# Patient Record
Sex: Female | Born: 1975 | Race: Black or African American | Hispanic: No | Marital: Married | Smoking: Never smoker
Health system: Southern US, Community
[De-identification: ages and names within clinical notes are randomized; demographics above are authoritative.]

## PROBLEM LIST (undated history)

## (undated) DIAGNOSIS — K760 Fatty (change of) liver, not elsewhere classified: Secondary | ICD-10-CM

## (undated) DIAGNOSIS — E669 Obesity, unspecified: Secondary | ICD-10-CM

## (undated) DIAGNOSIS — A749 Chlamydial infection, unspecified: Secondary | ICD-10-CM

## (undated) DIAGNOSIS — Z8759 Personal history of other complications of pregnancy, childbirth and the puerperium: Secondary | ICD-10-CM

## (undated) DIAGNOSIS — E0500 Thyrotoxicosis with diffuse goiter without thyrotoxic crisis or storm: Secondary | ICD-10-CM

## (undated) HISTORY — PX: WISDOM TOOTH EXTRACTION: SHX21

## (undated) HISTORY — PX: THERAPEUTIC ABORTION: SHX798

## (undated) HISTORY — DX: Fatty (change of) liver, not elsewhere classified: K76.0

## (undated) HISTORY — PX: SLEEVE GASTROPLASTY: SHX1101

---

## 2000-01-19 ENCOUNTER — Other Ambulatory Visit: Admission: RE | Admit: 2000-01-19 | Discharge: 2000-01-19 | Payer: Self-pay | Admitting: Gynecology

## 2000-01-20 ENCOUNTER — Encounter: Admission: RE | Admit: 2000-01-20 | Discharge: 2000-04-19 | Payer: Self-pay | Admitting: Gynecology

## 2000-06-17 ENCOUNTER — Encounter (HOSPITAL_COMMUNITY): Admission: RE | Admit: 2000-06-17 | Discharge: 2000-08-06 | Payer: Self-pay | Admitting: Gynecology

## 2000-08-06 ENCOUNTER — Inpatient Hospital Stay (HOSPITAL_COMMUNITY): Admission: AD | Admit: 2000-08-06 | Discharge: 2000-08-09 | Payer: Self-pay | Admitting: *Deleted

## 2000-09-22 ENCOUNTER — Other Ambulatory Visit: Admission: RE | Admit: 2000-09-22 | Discharge: 2000-09-22 | Payer: Self-pay | Admitting: Gynecology

## 2002-04-25 ENCOUNTER — Inpatient Hospital Stay (HOSPITAL_COMMUNITY): Admission: AD | Admit: 2002-04-25 | Discharge: 2002-04-25 | Payer: Self-pay | Admitting: Gynecology

## 2002-04-25 ENCOUNTER — Encounter (INDEPENDENT_AMBULATORY_CARE_PROVIDER_SITE_OTHER): Payer: Self-pay | Admitting: Specialist

## 2003-06-01 ENCOUNTER — Encounter: Payer: Self-pay | Admitting: Obstetrics and Gynecology

## 2003-06-01 ENCOUNTER — Inpatient Hospital Stay (HOSPITAL_COMMUNITY): Admission: AD | Admit: 2003-06-01 | Discharge: 2003-06-01 | Payer: Self-pay | Admitting: *Deleted

## 2003-06-05 ENCOUNTER — Inpatient Hospital Stay (HOSPITAL_COMMUNITY): Admission: AD | Admit: 2003-06-05 | Discharge: 2003-06-05 | Payer: Self-pay | Admitting: *Deleted

## 2003-06-08 ENCOUNTER — Inpatient Hospital Stay (HOSPITAL_COMMUNITY): Admission: AD | Admit: 2003-06-08 | Discharge: 2003-06-08 | Payer: Self-pay | Admitting: Obstetrics & Gynecology

## 2004-03-25 ENCOUNTER — Emergency Department (HOSPITAL_COMMUNITY): Admission: AD | Admit: 2004-03-25 | Discharge: 2004-03-25 | Payer: Self-pay | Admitting: Family Medicine

## 2004-10-30 ENCOUNTER — Other Ambulatory Visit: Admission: RE | Admit: 2004-10-30 | Discharge: 2004-10-30 | Payer: Self-pay | Admitting: Gynecology

## 2005-05-02 ENCOUNTER — Emergency Department (HOSPITAL_COMMUNITY): Admission: EM | Admit: 2005-05-02 | Discharge: 2005-05-02 | Payer: Self-pay | Admitting: Family Medicine

## 2005-07-12 ENCOUNTER — Emergency Department (HOSPITAL_COMMUNITY): Admission: EM | Admit: 2005-07-12 | Discharge: 2005-07-12 | Payer: Self-pay | Admitting: Family Medicine

## 2006-02-01 ENCOUNTER — Inpatient Hospital Stay (HOSPITAL_COMMUNITY): Admission: AD | Admit: 2006-02-01 | Discharge: 2006-02-03 | Payer: Self-pay | Admitting: Obstetrics & Gynecology

## 2006-02-01 ENCOUNTER — Ambulatory Visit: Payer: Self-pay | Admitting: Obstetrics & Gynecology

## 2006-10-24 ENCOUNTER — Emergency Department (HOSPITAL_COMMUNITY): Admission: EM | Admit: 2006-10-24 | Discharge: 2006-10-24 | Payer: Self-pay | Admitting: *Deleted

## 2008-01-04 ENCOUNTER — Emergency Department (HOSPITAL_COMMUNITY): Admission: EM | Admit: 2008-01-04 | Discharge: 2008-01-04 | Payer: Self-pay | Admitting: Family Medicine

## 2008-01-13 ENCOUNTER — Encounter (INDEPENDENT_AMBULATORY_CARE_PROVIDER_SITE_OTHER): Payer: Self-pay | Admitting: Nurse Practitioner

## 2008-01-13 ENCOUNTER — Ambulatory Visit: Payer: Self-pay | Admitting: Internal Medicine

## 2008-01-13 LAB — CONVERTED CEMR LAB
Albumin: 4.5 g/dL (ref 3.5–5.2)
CO2: 17 meq/L — ABNORMAL LOW (ref 19–32)
Calcium: 10 mg/dL (ref 8.4–10.5)
Chloride: 104 meq/L (ref 96–112)
Glucose, Bld: 229 mg/dL — ABNORMAL HIGH (ref 70–99)
Lymphocytes Relative: 41 % (ref 12–46)
Lymphs Abs: 4.5 10*3/uL — ABNORMAL HIGH (ref 0.7–4.0)
Monocytes Relative: 5 % (ref 3–12)
Neutrophils Relative %: 49 % (ref 43–77)
Platelets: 297 10*3/uL (ref 150–400)
Potassium: 4 meq/L (ref 3.5–5.3)
RBC: 5.84 M/uL — ABNORMAL HIGH (ref 3.87–5.11)
Sodium: 139 meq/L (ref 135–145)
TSH: 0.634 microintl units/mL (ref 0.350–5.50)
Total Protein: 8 g/dL (ref 6.0–8.3)
WBC: 10.8 10*3/uL — ABNORMAL HIGH (ref 4.0–10.5)

## 2008-01-16 ENCOUNTER — Encounter (INDEPENDENT_AMBULATORY_CARE_PROVIDER_SITE_OTHER): Payer: Self-pay | Admitting: Nurse Practitioner

## 2008-01-18 ENCOUNTER — Emergency Department (HOSPITAL_COMMUNITY): Admission: EM | Admit: 2008-01-18 | Discharge: 2008-01-18 | Payer: Self-pay | Admitting: Family Medicine

## 2008-01-20 ENCOUNTER — Emergency Department (HOSPITAL_COMMUNITY): Admission: EM | Admit: 2008-01-20 | Discharge: 2008-01-21 | Payer: Self-pay | Admitting: Internal Medicine

## 2008-01-24 ENCOUNTER — Emergency Department (HOSPITAL_COMMUNITY): Admission: EM | Admit: 2008-01-24 | Discharge: 2008-01-25 | Payer: Self-pay | Admitting: Emergency Medicine

## 2008-02-08 ENCOUNTER — Ambulatory Visit: Payer: Self-pay | Admitting: Family Medicine

## 2008-02-08 ENCOUNTER — Encounter (INDEPENDENT_AMBULATORY_CARE_PROVIDER_SITE_OTHER): Payer: Self-pay | Admitting: Nurse Practitioner

## 2008-02-08 LAB — CONVERTED CEMR LAB
LDL Cholesterol: 90 mg/dL (ref 0–99)
Triglycerides: 115 mg/dL (ref <150)
VLDL: 23 mg/dL (ref 0–40)

## 2008-03-14 ENCOUNTER — Encounter (INDEPENDENT_AMBULATORY_CARE_PROVIDER_SITE_OTHER): Payer: Self-pay | Admitting: Nurse Practitioner

## 2008-03-14 ENCOUNTER — Ambulatory Visit: Payer: Self-pay | Admitting: Internal Medicine

## 2008-03-14 LAB — CONVERTED CEMR LAB
ALT: 11 units/L (ref 0–35)
AST: 11 units/L (ref 0–37)
Calcium: 9.7 mg/dL (ref 8.4–10.5)
Chloride: 104 meq/L (ref 96–112)
Creatinine, Ser: 0.59 mg/dL (ref 0.40–1.20)
Potassium: 4.1 meq/L (ref 3.5–5.3)
Sodium: 140 meq/L (ref 135–145)
Total CHOL/HDL Ratio: 3.1

## 2008-05-11 ENCOUNTER — Emergency Department (HOSPITAL_COMMUNITY): Admission: EM | Admit: 2008-05-11 | Discharge: 2008-05-11 | Payer: Self-pay | Admitting: Emergency Medicine

## 2008-11-07 ENCOUNTER — Emergency Department (HOSPITAL_COMMUNITY): Admission: EM | Admit: 2008-11-07 | Discharge: 2008-11-07 | Payer: Self-pay | Admitting: Emergency Medicine

## 2009-01-10 ENCOUNTER — Emergency Department (HOSPITAL_COMMUNITY): Admission: EM | Admit: 2009-01-10 | Discharge: 2009-01-10 | Payer: Self-pay | Admitting: Family Medicine

## 2011-01-31 ENCOUNTER — Inpatient Hospital Stay (HOSPITAL_COMMUNITY)
Admission: AD | Admit: 2011-01-31 | Discharge: 2011-01-31 | Disposition: A | Discharge status: Home / Self Care | Payer: Medicaid Other | Source: Ambulatory Visit | Attending: Obstetrics and Gynecology | Admitting: Obstetrics and Gynecology

## 2011-01-31 DIAGNOSIS — O210 Mild hyperemesis gravidarum: Secondary | ICD-10-CM

## 2011-01-31 DIAGNOSIS — R197 Diarrhea, unspecified: Secondary | ICD-10-CM | POA: Insufficient documentation

## 2011-01-31 LAB — COMPREHENSIVE METABOLIC PANEL
ALT: 14 U/L (ref 0–35)
Albumin: 3.4 g/dL — ABNORMAL LOW (ref 3.5–5.2)
Alkaline Phosphatase: 77 U/L (ref 39–117)
Glucose, Bld: 268 mg/dL — ABNORMAL HIGH (ref 70–99)
Potassium: 3.7 mEq/L (ref 3.5–5.1)
Sodium: 135 mEq/L (ref 135–145)
Total Protein: 7.3 g/dL (ref 6.0–8.3)

## 2011-01-31 LAB — URINALYSIS, ROUTINE W REFLEX MICROSCOPIC
Ketones, ur: 15 mg/dL — AB
Protein, ur: NEGATIVE mg/dL
Urine Glucose, Fasting: >1000 mg/dL — AB

## 2011-01-31 LAB — POCT PREGNANCY, URINE: Preg Test, Ur: POSITIVE

## 2011-01-31 LAB — CBC
MCV: 76.2 fL — ABNORMAL LOW (ref 78.0–100.0)
Platelets: 292 10*3/uL (ref 150–400)
RBC: 5.05 MIL/uL (ref 3.87–5.11)
WBC: 11.8 10*3/uL — ABNORMAL HIGH (ref 4.0–10.5)

## 2011-01-31 LAB — URINE MICROSCOPIC-ADD ON

## 2011-02-01 LAB — URINE CULTURE
Colony Count: >=100000
Culture  Setup Time: 201202111852

## 2011-02-08 ENCOUNTER — Inpatient Hospital Stay (HOSPITAL_COMMUNITY)
Admission: AD | Admit: 2011-02-08 | Discharge: 2011-02-09 | Disposition: A | Discharge status: Home / Self Care | Payer: Medicaid Other | Source: Ambulatory Visit | Attending: Obstetrics & Gynecology | Admitting: Obstetrics & Gynecology

## 2011-02-08 DIAGNOSIS — E119 Type 2 diabetes mellitus without complications: Secondary | ICD-10-CM | POA: Insufficient documentation

## 2011-02-08 DIAGNOSIS — O24919 Unspecified diabetes mellitus in pregnancy, unspecified trimester: Secondary | ICD-10-CM | POA: Insufficient documentation

## 2011-02-08 DIAGNOSIS — O210 Mild hyperemesis gravidarum: Secondary | ICD-10-CM | POA: Insufficient documentation

## 2011-02-08 LAB — URINALYSIS, ROUTINE W REFLEX MICROSCOPIC
Hgb urine dipstick: NEGATIVE
Ketones, ur: 40 mg/dL — AB
Protein, ur: 100 mg/dL — AB
Urine Glucose, Fasting: 250 mg/dL — AB

## 2011-02-08 LAB — URINE MICROSCOPIC-ADD ON

## 2011-02-09 LAB — COMPREHENSIVE METABOLIC PANEL
ALT: 16 U/L (ref 0–35)
AST: 17 U/L (ref 0–37)
Albumin: 3.5 g/dL (ref 3.5–5.2)
Alkaline Phosphatase: 72 U/L (ref 39–117)
GFR calc Af Amer: >60 mL/min (ref >60)
Glucose, Bld: 255 mg/dL — ABNORMAL HIGH (ref 70–99)
Potassium: 3.4 mEq/L — ABNORMAL LOW (ref 3.5–5.1)
Sodium: 136 mEq/L (ref 135–145)
Total Protein: 7.5 g/dL (ref 6.0–8.3)

## 2011-02-09 LAB — CBC
HCT: 38.5 % (ref 36.0–46.0)
RDW: 15 % (ref 11.5–15.5)
WBC: 14.1 10*3/uL — ABNORMAL HIGH (ref 4.0–10.5)

## 2011-02-10 ENCOUNTER — Observation Stay (HOSPITAL_COMMUNITY): Payer: Medicaid Other

## 2011-02-10 ENCOUNTER — Inpatient Hospital Stay (HOSPITAL_COMMUNITY): Payer: Medicaid Other

## 2011-02-10 ENCOUNTER — Encounter (HOSPITAL_COMMUNITY): Payer: Self-pay | Admitting: Radiology

## 2011-02-10 ENCOUNTER — Inpatient Hospital Stay (HOSPITAL_COMMUNITY)
Admission: AD | Admit: 2011-02-10 | Discharge: 2011-02-18 | DRG: 781 | Disposition: A | Discharge status: Home Health | Payer: Medicaid Other | Source: Ambulatory Visit | Attending: Obstetrics & Gynecology | Admitting: Obstetrics & Gynecology

## 2011-02-10 DIAGNOSIS — O10019 Pre-existing essential hypertension complicating pregnancy, unspecified trimester: Secondary | ICD-10-CM | POA: Diagnosis present

## 2011-02-10 DIAGNOSIS — E876 Hypokalemia: Secondary | ICD-10-CM | POA: Diagnosis present

## 2011-02-10 DIAGNOSIS — O210 Mild hyperemesis gravidarum: Principal | ICD-10-CM | POA: Diagnosis present

## 2011-02-10 DIAGNOSIS — O24919 Unspecified diabetes mellitus in pregnancy, unspecified trimester: Secondary | ICD-10-CM | POA: Diagnosis present

## 2011-02-10 DIAGNOSIS — E119 Type 2 diabetes mellitus without complications: Secondary | ICD-10-CM | POA: Diagnosis present

## 2011-02-10 LAB — URINALYSIS, ROUTINE W REFLEX MICROSCOPIC
Hgb urine dipstick: NEGATIVE
Leukocytes, UA: NEGATIVE
Protein, ur: 100 mg/dL — AB
Specific Gravity, Urine: >1.03 — ABNORMAL HIGH (ref 1.005–1.030)
Urine Glucose, Fasting: 500 mg/dL — AB
Urobilinogen, UA: 0.2 mg/dL (ref 0.0–1.0)

## 2011-02-10 LAB — CBC
HCT: 42.3 % (ref 36.0–46.0)
Hemoglobin: 14.3 g/dL (ref 12.0–15.0)
MCH: 25.7 pg — ABNORMAL LOW (ref 26.0–34.0)
MCHC: 33.8 g/dL (ref 30.0–36.0)
MCV: 76.1 fL — ABNORMAL LOW (ref 78.0–100.0)
Platelets: 337 K/uL (ref 150–400)
RBC: 5.56 MIL/uL — ABNORMAL HIGH (ref 3.87–5.11)
RDW: 14.4 % (ref 11.5–15.5)
WBC: 18 K/uL — ABNORMAL HIGH (ref 4.0–10.5)

## 2011-02-10 LAB — GLUCOSE, CAPILLARY
Glucose-Capillary: 256 mg/dL — ABNORMAL HIGH (ref 70–99)
Glucose-Capillary: 259 mg/dL — ABNORMAL HIGH (ref 70–99)
Glucose-Capillary: 242 mg/dL — ABNORMAL HIGH (ref 70–99)
Glucose-Capillary: 225 mg/dL — ABNORMAL HIGH (ref 70–99)
Glucose-Capillary: 185 mg/dL — ABNORMAL HIGH (ref 70–99)
Glucose-Capillary: 148 mg/dL — ABNORMAL HIGH (ref 70–99)
Glucose-Capillary: 182 mg/dL — ABNORMAL HIGH (ref 70–99)
Glucose-Capillary: 176 mg/dL — ABNORMAL HIGH (ref 70–99)

## 2011-02-10 LAB — COMPREHENSIVE METABOLIC PANEL WITH GFR
ALT: 18 U/L (ref 0–35)
AST: 16 U/L (ref 0–37)
Albumin: 3.9 g/dL (ref 3.5–5.2)
Alkaline Phosphatase: 82 U/L (ref 39–117)
BUN: 9 mg/dL (ref 6–23)
CO2: 18 meq/L — ABNORMAL LOW (ref 19–32)
Calcium: 9.6 mg/dL (ref 8.4–10.5)
Chloride: 101 meq/L (ref 96–112)
Creatinine, Ser: 0.79 mg/dL (ref 0.4–1.2)
GFR calc non Af Amer: >60 mL/min
Glucose, Bld: 284 mg/dL — ABNORMAL HIGH (ref 70–99)
Potassium: 3.4 meq/L — ABNORMAL LOW (ref 3.5–5.1)
Sodium: 134 meq/L — ABNORMAL LOW (ref 135–145)
Total Bilirubin: 1.6 mg/dL — ABNORMAL HIGH (ref 0.3–1.2)
Total Protein: 8.5 g/dL — ABNORMAL HIGH (ref 6.0–8.3)

## 2011-02-10 LAB — URINE MICROSCOPIC-ADD ON

## 2011-02-11 LAB — GLUCOSE, CAPILLARY
Glucose-Capillary: 112 mg/dL — ABNORMAL HIGH (ref 70–99)
Glucose-Capillary: 112 mg/dL — ABNORMAL HIGH (ref 70–99)
Glucose-Capillary: 114 mg/dL — ABNORMAL HIGH (ref 70–99)
Glucose-Capillary: 121 mg/dL — ABNORMAL HIGH (ref 70–99)
Glucose-Capillary: 125 mg/dL — ABNORMAL HIGH (ref 70–99)
Glucose-Capillary: 107 mg/dL — ABNORMAL HIGH (ref 70–99)
Glucose-Capillary: 90 mg/dL (ref 70–99)
Glucose-Capillary: 88 mg/dL (ref 70–99)
Glucose-Capillary: 109 mg/dL — ABNORMAL HIGH (ref 70–99)
Glucose-Capillary: 97 mg/dL (ref 70–99)
Glucose-Capillary: 103 mg/dL — ABNORMAL HIGH (ref 70–99)
Glucose-Capillary: 106 mg/dL — ABNORMAL HIGH (ref 70–99)

## 2011-02-12 LAB — GLUCOSE, CAPILLARY
Glucose-Capillary: 103 mg/dL — ABNORMAL HIGH (ref 70–99)
Glucose-Capillary: 108 mg/dL — ABNORMAL HIGH (ref 70–99)
Glucose-Capillary: 124 mg/dL — ABNORMAL HIGH (ref 70–99)
Glucose-Capillary: 83 mg/dL (ref 70–99)
Glucose-Capillary: 83 mg/dL (ref 70–99)
Glucose-Capillary: 92 mg/dL (ref 70–99)
Glucose-Capillary: 92 mg/dL (ref 70–99)
Glucose-Capillary: 94 mg/dL (ref 70–99)
Glucose-Capillary: 97 mg/dL (ref 70–99)
Glucose-Capillary: 98 mg/dL (ref 70–99)
Glucose-Capillary: 99 mg/dL (ref 70–99)
Glucose-Capillary: 99 mg/dL (ref 70–99)
Glucose-Capillary: 99 mg/dL (ref 70–99)

## 2011-02-12 LAB — BASIC METABOLIC PANEL
BUN: 4 mg/dL — ABNORMAL LOW (ref 6–23)
BUN: 3 mg/dL — ABNORMAL LOW (ref 6–23)
CO2: 23 mEq/L (ref 19–32)
CO2: 19 mEq/L (ref 19–32)
Calcium: 8.1 mg/dL — ABNORMAL LOW (ref 8.4–10.5)
Chloride: 115 mEq/L — ABNORMAL HIGH (ref 96–112)
Chloride: 120 mEq/L — ABNORMAL HIGH (ref 96–112)
Creatinine, Ser: 0.55 mg/dL (ref 0.4–1.2)
Creatinine, Ser: 0.52 mg/dL (ref 0.4–1.2)
Glucose, Bld: 83 mg/dL (ref 70–99)
Glucose, Bld: 86 mg/dL (ref 70–99)
Potassium: 2.8 mEq/L — ABNORMAL LOW (ref 3.5–5.1)

## 2011-02-13 LAB — BASIC METABOLIC PANEL
BUN: 1 mg/dL — ABNORMAL LOW (ref 6–23)
Calcium: 7.6 mg/dL — ABNORMAL LOW (ref 8.4–10.5)
Chloride: 115 mEq/L — ABNORMAL HIGH (ref 96–112)
Creatinine, Ser: 0.48 mg/dL (ref 0.4–1.2)
GFR calc non Af Amer: >60 mL/min (ref >60)
GFR calc non Af Amer: >60 mL/min (ref >60)
Glucose, Bld: 95 mg/dL (ref 70–99)
Potassium: 2.9 mEq/L — ABNORMAL LOW (ref 3.5–5.1)
Potassium: 2.9 mEq/L — ABNORMAL LOW (ref 3.5–5.1)
Sodium: 141 mEq/L (ref 135–145)

## 2011-02-13 LAB — GLUCOSE, CAPILLARY
Glucose-Capillary: 84 mg/dL (ref 70–99)
Glucose-Capillary: 90 mg/dL (ref 70–99)
Glucose-Capillary: 101 mg/dL — ABNORMAL HIGH (ref 70–99)
Glucose-Capillary: 95 mg/dL (ref 70–99)
Glucose-Capillary: 100 mg/dL — ABNORMAL HIGH (ref 70–99)
Glucose-Capillary: 93 mg/dL (ref 70–99)
Glucose-Capillary: 91 mg/dL (ref 70–99)
Glucose-Capillary: 86 mg/dL (ref 70–99)
Glucose-Capillary: 97 mg/dL (ref 70–99)
Glucose-Capillary: 97 mg/dL (ref 70–99)
Glucose-Capillary: 93 mg/dL (ref 70–99)
Glucose-Capillary: 107 mg/dL — ABNORMAL HIGH (ref 70–99)
Glucose-Capillary: 94 mg/dL (ref 70–99)

## 2011-02-13 LAB — MAGNESIUM: Magnesium: 1.5 mg/dL (ref 1.5–2.5)

## 2011-02-14 LAB — GLUCOSE, CAPILLARY
Glucose-Capillary: 109 mg/dL — ABNORMAL HIGH (ref 70–99)
Glucose-Capillary: 118 mg/dL — ABNORMAL HIGH (ref 70–99)
Glucose-Capillary: 128 mg/dL — ABNORMAL HIGH (ref 70–99)
Glucose-Capillary: 155 mg/dL — ABNORMAL HIGH (ref 70–99)
Glucose-Capillary: 88 mg/dL (ref 70–99)
Glucose-Capillary: 89 mg/dL (ref 70–99)
Glucose-Capillary: 92 mg/dL (ref 70–99)
Glucose-Capillary: 95 mg/dL (ref 70–99)
Glucose-Capillary: 98 mg/dL (ref 70–99)
Glucose-Capillary: 98 mg/dL (ref 70–99)

## 2011-02-14 LAB — COMPREHENSIVE METABOLIC PANEL
ALT: 11 U/L (ref 0–35)
AST: 11 U/L (ref 0–37)
Alkaline Phosphatase: 63 U/L (ref 39–117)
CO2: 20 mEq/L (ref 19–32)
GFR calc Af Amer: >60 mL/min (ref >60)
Glucose, Bld: 98 mg/dL (ref 70–99)
Potassium: 3.1 mEq/L — ABNORMAL LOW (ref 3.5–5.1)
Sodium: 138 mEq/L (ref 135–145)
Total Protein: 5.4 g/dL — ABNORMAL LOW (ref 6.0–8.3)

## 2011-02-14 LAB — MAGNESIUM: Magnesium: 1.1 mg/dL — ABNORMAL LOW (ref 1.5–2.5)

## 2011-02-15 DIAGNOSIS — O210 Mild hyperemesis gravidarum: Secondary | ICD-10-CM

## 2011-02-15 DIAGNOSIS — E876 Hypokalemia: Secondary | ICD-10-CM

## 2011-02-15 DIAGNOSIS — O24919 Unspecified diabetes mellitus in pregnancy, unspecified trimester: Secondary | ICD-10-CM

## 2011-02-15 DIAGNOSIS — E119 Type 2 diabetes mellitus without complications: Secondary | ICD-10-CM

## 2011-02-15 LAB — GLUCOSE, CAPILLARY
Glucose-Capillary: 128 mg/dL — ABNORMAL HIGH (ref 70–99)
Glucose-Capillary: 122 mg/dL — ABNORMAL HIGH (ref 70–99)
Glucose-Capillary: 114 mg/dL — ABNORMAL HIGH (ref 70–99)
Glucose-Capillary: 106 mg/dL — ABNORMAL HIGH (ref 70–99)
Glucose-Capillary: 98 mg/dL (ref 70–99)
Glucose-Capillary: 119 mg/dL — ABNORMAL HIGH (ref 70–99)
Glucose-Capillary: 111 mg/dL — ABNORMAL HIGH (ref 70–99)
Glucose-Capillary: 153 mg/dL — ABNORMAL HIGH (ref 70–99)
Glucose-Capillary: 157 mg/dL — ABNORMAL HIGH (ref 70–99)
Glucose-Capillary: 130 mg/dL — ABNORMAL HIGH (ref 70–99)
Glucose-Capillary: 148 mg/dL — ABNORMAL HIGH (ref 70–99)
Glucose-Capillary: 113 mg/dL — ABNORMAL HIGH (ref 70–99)

## 2011-02-16 LAB — GLUCOSE, CAPILLARY
Glucose-Capillary: 108 mg/dL — ABNORMAL HIGH (ref 70–99)
Glucose-Capillary: 116 mg/dL — ABNORMAL HIGH (ref 70–99)
Glucose-Capillary: 122 mg/dL — ABNORMAL HIGH (ref 70–99)
Glucose-Capillary: 126 mg/dL — ABNORMAL HIGH (ref 70–99)
Glucose-Capillary: 144 mg/dL — ABNORMAL HIGH (ref 70–99)
Glucose-Capillary: 84 mg/dL (ref 70–99)
Glucose-Capillary: 89 mg/dL (ref 70–99)
Glucose-Capillary: 89 mg/dL (ref 70–99)
Glucose-Capillary: 92 mg/dL (ref 70–99)
Glucose-Capillary: 95 mg/dL (ref 70–99)
Glucose-Capillary: 95 mg/dL (ref 70–99)

## 2011-02-16 LAB — DIFFERENTIAL
Basophils Relative: 0 % (ref 0–1)
Lymphocytes Relative: 17 % (ref 12–46)
Lymphs Abs: 2.3 10*3/uL (ref 0.7–4.0)
Monocytes Absolute: 0.8 10*3/uL (ref 0.1–1.0)
Monocytes Relative: 6 % (ref 3–12)
Neutro Abs: 10.4 10*3/uL — ABNORMAL HIGH (ref 1.7–7.7)

## 2011-02-16 LAB — HEMOGLOBIN A1C: Mean Plasma Glucose: 209 mg/dL — ABNORMAL HIGH (ref <117)

## 2011-02-16 LAB — CBC
HCT: 34 % — ABNORMAL LOW (ref 36.0–46.0)
Hemoglobin: 11.2 g/dL — ABNORMAL LOW (ref 12.0–15.0)
MCH: 25 pg — ABNORMAL LOW (ref 26.0–34.0)
MCHC: 32.9 g/dL (ref 30.0–36.0)
MCV: 75.9 fL — ABNORMAL LOW (ref 78.0–100.0)

## 2011-02-16 LAB — TYPE AND SCREEN
ABO/RH(D): O POS
Antibody Screen: NEGATIVE

## 2011-02-16 LAB — URINALYSIS, MICROSCOPIC ONLY
Bilirubin Urine: NEGATIVE
Ketones, ur: NEGATIVE mg/dL
Specific Gravity, Urine: 1.02 (ref 1.005–1.030)
Urine Glucose, Fasting: NEGATIVE mg/dL
pH: 6 (ref 5.0–8.0)

## 2011-02-16 LAB — BASIC METABOLIC PANEL
CO2: 20 mEq/L (ref 19–32)
Calcium: 7.6 mg/dL — ABNORMAL LOW (ref 8.4–10.5)
Chloride: 108 mEq/L (ref 96–112)
Creatinine, Ser: 0.49 mg/dL (ref 0.4–1.2)
Creatinine, Ser: 0.44 mg/dL (ref 0.4–1.2)
GFR calc Af Amer: >60 mL/min (ref >60)
GFR calc Af Amer: >60 mL/min (ref >60)
GFR calc non Af Amer: >60 mL/min (ref >60)
GFR calc non Af Amer: >60 mL/min (ref >60)
Sodium: 135 mEq/L (ref 135–145)

## 2011-02-16 LAB — ABO/RH

## 2011-02-16 LAB — MAGNESIUM: Magnesium: 1.3 mg/dL — ABNORMAL LOW (ref 1.5–2.5)

## 2011-02-16 LAB — RPR: RPR Ser Ql: NONREACTIVE

## 2011-02-17 LAB — BASIC METABOLIC PANEL
CO2: 23 mEq/L (ref 19–32)
Chloride: 105 mEq/L (ref 96–112)
Creatinine, Ser: 0.57 mg/dL (ref 0.4–1.2)
GFR calc Af Amer: >60 mL/min (ref >60)
Glucose, Bld: 131 mg/dL — ABNORMAL HIGH (ref 70–99)
Sodium: 135 mEq/L (ref 135–145)

## 2011-02-17 LAB — PROTEIN, URINE, 24 HOUR
Collection Interval-UPROT: 24 hours
Protein, Urine: 4 mg/dL
Urine Total Volume-UPROT: 1975 mL

## 2011-02-17 LAB — GLUCOSE, CAPILLARY: Glucose-Capillary: 224 mg/dL — ABNORMAL HIGH (ref 70–99)

## 2011-02-19 LAB — URINE CULTURE
Colony Count: >=100000
Culture  Setup Time: 201202271518
Special Requests: NEGATIVE

## 2011-02-23 ENCOUNTER — Other Ambulatory Visit: Payer: Self-pay | Admitting: Family Medicine

## 2011-02-23 ENCOUNTER — Encounter: Payer: Self-pay | Admitting: Family Medicine

## 2011-02-23 ENCOUNTER — Encounter: Payer: Medicaid Other | Attending: Obstetrics & Gynecology | Admitting: Dietician

## 2011-02-23 DIAGNOSIS — O24919 Unspecified diabetes mellitus in pregnancy, unspecified trimester: Secondary | ICD-10-CM

## 2011-02-23 DIAGNOSIS — O99810 Abnormal glucose complicating pregnancy: Secondary | ICD-10-CM | POA: Insufficient documentation

## 2011-02-23 DIAGNOSIS — Z713 Dietary counseling and surveillance: Secondary | ICD-10-CM | POA: Insufficient documentation

## 2011-02-23 LAB — CONVERTED CEMR LAB
Chlamydia, DNA Probe: NEGATIVE
Free T4: 1.2 ng/dL (ref 0.80–1.80)
GC Probe Amp, Genital: NEGATIVE
T3, Free: 2.5 pg/mL (ref 2.3–4.2)

## 2011-02-26 ENCOUNTER — Ambulatory Visit (HOSPITAL_COMMUNITY)
Admission: RE | Admit: 2011-02-26 | Discharge: 2011-02-26 | Disposition: A | Discharge status: Home / Self Care | Payer: Medicaid Other | Source: Ambulatory Visit | Attending: Obstetrics & Gynecology | Admitting: Obstetrics & Gynecology

## 2011-02-26 ENCOUNTER — Other Ambulatory Visit: Payer: Self-pay | Admitting: Obstetrics & Gynecology

## 2011-02-26 DIAGNOSIS — Z3682 Encounter for antenatal screening for nuchal translucency: Secondary | ICD-10-CM

## 2011-02-27 NOTE — Discharge Summary (Signed)
Anna Hawkins, Anna Hawkins             ACCOUNT NO.:  [id number]  MEDICAL RECORD NO.:  [id number]           PATIENT TYPE:  I  LOCATION:  9318                          [facility]ILITY:  [facility]  PHYSICIAN:  Scheryl Darter, MD       DATE OF BIRTH:  [birth year]  DATE OF ADMISSION:  02/10/2011 DATE OF DISCHARGE:  02/18/2011                              DISCHARGE SUMMARY   ADMISSION DIAGNOSES: 1. Intrauterine pregnancy at 10.2 weeks. 2. Nausea and vomiting, hyperemesis. 3. Chronic hypertension. 4. Type 2 diabetes, uncontrolled glucose levels.  DISCHARGE DIAGNOSES: 1. Intrauterine pregnancy at 10.2 weeks. 2. Nausea and vomiting, hyperemesis. 3. Chronic hypertension. 4. Type 2 diabetes, uncontrolled glucose levels. 5. Improved hypertension and improved blood glucose levels  HOSPITAL COURSE:  The patient was admitted with nausea, vomiting, unable to keep food or fluids down.  She was found to have elevated blood sugars in the 200s range and was therefore admitted for hyperemesis protocol and blood sugar management.  She had not acquired prenatal care yet.  She is a G7, para 2 with a history of gestational diabetes and gestational hypertension and it appears that she is actually a chronic hypertensive and chronic diabetic as she was taking oral anti- hyperglycemics between pregnancies.  She was given medication for her nausea, placed on IV fluids, and diet was advanced as tolerated.  She was unable in the first couple of days to keep her fluids down, so she was started on IV insulin.  On hospital day #2, she was still unable to tolerate food or fluids.  She was not having any pain.  Blood pressures were 140s/80s.  Glucose values ranged in the low 100s to upper 90s. Urine ketones were negative.  She was hypokalemic at 2.5 and potassium was supplemented.  She continued on her insulin drip and glucose levels started to stabilize on February 12, 2011.  On February 13, 2011, she was still having nausea,  vomiting, and unable keep things down.  Blood pressures were ranging 140s/80s.  Blood sugars were 98-108.  Potassium was 2.9 and magnesium was 1.5.  Potassium was supplemented.  She was treated with Reglan and Phenergan as well as scopolamine and Zofran. She had some constipation that was treated with MiraLax.  Reflux was treated with Protonix and Zantac.  On February 14, 2011, she was able to tolerate some water and starting to get hungry.  Blood sugars were 80s to low 100s.  LFTs were normal.  Potassium was up to 3.1.  Social Work saw her on that day to try to get her Medicaid arranged.  On February 15, 2011, she was able to drink ginger ale.  Sugars were less than 120 and glucose stabilizer was continued.  On February 16, 2011, she was feeling much better and vomiting much less.  She was starting to eat food and fluids.  Blood pressures remained in 140s/85.  Blood sugars were around the 84-108 range.  Potassium was 2.9 and was supplemented. She transitioned over to NPH NovoLog to compensate for taking a diet and on February 17, 2011, blood pressures were 112-163/64-84, blood sugars were 105-144.  She was  started on labetalol 200 mg twice a day for hypertension.  She had insulin teaching done on February 18, 2011.  She was tolerating p.o. well.  She was utilizing NPH insulin 18 units in the morning and at night with a sliding scale.  Blood pressures were 149/86 and 163/83.  Blood glucoses 106-224.  Decision was made that she had received full benefit of her hospital stay and she was discharged home. Social Work and Case Management were working on her [employer] as well as obtaining her a blood glucose meter for use at home.  She was already demonstrating ability to give herself insulin injections and will do that at home.  We will get her into the [facility] on Monday for further education with the diabetes nurse.  CONDITION AT DISCHARGE:  Good.  DISCHARGE LABORATORY  DATA:  GC Chlamydia negative.  Glucose 180 and potassium 3.2.  White blood cell 13.5, hemoglobin 11.2, and platelets 264.  DISCHARGE MEDICATIONS: 1. Aldomet 500 mg p.o. q.12 h. due to lower cost. 2. NPH insulin 18 units q.a.m. and q.p.m.Marland Kitchen 3. Zofran 8 mg p.o. q.8 h. p.r.n. 4. Prilosec 20 mg p.o. daily.  DISCHARGE INSTRUCTIONS:  Monitoring of blood glucose and administration of insulin at home and discharge followup will occur on Monday, February 23, 2011, at 8:38 a.m.     Anna Hawkins, C.N.M.   ______________________________ Scheryl Darter, MD    MLW/MEDQ  D:  02/18/2011  T:  02/18/2011  Job:  [id number]  Electronically Signed by Wynelle Bourgeois C.N.M. on 02/19/2011 11:56:17 AM Electronically Signed by Scheryl Darter MD on 02/26/2011 01:53:56 PM

## 2011-03-02 ENCOUNTER — Encounter: Payer: Medicaid Other | Admitting: Dietician

## 2011-03-02 ENCOUNTER — Other Ambulatory Visit: Payer: Self-pay

## 2011-03-02 DIAGNOSIS — O169 Unspecified maternal hypertension, unspecified trimester: Secondary | ICD-10-CM

## 2011-03-02 DIAGNOSIS — O24919 Unspecified diabetes mellitus in pregnancy, unspecified trimester: Secondary | ICD-10-CM

## 2011-03-02 LAB — POCT URINALYSIS DIPSTICK
Bilirubin Urine: NEGATIVE
Glucose, UA: NEGATIVE mg/dL
Hgb urine dipstick: NEGATIVE
Specific Gravity, Urine: >=1.03 (ref 1.005–1.030)
pH: 5.5 (ref 5.0–8.0)

## 2011-03-03 ENCOUNTER — Ambulatory Visit (HOSPITAL_COMMUNITY)
Admission: RE | Admit: 2011-03-03 | Discharge: 2011-03-03 | Disposition: A | Discharge status: Home / Self Care | Payer: Medicaid Other | Source: Ambulatory Visit | Attending: Obstetrics & Gynecology | Admitting: Obstetrics & Gynecology

## 2011-03-03 ENCOUNTER — Other Ambulatory Visit: Payer: Self-pay | Admitting: Obstetrics & Gynecology

## 2011-03-03 ENCOUNTER — Ambulatory Visit (HOSPITAL_COMMUNITY): Payer: Medicaid Other

## 2011-03-03 DIAGNOSIS — Z0489 Encounter for examination and observation for other specified reasons: Secondary | ICD-10-CM

## 2011-03-03 DIAGNOSIS — O24919 Unspecified diabetes mellitus in pregnancy, unspecified trimester: Secondary | ICD-10-CM | POA: Insufficient documentation

## 2011-03-03 DIAGNOSIS — O3510X0 Maternal care for (suspected) chromosomal abnormality in fetus, unspecified, not applicable or unspecified: Secondary | ICD-10-CM | POA: Insufficient documentation

## 2011-03-03 DIAGNOSIS — Z3689 Encounter for other specified antenatal screening: Secondary | ICD-10-CM | POA: Insufficient documentation

## 2011-03-03 DIAGNOSIS — O09299 Supervision of pregnancy with other poor reproductive or obstetric history, unspecified trimester: Secondary | ICD-10-CM | POA: Insufficient documentation

## 2011-03-03 DIAGNOSIS — O10019 Pre-existing essential hypertension complicating pregnancy, unspecified trimester: Secondary | ICD-10-CM | POA: Insufficient documentation

## 2011-03-03 DIAGNOSIS — O09529 Supervision of elderly multigravida, unspecified trimester: Secondary | ICD-10-CM | POA: Insufficient documentation

## 2011-03-03 DIAGNOSIS — E669 Obesity, unspecified: Secondary | ICD-10-CM | POA: Insufficient documentation

## 2011-03-03 DIAGNOSIS — O351XX0 Maternal care for (suspected) chromosomal abnormality in fetus, not applicable or unspecified: Secondary | ICD-10-CM | POA: Insufficient documentation

## 2011-03-03 DIAGNOSIS — Z3682 Encounter for antenatal screening for nuchal translucency: Secondary | ICD-10-CM

## 2011-03-16 ENCOUNTER — Encounter: Payer: Medicaid Other | Attending: Obstetrics & Gynecology | Admitting: Dietician

## 2011-03-16 DIAGNOSIS — Z713 Dietary counseling and surveillance: Secondary | ICD-10-CM | POA: Insufficient documentation

## 2011-03-16 DIAGNOSIS — Z331 Pregnant state, incidental: Secondary | ICD-10-CM

## 2011-03-16 DIAGNOSIS — O169 Unspecified maternal hypertension, unspecified trimester: Secondary | ICD-10-CM

## 2011-03-16 DIAGNOSIS — O99810 Abnormal glucose complicating pregnancy: Secondary | ICD-10-CM | POA: Insufficient documentation

## 2011-03-16 DIAGNOSIS — O200 Threatened abortion: Secondary | ICD-10-CM

## 2011-03-23 ENCOUNTER — Encounter: Payer: Medicaid Other | Attending: Obstetrics & Gynecology | Admitting: Dietician

## 2011-03-23 DIAGNOSIS — Z331 Pregnant state, incidental: Secondary | ICD-10-CM

## 2011-03-23 DIAGNOSIS — O99210 Obesity complicating pregnancy, unspecified trimester: Secondary | ICD-10-CM

## 2011-03-23 DIAGNOSIS — O24919 Unspecified diabetes mellitus in pregnancy, unspecified trimester: Secondary | ICD-10-CM

## 2011-03-23 DIAGNOSIS — Z713 Dietary counseling and surveillance: Secondary | ICD-10-CM | POA: Insufficient documentation

## 2011-03-23 DIAGNOSIS — O169 Unspecified maternal hypertension, unspecified trimester: Secondary | ICD-10-CM

## 2011-03-23 DIAGNOSIS — E669 Obesity, unspecified: Secondary | ICD-10-CM

## 2011-03-23 DIAGNOSIS — O210 Mild hyperemesis gravidarum: Secondary | ICD-10-CM

## 2011-03-23 DIAGNOSIS — O99810 Abnormal glucose complicating pregnancy: Secondary | ICD-10-CM | POA: Insufficient documentation

## 2011-04-06 DIAGNOSIS — O169 Unspecified maternal hypertension, unspecified trimester: Secondary | ICD-10-CM

## 2011-04-06 DIAGNOSIS — O99210 Obesity complicating pregnancy, unspecified trimester: Secondary | ICD-10-CM

## 2011-04-06 DIAGNOSIS — E669 Obesity, unspecified: Secondary | ICD-10-CM

## 2011-04-06 DIAGNOSIS — Z331 Pregnant state, incidental: Secondary | ICD-10-CM

## 2011-04-06 DIAGNOSIS — O09529 Supervision of elderly multigravida, unspecified trimester: Secondary | ICD-10-CM

## 2011-04-06 DIAGNOSIS — O24919 Unspecified diabetes mellitus in pregnancy, unspecified trimester: Secondary | ICD-10-CM

## 2011-04-13 DIAGNOSIS — O99810 Abnormal glucose complicating pregnancy: Secondary | ICD-10-CM

## 2011-04-13 DIAGNOSIS — Z331 Pregnant state, incidental: Secondary | ICD-10-CM

## 2011-04-13 DIAGNOSIS — O169 Unspecified maternal hypertension, unspecified trimester: Secondary | ICD-10-CM

## 2011-04-14 ENCOUNTER — Ambulatory Visit (HOSPITAL_COMMUNITY)
Admission: RE | Admit: 2011-04-14 | Discharge: 2011-04-14 | Disposition: A | Discharge status: Home / Self Care | Payer: Medicaid Other | Source: Ambulatory Visit | Attending: Obstetrics & Gynecology | Admitting: Obstetrics & Gynecology

## 2011-04-14 DIAGNOSIS — O24919 Unspecified diabetes mellitus in pregnancy, unspecified trimester: Secondary | ICD-10-CM | POA: Insufficient documentation

## 2011-04-14 DIAGNOSIS — Z1389 Encounter for screening for other disorder: Secondary | ICD-10-CM | POA: Insufficient documentation

## 2011-04-14 DIAGNOSIS — O358XX0 Maternal care for other (suspected) fetal abnormality and damage, not applicable or unspecified: Secondary | ICD-10-CM | POA: Insufficient documentation

## 2011-04-14 DIAGNOSIS — O210 Mild hyperemesis gravidarum: Secondary | ICD-10-CM | POA: Insufficient documentation

## 2011-04-14 DIAGNOSIS — Z0489 Encounter for examination and observation for other specified reasons: Secondary | ICD-10-CM

## 2011-04-14 DIAGNOSIS — Z363 Encounter for antenatal screening for malformations: Secondary | ICD-10-CM | POA: Insufficient documentation

## 2011-04-14 DIAGNOSIS — O09299 Supervision of pregnancy with other poor reproductive or obstetric history, unspecified trimester: Secondary | ICD-10-CM | POA: Insufficient documentation

## 2011-04-14 DIAGNOSIS — O10019 Pre-existing essential hypertension complicating pregnancy, unspecified trimester: Secondary | ICD-10-CM | POA: Insufficient documentation

## 2011-04-14 DIAGNOSIS — O09529 Supervision of elderly multigravida, unspecified trimester: Secondary | ICD-10-CM | POA: Insufficient documentation

## 2011-04-27 DIAGNOSIS — O24919 Unspecified diabetes mellitus in pregnancy, unspecified trimester: Secondary | ICD-10-CM

## 2011-04-27 DIAGNOSIS — O09529 Supervision of elderly multigravida, unspecified trimester: Secondary | ICD-10-CM

## 2011-04-27 DIAGNOSIS — O169 Unspecified maternal hypertension, unspecified trimester: Secondary | ICD-10-CM

## 2011-04-27 DIAGNOSIS — Z331 Pregnant state, incidental: Secondary | ICD-10-CM

## 2011-05-04 DIAGNOSIS — Z331 Pregnant state, incidental: Secondary | ICD-10-CM

## 2011-05-04 DIAGNOSIS — O99210 Obesity complicating pregnancy, unspecified trimester: Secondary | ICD-10-CM

## 2011-05-04 DIAGNOSIS — O99810 Abnormal glucose complicating pregnancy: Secondary | ICD-10-CM

## 2011-05-04 DIAGNOSIS — O169 Unspecified maternal hypertension, unspecified trimester: Secondary | ICD-10-CM

## 2011-05-04 DIAGNOSIS — E669 Obesity, unspecified: Secondary | ICD-10-CM

## 2011-05-04 DIAGNOSIS — O09529 Supervision of elderly multigravida, unspecified trimester: Secondary | ICD-10-CM

## 2011-05-08 NOTE — Discharge Summary (Signed)
Anna Hawkins, Anna Hawkins             ACCOUNT NO.:  [id number]   MEDICAL RECORD NO.:  [id number]          PATIENT TYPE:  INP   LOCATION:  9320                          [facility]ILITY:  [facility]   PHYSICIAN:  Tracy L. Mayford Knife, M.D.DATE OF BIRTH:  [birth year]   DATE OF ADMISSION:  02/01/2006  DATE OF DISCHARGE:  02/03/2006                                 DISCHARGE SUMMARY   DISCHARGE DIAGNOSES:  1.  Hyperemesis with dehydration.  2.  Trichomonas.  3.  Type 2 diabetes.  4.  Chronic hypertension.  5.  Morbid obesity.  6.  Depressed thyroid stimulating hormone possibly secondary to nausea and      vomiting or pregnancy.   LABORATORY:  A 24-hour urine:  Total volume 1625 and total protein 179.  Wet  prep positive for trichomonas.  TSH 0.007 and free T4 1.93.  OB ultrasound  on February 12 showed a single live intrauterine pregnancy with an estimated  gestational age of [redacted] weeks 5 days, which is concordant with the LMP.   HOSPITAL COURSE:  The patient is a 35 year old G5, P2-0-2-2 at 9 weeks 6/7  days dated by an LMP of December 13 consistent with an ultrasound on the  date of admission, who presented with nausea and vomiting x1 week.  The  patient complained of vaginal discharge in addition.  Her past medical/OB  history is significant for having 2 spontaneous abortions and 2 full-term  deliveries.  The patient was admitted for workup of hyperemesis and for IV  fluids.   ISSUES DURING THE HOSPITALIZATION:  1.  Nausea and vomiting/dehydration, improved with Phenergan and IV fluids.      The patient was able to be advanced to regular diet and did well.  2.  The patient was found to have trichomonas.  She was treated with Flagyl.  3.  Diabetes.  The patient has a history of gestational diabetes with the      last pregnancy and she is morbidly obese.  She admits that she had never      been followed up for this issue.  Her blood sugars while in the hospital      were elevated, so she was actually  started on an insulin regimen of NPH      and regular insulin.  The patient was knowledgeable about how to give      herself injections because she was on injections with her last      pregnancy.  4.  Chronic hypertension.  The patient's blood pressures in the MAU was      154/82.  They ranged, during the hospitalization, anywhere from 108-      156/62-88.  This will be watched as an outpatient.  5.  Possible hyperthyroidism.  It is well known that 1 of the causes of      hyperemesis is hyperthyroidism, so thyroid studies.  Her TSH was      suppressed, but her free T4 was within normal limits.  This is to be      followed as an outpatient and it was thought to be possible be secondary  to her nausea and vomiting or her pregnant status.   DISPOSITION:  Home in stable condition.   FOLLOW UP:  Wednesday, high risk clinic the following week.   DISCHARGE MEDICATIONS:  1.  Prenatal vitamins.  2.  NPH 15 units in the a.m. and in the p.m., and 5 units of regular with      breakfast and supper.   DISCHARGE INSTRUCTIONS:  The patient is to check her blood sugars a.c. and  PC with breakfast and dinner.  Normal __________  directions were also  given.   DIET:  ADA diet given.   ACTIVITY:  Regular.           ______________________________  Marc Morgans. Mayford Knife, M.D.     TLW/MEDQ  D:  02/13/2006  T:  02/13/2006  Job:  [id number]

## 2011-05-08 NOTE — Discharge Summary (Signed)
[facility]  Patient:    Anna Hawkins, Anna Hawkins                      MRN: [id number] Adm. Date:  08/07/00 Disc. Date: 08/09/00 Attending:  Katy Fitch, M.D. Dictator:   Antony Contras, [facility]                           Discharge Summary  DISCHARGE DIAGNOSES:          1. Intrauterine pregnancy at 39 weeks.                               2. Insulin-dependent diabetes.                               3. Obesity.                               4. Transient hypertension.  PROCEDURES:                   Spontaneous vaginal delivery over second degree midline episiotomy, shoulder dystocia.  McRoberts procedure, suprapubic pressure.  HISTORY OF PRESENT ILLNESS:   The patient is a 35 year old gravida 3, para 1-0-1-1 with LMP November 05, 2000, [facility] August 14, 2000.  Pregnancy was complicated by obesity, diabetes and also a history of bacterial vaginosis and Trichomonas on new OB visit.  PRENATAL LABORATORY DATA:     Blood type O positive, antibody screen negative. Sickle cell trait negative.  RPR, HBsAG, HIV nonreactive.  Rubella immune. MSAFP within normal limits.  GBS was negative.  HOSPITAL COURSE:              The patient was admitted on August 07, 2000, with spontaneous rupture of membranes with clear fluid, contractions every two to three minutes.  Labor was augmented with Pitocin.  An IUPC was placed. Epidural anesthesia administered.  She did progress to complete dilatation and delivered an 8 pound 11 ounce female infant, Apgars 2 and 8 over a midline episiotomy.  Shoulder dystocia was noted.  McRoberts procedure, suprapubic pressure applied.  An episiotomy extension was performed.  NICU personnel did attend delivery.  Cord pH was 7.34.  Postpartum course:  The patient remained afebrile.  She had no difficulty voiding.  Post delivery CBC:  Hematocrit 34.0, hemoglobin 11.4, WBC 25.4, platelets 266,000.  The patient was able to be discharged on her  second postpartum day in satisfactory condition.  DISCHARGE MEDICATIONS:  Continue with prenatal vitamins and iron.  Motrin and Tylox for pain.  Instructed to use stool softeners until six week checkup. DD:  08/27/00 TD:  08/28/00 Job: [id number]

## 2011-05-08 NOTE — H&P (Signed)
[facility]  Patient:    Anna Hawkins, Anna Hawkins                      MRN: [id number] Adm. Date:  [id number] Disc. Date: 08/06/00 Attending:  Merrily Pew                         History and Physical  CHIEF COMPLAINT:  Abdominal pain.  BRIEF HISTORY:  The patient is a 35 year old G3, P1, AB1, at 39-4/7ths weeks by period consistent with first trimester ultrasound who presented to [facility] complaining of some contractions and some abdominal pain.  The patient has been diagnosed with gestational diabetes and has been on insulin, 40 of N, 20 of R in the morning, 23 of R at dinner time and then 40 of N at night to control her diabetes.  Her sugars have been consistently good with fastings less than 90 and 2-hour PCs less than 125.  The patient denies any rupture of membranes, vaginal bleeding, decreased fetal movement, shortness of breath, headache, or right upper quadrant pain.  PAST OB HISTORY: 1. In 1993 6 week spontaneous abortion with a D&C. 2. In 1994 an 8 pound 4 ounce vaginal delivery female without any    complications.  PAST MEDICAL HISTORY:  None.  PAST SURGICAL HISTORY:  D&C in 1993.  PAST GYNECOLOGICAL HISTORY:  History of cervical dysplasia in 1997.  No surgical therapy.  No history of sexually transmitted diseases.  MEDICATIONS: 1. Prenatal vitamins. 2. Insulin as per above regimen.  ALLERGIES:  No known drug allergies.  SOCIAL HISTORY:  The patient is currently married.  Denies any tobacco, alcohol or drug usage.  FAMILY HISTORY:  Without any epithelial cancers.  PHYSICAL EXAMINATION:  VITAL SIGNS:  Blood pressures in triage were 140s/90s with 1 elevation at 155/90.  Pulse 82.  HEENT:  Throat clear.  NECK:  Without any thyromegaly or JVD.  LUNGS:  Clear to auscultation bilaterally.  HEART:  Regular rate and rhythm without any murmurs, rubs or gallops.  ABDOMEN:  Gravid.  Fundal height 42.  Normal  abdominal bowel sounds.  EXTREMITIES:  Without any edema, clubbing, cyanosis or tonus.  PELVIC EXAM:  Cervix 1 thick and high.  Fetal heart tones 140s and reactive with a couple decelerations, variables down to the 60s but reactive strip afterwards.  Tocometer:  No contractions noted.  LABORATORY DATA:  White count 11.4, hemoglobin 12.6, hematocrit 37.1, platelets 270.  AST 14, LDH 140, creatinine 0.5.  Urinalysis was negative except for greater than 80 ketones.  PRENATAL LABORATORY DATA:  The patient is 0+, antibody negative, sickle cell trait negative, RPR nonreactive, rubella immune, hepatitis and HIV negative. MSAFP was within normal limits.  ASSESSMENT AND PLAN:  A 35 year old G3, P1, A1, at 39 weeks with Class A2 diabetes currently on insulin.  The patient with elevated blood pressures at triage with noted deceleration on strip.  Will admit patient for induction secondary to elevated blood pressures and questionable strip.  Will place Cervidil and monitor glucose every few hours.  GBS status unknown at time of dictation and will start antibiotics if positive. DD:  08/06/00 TD:  08/06/00 Job: [id number]

## 2011-05-09 ENCOUNTER — Inpatient Hospital Stay (HOSPITAL_COMMUNITY)
Admission: EM | Admit: 2011-05-09 | Discharge: 2011-05-09 | Disposition: A | Discharge status: Home / Self Care | Payer: Medicaid Other | Source: Ambulatory Visit | Attending: Obstetrics & Gynecology | Admitting: Obstetrics & Gynecology

## 2011-05-09 DIAGNOSIS — O9989 Other specified diseases and conditions complicating pregnancy, childbirth and the puerperium: Secondary | ICD-10-CM

## 2011-05-09 DIAGNOSIS — R109 Unspecified abdominal pain: Secondary | ICD-10-CM | POA: Insufficient documentation

## 2011-05-09 DIAGNOSIS — O99891 Other specified diseases and conditions complicating pregnancy: Secondary | ICD-10-CM | POA: Insufficient documentation

## 2011-05-09 LAB — URINALYSIS, ROUTINE W REFLEX MICROSCOPIC
Ketones, ur: 15 mg/dL — AB
Nitrite: NEGATIVE
Protein, ur: NEGATIVE mg/dL
Urobilinogen, UA: 0.2 mg/dL (ref 0.0–1.0)

## 2011-05-21 ENCOUNTER — Other Ambulatory Visit: Payer: Self-pay | Admitting: Obstetrics and Gynecology

## 2011-05-21 DIAGNOSIS — O24419 Gestational diabetes mellitus in pregnancy, unspecified control: Secondary | ICD-10-CM

## 2011-05-21 DIAGNOSIS — I10 Essential (primary) hypertension: Secondary | ICD-10-CM

## 2011-05-21 DIAGNOSIS — O99210 Obesity complicating pregnancy, unspecified trimester: Secondary | ICD-10-CM

## 2011-05-21 DIAGNOSIS — O169 Unspecified maternal hypertension, unspecified trimester: Secondary | ICD-10-CM

## 2011-05-21 DIAGNOSIS — E669 Obesity, unspecified: Secondary | ICD-10-CM

## 2011-05-21 DIAGNOSIS — Z331 Pregnant state, incidental: Secondary | ICD-10-CM

## 2011-05-21 DIAGNOSIS — O09529 Supervision of elderly multigravida, unspecified trimester: Secondary | ICD-10-CM

## 2011-05-21 DIAGNOSIS — O210 Mild hyperemesis gravidarum: Secondary | ICD-10-CM

## 2011-05-21 LAB — POCT URINALYSIS DIP (DEVICE)
Ketones, ur: NEGATIVE mg/dL
Nitrite: NEGATIVE
Protein, ur: 30 mg/dL — AB
Urobilinogen, UA: 0.2 mg/dL (ref 0.0–1.0)
pH: 5.5 (ref 5.0–8.0)

## 2011-05-27 ENCOUNTER — Ambulatory Visit (HOSPITAL_COMMUNITY)
Admission: RE | Admit: 2011-05-27 | Discharge: 2011-05-27 | Disposition: A | Discharge status: Home / Self Care | Payer: Medicaid Other | Source: Ambulatory Visit | Attending: Obstetrics and Gynecology | Admitting: Obstetrics and Gynecology

## 2011-05-27 DIAGNOSIS — O09299 Supervision of pregnancy with other poor reproductive or obstetric history, unspecified trimester: Secondary | ICD-10-CM | POA: Insufficient documentation

## 2011-05-27 DIAGNOSIS — O09529 Supervision of elderly multigravida, unspecified trimester: Secondary | ICD-10-CM | POA: Insufficient documentation

## 2011-05-27 DIAGNOSIS — E079 Disorder of thyroid, unspecified: Secondary | ICD-10-CM | POA: Insufficient documentation

## 2011-05-27 DIAGNOSIS — O99210 Obesity complicating pregnancy, unspecified trimester: Secondary | ICD-10-CM | POA: Insufficient documentation

## 2011-05-27 DIAGNOSIS — E669 Obesity, unspecified: Secondary | ICD-10-CM | POA: Insufficient documentation

## 2011-05-27 DIAGNOSIS — O24919 Unspecified diabetes mellitus in pregnancy, unspecified trimester: Secondary | ICD-10-CM | POA: Insufficient documentation

## 2011-05-27 DIAGNOSIS — O99280 Endocrine, nutritional and metabolic diseases complicating pregnancy, unspecified trimester: Secondary | ICD-10-CM | POA: Insufficient documentation

## 2011-05-27 DIAGNOSIS — O10019 Pre-existing essential hypertension complicating pregnancy, unspecified trimester: Secondary | ICD-10-CM | POA: Insufficient documentation

## 2011-05-27 DIAGNOSIS — O24419 Gestational diabetes mellitus in pregnancy, unspecified control: Secondary | ICD-10-CM

## 2011-05-27 DIAGNOSIS — I10 Essential (primary) hypertension: Secondary | ICD-10-CM

## 2011-06-01 DIAGNOSIS — O99210 Obesity complicating pregnancy, unspecified trimester: Secondary | ICD-10-CM

## 2011-06-01 DIAGNOSIS — O169 Unspecified maternal hypertension, unspecified trimester: Secondary | ICD-10-CM

## 2011-06-01 DIAGNOSIS — O09529 Supervision of elderly multigravida, unspecified trimester: Secondary | ICD-10-CM

## 2011-06-01 DIAGNOSIS — O99810 Abnormal glucose complicating pregnancy: Secondary | ICD-10-CM

## 2011-06-01 DIAGNOSIS — E669 Obesity, unspecified: Secondary | ICD-10-CM

## 2011-06-08 ENCOUNTER — Other Ambulatory Visit: Payer: Self-pay | Admitting: Obstetrics & Gynecology

## 2011-06-08 DIAGNOSIS — E669 Obesity, unspecified: Secondary | ICD-10-CM

## 2011-06-08 DIAGNOSIS — O24919 Unspecified diabetes mellitus in pregnancy, unspecified trimester: Secondary | ICD-10-CM

## 2011-06-08 DIAGNOSIS — O09529 Supervision of elderly multigravida, unspecified trimester: Secondary | ICD-10-CM

## 2011-06-08 DIAGNOSIS — O99210 Obesity complicating pregnancy, unspecified trimester: Secondary | ICD-10-CM

## 2011-06-08 DIAGNOSIS — O169 Unspecified maternal hypertension, unspecified trimester: Secondary | ICD-10-CM

## 2011-06-08 LAB — POCT URINALYSIS DIP (DEVICE)
Protein, ur: NEGATIVE mg/dL
Specific Gravity, Urine: >=1.03 (ref 1.005–1.030)
Urobilinogen, UA: 0.2 mg/dL (ref 0.0–1.0)

## 2011-06-22 ENCOUNTER — Other Ambulatory Visit: Payer: Self-pay | Admitting: Obstetrics and Gynecology

## 2011-06-22 ENCOUNTER — Other Ambulatory Visit: Payer: Self-pay | Admitting: Family Medicine

## 2011-06-22 DIAGNOSIS — O210 Mild hyperemesis gravidarum: Secondary | ICD-10-CM

## 2011-06-22 DIAGNOSIS — J45909 Unspecified asthma, uncomplicated: Secondary | ICD-10-CM

## 2011-06-22 DIAGNOSIS — O24919 Unspecified diabetes mellitus in pregnancy, unspecified trimester: Secondary | ICD-10-CM

## 2011-06-22 DIAGNOSIS — O169 Unspecified maternal hypertension, unspecified trimester: Secondary | ICD-10-CM

## 2011-06-22 LAB — POCT URINALYSIS DIP (DEVICE)
Hgb urine dipstick: NEGATIVE
Protein, ur: 30 mg/dL — AB
pH: 5.5 (ref 5.0–8.0)

## 2011-06-23 ENCOUNTER — Ambulatory Visit (HOSPITAL_COMMUNITY)
Admission: RE | Admit: 2011-06-23 | Discharge: 2011-06-23 | Disposition: A | Discharge status: Home / Self Care | Payer: Medicaid Other | Source: Ambulatory Visit | Attending: Family Medicine | Admitting: Family Medicine

## 2011-06-23 DIAGNOSIS — O24919 Unspecified diabetes mellitus in pregnancy, unspecified trimester: Secondary | ICD-10-CM

## 2011-06-23 DIAGNOSIS — O99810 Abnormal glucose complicating pregnancy: Secondary | ICD-10-CM | POA: Insufficient documentation

## 2011-06-23 DIAGNOSIS — E669 Obesity, unspecified: Secondary | ICD-10-CM | POA: Insufficient documentation

## 2011-06-23 DIAGNOSIS — O10019 Pre-existing essential hypertension complicating pregnancy, unspecified trimester: Secondary | ICD-10-CM | POA: Insufficient documentation

## 2011-06-29 ENCOUNTER — Other Ambulatory Visit: Payer: Self-pay | Admitting: Obstetrics and Gynecology

## 2011-06-29 ENCOUNTER — Other Ambulatory Visit: Payer: Self-pay | Admitting: Obstetrics & Gynecology

## 2011-06-29 DIAGNOSIS — O169 Unspecified maternal hypertension, unspecified trimester: Secondary | ICD-10-CM

## 2011-06-29 DIAGNOSIS — O24919 Unspecified diabetes mellitus in pregnancy, unspecified trimester: Secondary | ICD-10-CM

## 2011-06-29 DIAGNOSIS — O09529 Supervision of elderly multigravida, unspecified trimester: Secondary | ICD-10-CM

## 2011-06-29 LAB — POCT URINALYSIS DIP (DEVICE)
Hgb urine dipstick: NEGATIVE
Ketones, ur: NEGATIVE mg/dL
Protein, ur: NEGATIVE mg/dL
Specific Gravity, Urine: >=1.03 (ref 1.005–1.030)
Urobilinogen, UA: 0.2 mg/dL (ref 0.0–1.0)
pH: 5.5 (ref 5.0–8.0)

## 2011-06-29 LAB — CBC
HCT: 31.5 % — ABNORMAL LOW (ref 36.0–46.0)
MCH: 24.9 pg — ABNORMAL LOW (ref 26.0–34.0)
MCHC: 32.4 g/dL (ref 30.0–36.0)
MCV: 76.8 fL — ABNORMAL LOW (ref 78.0–100.0)
RDW: 14.3 % (ref 11.5–15.5)

## 2011-07-06 ENCOUNTER — Other Ambulatory Visit: Payer: Self-pay | Admitting: Family Medicine

## 2011-07-06 DIAGNOSIS — O24419 Gestational diabetes mellitus in pregnancy, unspecified control: Secondary | ICD-10-CM

## 2011-07-06 DIAGNOSIS — E669 Obesity, unspecified: Secondary | ICD-10-CM

## 2011-07-06 DIAGNOSIS — O09529 Supervision of elderly multigravida, unspecified trimester: Secondary | ICD-10-CM

## 2011-07-06 DIAGNOSIS — O24919 Unspecified diabetes mellitus in pregnancy, unspecified trimester: Secondary | ICD-10-CM

## 2011-07-06 DIAGNOSIS — O99210 Obesity complicating pregnancy, unspecified trimester: Secondary | ICD-10-CM

## 2011-07-06 DIAGNOSIS — O169 Unspecified maternal hypertension, unspecified trimester: Secondary | ICD-10-CM

## 2011-07-13 ENCOUNTER — Other Ambulatory Visit: Payer: Self-pay | Admitting: Obstetrics & Gynecology

## 2011-07-13 ENCOUNTER — Other Ambulatory Visit: Payer: Self-pay | Admitting: Family Medicine

## 2011-07-13 ENCOUNTER — Ambulatory Visit: Payer: Medicaid Other | Admitting: *Deleted

## 2011-07-13 ENCOUNTER — Ambulatory Visit (HOSPITAL_COMMUNITY)
Admission: RE | Admit: 2011-07-13 | Discharge: 2011-07-13 | Disposition: A | Discharge status: Home / Self Care | Payer: Medicaid Other | Source: Ambulatory Visit | Attending: Family Medicine | Admitting: Family Medicine

## 2011-07-13 DIAGNOSIS — O169 Unspecified maternal hypertension, unspecified trimester: Secondary | ICD-10-CM

## 2011-07-13 DIAGNOSIS — O10019 Pre-existing essential hypertension complicating pregnancy, unspecified trimester: Secondary | ICD-10-CM | POA: Insufficient documentation

## 2011-07-13 DIAGNOSIS — O24419 Gestational diabetes mellitus in pregnancy, unspecified control: Secondary | ICD-10-CM

## 2011-07-13 DIAGNOSIS — O99210 Obesity complicating pregnancy, unspecified trimester: Secondary | ICD-10-CM | POA: Insufficient documentation

## 2011-07-13 DIAGNOSIS — E669 Obesity, unspecified: Secondary | ICD-10-CM | POA: Insufficient documentation

## 2011-07-13 DIAGNOSIS — O09299 Supervision of pregnancy with other poor reproductive or obstetric history, unspecified trimester: Secondary | ICD-10-CM | POA: Insufficient documentation

## 2011-07-13 DIAGNOSIS — O24919 Unspecified diabetes mellitus in pregnancy, unspecified trimester: Secondary | ICD-10-CM | POA: Insufficient documentation

## 2011-07-13 DIAGNOSIS — O09529 Supervision of elderly multigravida, unspecified trimester: Secondary | ICD-10-CM | POA: Insufficient documentation

## 2011-07-13 LAB — POCT URINALYSIS DIP (DEVICE)
Glucose, UA: NEGATIVE mg/dL
Hgb urine dipstick: NEGATIVE
Protein, ur: 30 mg/dL — AB
Specific Gravity, Urine: 1.025 (ref 1.005–1.030)
Urobilinogen, UA: 1 mg/dL (ref 0.0–1.0)
pH: 6 (ref 5.0–8.0)

## 2011-07-16 ENCOUNTER — Ambulatory Visit: Payer: Medicaid Other | Admitting: *Deleted

## 2011-07-16 DIAGNOSIS — O24919 Unspecified diabetes mellitus in pregnancy, unspecified trimester: Secondary | ICD-10-CM

## 2011-07-16 DIAGNOSIS — O169 Unspecified maternal hypertension, unspecified trimester: Secondary | ICD-10-CM

## 2011-07-16 DIAGNOSIS — O09529 Supervision of elderly multigravida, unspecified trimester: Secondary | ICD-10-CM

## 2011-07-20 ENCOUNTER — Other Ambulatory Visit: Payer: Self-pay | Admitting: Obstetrics & Gynecology

## 2011-07-20 ENCOUNTER — Ambulatory Visit: Payer: Medicaid Other | Admitting: *Deleted

## 2011-07-20 ENCOUNTER — Encounter: Payer: Medicaid Other | Attending: Obstetrics and Gynecology | Admitting: Dietician

## 2011-07-20 DIAGNOSIS — O169 Unspecified maternal hypertension, unspecified trimester: Secondary | ICD-10-CM

## 2011-07-20 DIAGNOSIS — O09529 Supervision of elderly multigravida, unspecified trimester: Secondary | ICD-10-CM

## 2011-07-20 DIAGNOSIS — O24919 Unspecified diabetes mellitus in pregnancy, unspecified trimester: Secondary | ICD-10-CM

## 2011-07-20 LAB — POCT URINALYSIS DIP (DEVICE)
Glucose, UA: 100 mg/dL — AB
Leukocytes, UA: NEGATIVE
Nitrite: NEGATIVE
pH: 5.5 (ref 5.0–8.0)

## 2011-07-21 ENCOUNTER — Other Ambulatory Visit: Payer: Self-pay | Admitting: Advanced Practice Midwife

## 2011-07-23 ENCOUNTER — Ambulatory Visit (INDEPENDENT_AMBULATORY_CARE_PROVIDER_SITE_OTHER): Payer: Medicaid Other | Admitting: *Deleted

## 2011-07-23 DIAGNOSIS — O09529 Supervision of elderly multigravida, unspecified trimester: Secondary | ICD-10-CM

## 2011-07-23 DIAGNOSIS — O24919 Unspecified diabetes mellitus in pregnancy, unspecified trimester: Secondary | ICD-10-CM

## 2011-07-23 DIAGNOSIS — O169 Unspecified maternal hypertension, unspecified trimester: Secondary | ICD-10-CM

## 2011-07-25 NOTE — Progress Notes (Signed)
NST reactive, reviewed in [facility]

## 2011-07-27 ENCOUNTER — Ambulatory Visit (INDEPENDENT_AMBULATORY_CARE_PROVIDER_SITE_OTHER): Payer: Medicaid Other | Admitting: *Deleted

## 2011-07-27 ENCOUNTER — Other Ambulatory Visit: Payer: Self-pay | Admitting: Obstetrics & Gynecology

## 2011-07-27 DIAGNOSIS — O24919 Unspecified diabetes mellitus in pregnancy, unspecified trimester: Secondary | ICD-10-CM

## 2011-07-27 DIAGNOSIS — O169 Unspecified maternal hypertension, unspecified trimester: Secondary | ICD-10-CM

## 2011-07-27 DIAGNOSIS — Z1389 Encounter for screening for other disorder: Secondary | ICD-10-CM

## 2011-07-27 MED ORDER — INSULIN REGULAR HUMAN 100 UNIT/ML IJ SOLN: 25.0000 [IU] | Freq: Three times a day (TID) | INTRAMUSCULAR | Status: DC

## 2011-07-30 ENCOUNTER — Ambulatory Visit (INDEPENDENT_AMBULATORY_CARE_PROVIDER_SITE_OTHER): Payer: Medicaid Other | Admitting: Obstetrics & Gynecology

## 2011-07-30 ENCOUNTER — Other Ambulatory Visit: Payer: Self-pay | Admitting: Family Medicine

## 2011-07-30 DIAGNOSIS — O24919 Unspecified diabetes mellitus in pregnancy, unspecified trimester: Secondary | ICD-10-CM

## 2011-07-30 DIAGNOSIS — O09529 Supervision of elderly multigravida, unspecified trimester: Secondary | ICD-10-CM

## 2011-07-30 DIAGNOSIS — O169 Unspecified maternal hypertension, unspecified trimester: Secondary | ICD-10-CM

## 2011-07-30 LAB — COMPREHENSIVE METABOLIC PANEL
ALT: <8 U/L (ref 0–35)
Albumin: 3 g/dL — ABNORMAL LOW (ref 3.5–5.2)
CO2: 19 mEq/L (ref 19–32)
Potassium: 3.4 mEq/L — ABNORMAL LOW (ref 3.5–5.3)
Sodium: 136 mEq/L (ref 135–145)
Total Bilirubin: 0.4 mg/dL (ref 0.3–1.2)
Total Protein: 5.8 g/dL — ABNORMAL LOW (ref 6.0–8.3)

## 2011-07-30 LAB — CBC
Hemoglobin: 10 g/dL — AB (ref 12.0–16.0)
Platelets: 311 10*3/uL (ref 150–399)

## 2011-07-30 LAB — URIC ACID: Uric Acid, Serum: 3.9 mg/dL (ref 2.4–7.0)

## 2011-07-31 LAB — CBC
MCH: 24.2 pg — ABNORMAL LOW (ref 26.0–34.0)
Platelets: 311 10*3/uL (ref 150–400)
RBC: 4.13 MIL/uL (ref 3.87–5.11)
WBC: 15.1 10*3/uL — ABNORMAL HIGH (ref 4.0–10.5)

## 2011-08-03 ENCOUNTER — Ambulatory Visit (INDEPENDENT_AMBULATORY_CARE_PROVIDER_SITE_OTHER): Payer: Medicaid Other | Admitting: Family Medicine

## 2011-08-03 ENCOUNTER — Other Ambulatory Visit: Payer: Self-pay | Admitting: Obstetrics & Gynecology

## 2011-08-03 ENCOUNTER — Encounter (HOSPITAL_COMMUNITY): Payer: Self-pay | Admitting: *Deleted

## 2011-08-03 ENCOUNTER — Inpatient Hospital Stay (HOSPITAL_COMMUNITY)
Admission: AD | Admit: 2011-08-03 | Discharge: 2011-08-03 | Disposition: A | Discharge status: Home / Self Care | Payer: Medicaid Other | Source: Ambulatory Visit | Attending: Obstetrics & Gynecology | Admitting: Obstetrics & Gynecology

## 2011-08-03 ENCOUNTER — Inpatient Hospital Stay (HOSPITAL_COMMUNITY): Payer: Medicaid Other

## 2011-08-03 ENCOUNTER — Ambulatory Visit (HOSPITAL_COMMUNITY)
Admission: RE | Admit: 2011-08-03 | Discharge: 2011-08-03 | Disposition: A | Discharge status: Home / Self Care | Payer: Medicaid Other | Source: Ambulatory Visit | Attending: Obstetrics & Gynecology | Admitting: Obstetrics & Gynecology

## 2011-08-03 DIAGNOSIS — O09299 Supervision of pregnancy with other poor reproductive or obstetric history, unspecified trimester: Secondary | ICD-10-CM | POA: Insufficient documentation

## 2011-08-03 DIAGNOSIS — O24919 Unspecified diabetes mellitus in pregnancy, unspecified trimester: Secondary | ICD-10-CM | POA: Insufficient documentation

## 2011-08-03 DIAGNOSIS — O10019 Pre-existing essential hypertension complicating pregnancy, unspecified trimester: Secondary | ICD-10-CM | POA: Insufficient documentation

## 2011-08-03 DIAGNOSIS — O99280 Endocrine, nutritional and metabolic diseases complicating pregnancy, unspecified trimester: Secondary | ICD-10-CM | POA: Insufficient documentation

## 2011-08-03 DIAGNOSIS — O368390 Maternal care for abnormalities of the fetal heart rate or rhythm, unspecified trimester, not applicable or unspecified: Secondary | ICD-10-CM | POA: Insufficient documentation

## 2011-08-03 DIAGNOSIS — O169 Unspecified maternal hypertension, unspecified trimester: Secondary | ICD-10-CM

## 2011-08-03 DIAGNOSIS — E119 Type 2 diabetes mellitus without complications: Secondary | ICD-10-CM | POA: Insufficient documentation

## 2011-08-03 DIAGNOSIS — O09529 Supervision of elderly multigravida, unspecified trimester: Secondary | ICD-10-CM | POA: Insufficient documentation

## 2011-08-03 DIAGNOSIS — E079 Disorder of thyroid, unspecified: Secondary | ICD-10-CM | POA: Insufficient documentation

## 2011-08-03 DIAGNOSIS — A749 Chlamydial infection, unspecified: Secondary | ICD-10-CM | POA: Insufficient documentation

## 2011-08-03 DIAGNOSIS — O99810 Abnormal glucose complicating pregnancy: Secondary | ICD-10-CM

## 2011-08-03 DIAGNOSIS — Z1389 Encounter for screening for other disorder: Secondary | ICD-10-CM

## 2011-08-03 DIAGNOSIS — E669 Obesity, unspecified: Secondary | ICD-10-CM | POA: Insufficient documentation

## 2011-08-03 HISTORY — DX: Chlamydial infection, unspecified: A74.9

## 2011-08-03 HISTORY — DX: Obesity, unspecified: E66.9

## 2011-08-03 LAB — GLUCOSE, CAPILLARY: Glucose-Capillary: 123 mg/dL — ABNORMAL HIGH (ref 70–99)

## 2011-08-03 MED ORDER — INSULIN REGULAR HUMAN 100 UNIT/ML IJ SOLN
20.0000 [IU] | Freq: Once | INTRAMUSCULAR | Status: AC
  Administered 2011-08-03: 20 [IU] via SUBCUTANEOUS

## 2011-08-03 NOTE — ED Provider Notes (Signed)
Chief Complaint:  Non reactive NST  Anna Hawkins is  35 y.o. [id number] at [redacted]w[redacted]d presents from [facility] with non reactive NST and decels, possibly lates. Pt reports fetal movement. Denies any loss of fluid, discharge or vaginal bleeding. Denies any contractions. Denies any current headache, change in vision, increased swelling or abdominal pain. She submitted a 24hr urine this AM.  Glucose checks at home have been ranged between 108-124 fasting and 120s postprandial.  See OB history for complications during this pregnancy   Obstetrical/Gynecological History: OB History    Grav Para Term Preterm Abortions TAB SAB Ect Mult Living   7 2 2  0 4 2 2  0 0 2    1994: Term SVD PIH 2001: 99 wga, SVD, GDM 8lb10 shoulder dystocia  DM type B (reports being on glyburide and metformin at some point in 2009 after being diagnosed as pre-diabetes) Chronic hypertension Rubella non immune ASB, Ecoli Low TSH Fatty liver Echogenic foci in left cardiac ventricle calcified pap muscle on 04/24 [place]. Follow up US normal Asthma Cervix shortened 2.6cm dynamically without funneling on 07/06/11  Past Medical History: Past Medical History  Diagnosis Date  . Hypertension   . Asthma   . Chlamydia   . Obesity   . Gestational diabetes     Past Surgical History: Past Surgical History  Procedure Date  . Therapeutic abortion     Family History: Family History  Problem Relation Age of Onset  . Diabetes Mother   . Hypertension Mother   . Diabetes Brother   . Hypertension Brother     Social History: History  Substance Use Topics  . Smoking status: Never Smoker   . Smokeless tobacco: Never Used  . Alcohol Use: No    Allergies: No Known Allergies  Prescriptions prior to admission  Medication Sig Dispense Refill  . insulin regular (HUMULIN R) 100 units/mL injection Inject 0.25 mLs (25 Units total) into the skin 3 (three) times daily before meals. 25 units before breakfast, 20 units before  lunch and 20 units before dinner  30 mL  12  -  NPH 40u bid - Methyldopa 500mg  bid - Albuterol 2 puffs q6/prn  Review of Systems - Negative except per HPI  Physical Exam   Blood pressure 145/79, pulse 86, temperature 97.3 F (36.3 C), temperature source Oral, resp. rate 18, height 5\' 4"  (1.626 m), weight 336 lb (152.409 kg).  General: General appearance - alert, well appearing, and in no distress and obese CV: S1S2, RRR, no murmurs, rubs or gallops Chest: CTA B/L, no wheezes appreciated Abdomen: gravid Extremities: no cyanosis, clubbing or edema Baseline: 135 bpm, Variability: Good {> 6 bpm), Accelerations: Reactive and Decelerations: Absent none   Labs: No results found for this or any previous visit (from the past 24 hour(s)). Imaging Studies:  US Ob Follow Up  US 08/03/2011: BPP 8/8   08/03/2011  OBSTETRICAL ULTRASOUND: This exam was performed within a [place] Ultrasound Department. The OB US report was generated in the AS system, and faxed to the ordering physician.   This report is also available in [employer] and in the [employer]. See AS Obstetric US report.   US Ob Follow Up  07/13/2011  OBSTETRICAL ULTRASOUND: This exam was performed within a [place] Ultrasound Department. The OB US report was generated in the AS system, and faxed to the ordering physician.   This report is also available in [employer] and in the [employer]. See  AS Obstetric US report.   US Ob Transvaginal  07/13/2011  OBSTETRICAL ULTRASOUND: This exam was performed within a [place] Ultrasound Department. The OB US report was generated in the AS system, and faxed to the ordering physician.   This report is also available in [employer] and in the [employer]. See AS Obstetric US report.   US Fetal Bpp W/o Non Stress  08/03/2011  OBSTETRICAL ULTRASOUND: This exam was performed within a [place] Ultrasound Department. The  OB US report was generated in the AS system, and faxed to the ordering physician.   This report is also available in [employer] and in the [employer]. See AS Obstetric US report.     Assessment: Anna Hawkins is  35 y.o. [id number] at [redacted]w[redacted]d presents with non reactive stress test in clinic. BPP 8/8 and reactive NST in MAU.   Plan: 1. Fetal well being: reassuring. Follow up on Thursday for NST. Pt has appointment scheduled 2. Chronic Hypertension: continue on methyldopa and follow up in clinic on 24 hr urine 3. DM2: continue insulin regimen  Dr. Debroah Loop was informed of patient and agrees with plan of care  [name], [name]8/13/201211:30 AM

## 2011-08-03 NOTE — Progress Notes (Signed)
Reactive NST 

## 2011-08-03 NOTE — Progress Notes (Signed)
Nonreactive, decels noted. Pt sent to MAU for prolonged testing and BPP.

## 2011-08-03 NOTE — Progress Notes (Signed)
NST reactive.

## 2011-08-03 NOTE — ED Notes (Signed)
No adverse effects from Insulin.

## 2011-08-03 NOTE — Progress Notes (Signed)
Addended by: Adam Phenix on: 08/03/2011 03:20 PM   Modules accepted: Level of Service

## 2011-08-03 NOTE — Progress Notes (Signed)
Was seen for clinic appt. this AM. Was having FHR decels. on EFM.  Was sent to MAU for further evaluation and BPP. Pt. Was brought from lobby to room 7.

## 2011-08-04 LAB — CREATININE CLEARANCE, URINE, 24 HOUR
Creatinine Clearance: 219 mL/min — ABNORMAL HIGH (ref 75–115)
Creatinine, 24H Ur: 1737 mg/d (ref 700–1800)
Creatinine, Urine: 204.3 mg/dL

## 2011-08-06 ENCOUNTER — Inpatient Hospital Stay (HOSPITAL_COMMUNITY)
Admission: AD | Admit: 2011-08-06 | Discharge: 2011-08-06 | Disposition: A | Discharge status: Home / Self Care | Payer: Medicaid Other | Source: Ambulatory Visit | Attending: Obstetrics and Gynecology | Admitting: Obstetrics and Gynecology

## 2011-08-06 ENCOUNTER — Encounter (HOSPITAL_COMMUNITY): Payer: Self-pay | Admitting: *Deleted

## 2011-08-06 ENCOUNTER — Ambulatory Visit (INDEPENDENT_AMBULATORY_CARE_PROVIDER_SITE_OTHER): Payer: Medicaid Other | Admitting: Obstetrics and Gynecology

## 2011-08-06 DIAGNOSIS — O24919 Unspecified diabetes mellitus in pregnancy, unspecified trimester: Secondary | ICD-10-CM

## 2011-08-06 DIAGNOSIS — O139 Gestational [pregnancy-induced] hypertension without significant proteinuria, unspecified trimester: Secondary | ICD-10-CM | POA: Insufficient documentation

## 2011-08-06 DIAGNOSIS — O09529 Supervision of elderly multigravida, unspecified trimester: Secondary | ICD-10-CM

## 2011-08-06 DIAGNOSIS — R109 Unspecified abdominal pain: Secondary | ICD-10-CM | POA: Insufficient documentation

## 2011-08-06 DIAGNOSIS — A5619 Other chlamydial genitourinary infection: Secondary | ICD-10-CM | POA: Insufficient documentation

## 2011-08-06 DIAGNOSIS — O99810 Abnormal glucose complicating pregnancy: Secondary | ICD-10-CM | POA: Insufficient documentation

## 2011-08-06 DIAGNOSIS — O98319 Other infections with a predominantly sexual mode of transmission complicating pregnancy, unspecified trimester: Secondary | ICD-10-CM | POA: Insufficient documentation

## 2011-08-06 DIAGNOSIS — N739 Female pelvic inflammatory disease, unspecified: Secondary | ICD-10-CM | POA: Insufficient documentation

## 2011-08-06 DIAGNOSIS — O169 Unspecified maternal hypertension, unspecified trimester: Secondary | ICD-10-CM

## 2011-08-06 MED ORDER — HYDROXYZINE PAMOATE 50 MG PO CAPS
50.0000 mg | ORAL_CAPSULE | Freq: Once | ORAL | Status: AC
  Administered 2011-08-06: 50 mg via ORAL
  Filled 2011-08-06: qty 1

## 2011-08-06 MED ORDER — HYDROXYZINE PAMOATE 50 MG PO CAPS: 50.0000 mg | ORAL_CAPSULE | Freq: Three times a day (TID) | ORAL | Status: DC | PRN

## 2011-08-06 NOTE — ED Provider Notes (Signed)
Chief Complaint:  Abdominal Pain   Anna Hawkins is  35 y.o. [id number]. St 35+4wks who presents with several hours of abdominal cramping.  They are low across her belly and feel like menstrual cramps.  They have occurred several times an hour.  No VB, no LOF. +FM.  Her pregnancy has been complicated by 1)HTN on methyldopa, 2)DM on insulin.  She followed in [facility] clinic Obstetrical/Gynecological History: OB History    Grav Para Term Preterm Abortions TAB SAB Ect Mult Living   7 2 2  0 4 2 2  0 0 2      Past Medical History: Past Medical History  Diagnosis Date  . Asthma   . Chlamydia   . Obesity   . Gestational diabetes   . Pregnancy induced hypertension     Past Surgical History: Past Surgical History  Procedure Date  . Therapeutic abortion   . Wisdom tooth extraction     Family History: Family History  Problem Relation Age of Onset  . Diabetes Mother   . Hypertension Mother   . Diabetes Brother   . Hypertension Brother     Social History: History  Substance Use Topics  . Smoking status: Never Smoker   . Smokeless tobacco: Never Used  . Alcohol Use: No    Allergies: No Known Allergies  Prescriptions prior to admission  Medication Sig Dispense Refill  . albuterol (PROVENTIL,VENTOLIN) 90 MCG/ACT inhaler Inhale 2 puffs into the lungs every 6 (six) hours as needed. For shortness of breath       . insulin NPH (HUMULIN N,NOVOLIN N) 100 UNIT/ML injection Inject 40 Units into the skin 2 (two) times daily. Patient takes 40 units in the morning and 40 units at night       . insulin regular (HUMULIN R) 100 units/mL injection Inject 0.25 mLs (25 Units total) into the skin 3 (three) times daily before meals. 25 units before breakfast, 20 units before lunch and 20 units before dinner  30 mL  12  . methyldopa (ALDOMET) 500 MG tablet Take 500 mg by mouth 2 (two) times daily.          Review of Systems - Negative except list in HPI, no headaches, blurred vision, RUQ pain,  worsening edema  Physical Exam   Blood pressure 147/84, pulse 94, temperature 98.6 F (37 C), temperature source Oral, resp. rate 20, height 5\' 4"  (1.626 m), weight 342 lb (155.13 kg).  General: General appearance - alert, well appearing, and in no distress and overweight Chest - clear to auscultation, no wheezes, rales or rhonchi, symmetric air entry Heart - normal rate, regular rhythm, normal S1, S2, no murmurs, rubs, clicks or gallops Abdomen - soft, obese, gravid Pelvic - SVE closed/thick/high Extremities - peripheral pulses normal, no pedal edema, no clubbing or cyanosis  Labs: No results found for this or any previous visit (from the past 24 hour(s)). Imaging Studies:  US Ob Follow Up  08/03/2011  OBSTETRICAL ULTRASOUND: This exam was performed within a [place] Ultrasound Department. The OB US report was generated in the AS system, and faxed to the ordering physician.   This report is also available in [employer] and in the [employer]. See AS Obstetric US report.   US Ob Follow Up  07/13/2011  OBSTETRICAL ULTRASOUND: This exam was performed within a [place] Ultrasound Department. The OB US report was generated in the AS system, and faxed to the ordering physician.   This report is  also available in [employer] and in the [employer]. See AS Obstetric US report.   US Ob Transvaginal  07/13/2011  OBSTETRICAL ULTRASOUND: This exam was performed within a [place] Ultrasound Department. The OB US report was generated in the AS system, and faxed to the ordering physician.   This report is also available in [employer] and in the [employer]. See AS Obstetric US report.   US Fetal Bpp W/o Non Stress  08/03/2011  OBSTETRICAL ULTRASOUND: This exam was performed within a [place] Ultrasound Department. The OB US report was generated in the AS system, and faxed to the ordering physician.   This report is also  available in [employer] and in the [employer]. See AS Obstetric US report.     Assessment: Patient Active Problem List  Diagnoses  . Diabetes mellitus, antepartum  . Unspecified hypertension antepartum  . Elderly multigravida with antepartum condition or complication  . Chlamydia    Plan:  Patient's cervical exam was closed/thick/high and she did not have any contractions on toco.  She was monitored for over 2 hours on continuous monitoring due to initial repetitive variables.  With position change, the FHT was reassuring for >1hr.  Initial BP was elevated with SBP 197 but following serial BPs with appropriate BP cuff were 130s-150s/70s-90s.  Patient also notes she did not take her methyldopa this evening.  Patient was then discharged home. Lindaann Slough MD  I discussed this case with Cathie Beams who is in agreement

## 2011-08-06 NOTE — Progress Notes (Signed)
Dr. Young at bedside.  Assessment done and poc discussed with pt.  

## 2011-08-06 NOTE — Progress Notes (Signed)
Dr. Maple Hudson notified of pt arrival for c/o back pain and cramping.  Will be up to see pt.

## 2011-08-06 NOTE — Progress Notes (Signed)
[redacted] wks pregnant with abd pain (cramping) started about 1900, no bleeding. G7p2

## 2011-08-06 NOTE — Progress Notes (Signed)
Pt presents to [facility] for c/o cramping and back pain that started around 7pm.  States she was hurting so she went walking and thought that would make it go away.

## 2011-08-10 ENCOUNTER — Ambulatory Visit (INDEPENDENT_AMBULATORY_CARE_PROVIDER_SITE_OTHER): Payer: Medicaid Other | Admitting: Obstetrics & Gynecology

## 2011-08-10 DIAGNOSIS — O09529 Supervision of elderly multigravida, unspecified trimester: Secondary | ICD-10-CM

## 2011-08-10 DIAGNOSIS — O169 Unspecified maternal hypertension, unspecified trimester: Secondary | ICD-10-CM

## 2011-08-10 DIAGNOSIS — O24919 Unspecified diabetes mellitus in pregnancy, unspecified trimester: Secondary | ICD-10-CM

## 2011-08-10 MED ORDER — INSULIN NPH (HUMAN) (ISOPHANE) 100 UNIT/ML ~~LOC~~ SUSP: 40.0000 [IU] | Freq: Two times a day (BID) | SUBCUTANEOUS | Status: DC

## 2011-08-10 NOTE — Progress Notes (Signed)
NST 8/20 reviewed, no decels, reactive

## 2011-08-10 NOTE — Progress Notes (Signed)
Addended by: Adam Phenix on: 08/10/2011 11:25 AM   Modules accepted: Orders

## 2011-08-12 NOTE — Progress Notes (Signed)
8/16 NST reviewed

## 2011-08-13 ENCOUNTER — Ambulatory Visit (HOSPITAL_COMMUNITY)
Admission: RE | Admit: 2011-08-13 | Discharge: 2011-08-13 | Disposition: A | Discharge status: Home / Self Care | Payer: Medicaid Other | Source: Ambulatory Visit | Attending: Obstetrics & Gynecology | Admitting: Obstetrics & Gynecology

## 2011-08-13 ENCOUNTER — Ambulatory Visit (INDEPENDENT_AMBULATORY_CARE_PROVIDER_SITE_OTHER): Payer: Medicaid Other | Admitting: *Deleted

## 2011-08-13 DIAGNOSIS — O169 Unspecified maternal hypertension, unspecified trimester: Secondary | ICD-10-CM

## 2011-08-13 DIAGNOSIS — O09529 Supervision of elderly multigravida, unspecified trimester: Secondary | ICD-10-CM

## 2011-08-13 DIAGNOSIS — E669 Obesity, unspecified: Secondary | ICD-10-CM | POA: Insufficient documentation

## 2011-08-13 DIAGNOSIS — O10019 Pre-existing essential hypertension complicating pregnancy, unspecified trimester: Secondary | ICD-10-CM | POA: Insufficient documentation

## 2011-08-13 DIAGNOSIS — O99810 Abnormal glucose complicating pregnancy: Secondary | ICD-10-CM

## 2011-08-13 DIAGNOSIS — O09299 Supervision of pregnancy with other poor reproductive or obstetric history, unspecified trimester: Secondary | ICD-10-CM | POA: Insufficient documentation

## 2011-08-13 DIAGNOSIS — O24919 Unspecified diabetes mellitus in pregnancy, unspecified trimester: Secondary | ICD-10-CM | POA: Insufficient documentation

## 2011-08-17 ENCOUNTER — Other Ambulatory Visit: Payer: Self-pay

## 2011-08-17 ENCOUNTER — Other Ambulatory Visit: Payer: Self-pay | Admitting: Obstetrics and Gynecology

## 2011-08-17 ENCOUNTER — Ambulatory Visit (INDEPENDENT_AMBULATORY_CARE_PROVIDER_SITE_OTHER): Payer: Medicaid Other | Admitting: Obstetrics and Gynecology

## 2011-08-17 ENCOUNTER — Observation Stay (HOSPITAL_COMMUNITY)
Admission: AD | Admit: 2011-08-17 | Discharge: 2011-08-19 | Disposition: A | Discharge status: Home / Self Care | Payer: Medicaid Other | Source: Ambulatory Visit | Attending: Obstetrics and Gynecology | Admitting: Obstetrics and Gynecology

## 2011-08-17 DIAGNOSIS — N739 Female pelvic inflammatory disease, unspecified: Secondary | ICD-10-CM | POA: Insufficient documentation

## 2011-08-17 DIAGNOSIS — O09529 Supervision of elderly multigravida, unspecified trimester: Secondary | ICD-10-CM | POA: Insufficient documentation

## 2011-08-17 DIAGNOSIS — O99810 Abnormal glucose complicating pregnancy: Secondary | ICD-10-CM | POA: Insufficient documentation

## 2011-08-17 DIAGNOSIS — O169 Unspecified maternal hypertension, unspecified trimester: Secondary | ICD-10-CM

## 2011-08-17 DIAGNOSIS — O98319 Other infections with a predominantly sexual mode of transmission complicating pregnancy, unspecified trimester: Secondary | ICD-10-CM | POA: Insufficient documentation

## 2011-08-17 DIAGNOSIS — O24919 Unspecified diabetes mellitus in pregnancy, unspecified trimester: Secondary | ICD-10-CM

## 2011-08-17 DIAGNOSIS — O10019 Pre-existing essential hypertension complicating pregnancy, unspecified trimester: Principal | ICD-10-CM | POA: Insufficient documentation

## 2011-08-17 DIAGNOSIS — A5619 Other chlamydial genitourinary infection: Secondary | ICD-10-CM | POA: Insufficient documentation

## 2011-08-17 LAB — POCT URINALYSIS DIP (DEVICE)
Leukocytes, UA: NEGATIVE
Nitrite: NEGATIVE
Protein, ur: 30 mg/dL — AB
Urobilinogen, UA: 1 mg/dL (ref 0.0–1.0)
pH: 6 (ref 5.0–8.0)

## 2011-08-17 NOTE — Progress Notes (Signed)
I have reviewed NST of 8/27

## 2011-08-18 ENCOUNTER — Encounter (HOSPITAL_COMMUNITY): Payer: Self-pay | Admitting: *Deleted

## 2011-08-18 ENCOUNTER — Encounter: Payer: Self-pay | Admitting: Obstetrics and Gynecology

## 2011-08-18 DIAGNOSIS — IMO0002 Reserved for concepts with insufficient information to code with codable children: Secondary | ICD-10-CM

## 2011-08-18 LAB — DIFFERENTIAL
Basophils Relative: 0 % (ref 0–1)
Eosinophils Absolute: 0.6 10*3/uL (ref 0.0–0.7)
Monocytes Absolute: 1.1 10*3/uL — ABNORMAL HIGH (ref 0.1–1.0)
Neutro Abs: 11 10*3/uL — ABNORMAL HIGH (ref 1.7–7.7)
Neutrophils Relative %: 68 % (ref 43–77)

## 2011-08-18 LAB — CBC
HCT: 30.4 % — ABNORMAL LOW (ref 36.0–46.0)
Hemoglobin: 9.7 g/dL — ABNORMAL LOW (ref 12.0–15.0)
MCH: 23.3 pg — ABNORMAL LOW (ref 26.0–34.0)
MCV: 72.9 fL — ABNORMAL LOW (ref 78.0–100.0)
RBC: 4.17 MIL/uL (ref 3.87–5.11)
WBC: 16.1 10*3/uL — ABNORMAL HIGH (ref 4.0–10.5)

## 2011-08-18 LAB — URINALYSIS, ROUTINE W REFLEX MICROSCOPIC
Bilirubin Urine: NEGATIVE
Hgb urine dipstick: NEGATIVE
Protein, ur: NEGATIVE mg/dL
Urobilinogen, UA: 1 mg/dL (ref 0.0–1.0)

## 2011-08-18 LAB — GLUCOSE, CAPILLARY
Glucose-Capillary: 47 mg/dL — ABNORMAL LOW (ref 70–99)
Glucose-Capillary: 131 mg/dL — ABNORMAL HIGH (ref 70–99)

## 2011-08-18 LAB — URINE MICROSCOPIC-ADD ON

## 2011-08-18 LAB — COMPREHENSIVE METABOLIC PANEL
AST: 13 U/L (ref 0–37)
Albumin: 2.4 g/dL — ABNORMAL LOW (ref 3.5–5.2)
Calcium: 9.3 mg/dL (ref 8.4–10.5)
Chloride: 102 mEq/L (ref 96–112)
Creatinine, Ser: 0.71 mg/dL (ref 0.50–1.10)
Total Protein: 6.1 g/dL (ref 6.0–8.3)

## 2011-08-18 LAB — GC/CHLAMYDIA PROBE AMP, GENITAL: Chlamydia, DNA Probe: NEGATIVE

## 2011-08-18 MED ORDER — INSULIN REGULAR HUMAN 100 UNIT/ML IJ SOLN
25.0000 [IU] | Freq: Three times a day (TID) | INTRAMUSCULAR | Status: DC
  Administered 2011-08-18 – 2011-08-19 (×4): 25 [IU] via SUBCUTANEOUS
  Filled 2011-08-18: qty 0.25

## 2011-08-18 MED ORDER — METHYLDOPA 500 MG PO TABS
500.0000 mg | ORAL_TABLET | Freq: Two times a day (BID) | ORAL | Status: DC
  Administered 2011-08-18 (×2): 500 mg via ORAL
  Filled 2011-08-18 (×4): qty 1

## 2011-08-18 MED ORDER — METHYLDOPA 500 MG PO TABS
500.0000 mg | ORAL_TABLET | Freq: Three times a day (TID) | ORAL | Status: DC
  Administered 2011-08-18 – 2011-08-19 (×3): 500 mg via ORAL
  Filled 2011-08-18 (×5): qty 1

## 2011-08-18 MED ORDER — INSULIN NPH (HUMAN) (ISOPHANE) 100 UNIT/ML ~~LOC~~ SUSP
44.0000 [IU] | Freq: Every day | SUBCUTANEOUS | Status: DC
  Administered 2011-08-18: 44 [IU] via SUBCUTANEOUS
  Filled 2011-08-18: qty 10

## 2011-08-18 MED ORDER — SODIUM CHLORIDE 0.9 % IJ SOLN
3.0000 mL | Freq: Two times a day (BID) | INTRAMUSCULAR | Status: DC
  Administered 2011-08-18 – 2011-08-19 (×4): 3 mL via INTRAVENOUS
  Filled 2011-08-18: qty 3

## 2011-08-18 MED ORDER — INSULIN NPH (HUMAN) (ISOPHANE) 100 UNIT/ML ~~LOC~~ SUSP
42.0000 [IU] | Freq: Every day | SUBCUTANEOUS | Status: DC
  Administered 2011-08-19: 42 [IU] via SUBCUTANEOUS
  Filled 2011-08-18: qty 10

## 2011-08-18 MED ORDER — CALCIUM CARBONATE ANTACID 500 MG PO CHEW
2.0000 | CHEWABLE_TABLET | ORAL | Status: DC | PRN
  Administered 2011-08-18: 400 mg via ORAL
  Filled 2011-08-18 (×2): qty 2

## 2011-08-18 MED ORDER — ALBUTEROL 90 MCG/ACT IN AERS
2.0000 | INHALATION_SPRAY | Freq: Four times a day (QID) | RESPIRATORY_TRACT | Status: DC | PRN
  Filled 2011-08-18: qty 2

## 2011-08-18 MED ORDER — DOCUSATE SODIUM 100 MG PO CAPS
100.0000 mg | ORAL_CAPSULE | Freq: Every day | ORAL | Status: DC
  Administered 2011-08-18 – 2011-08-19 (×2): 100 mg via ORAL
  Filled 2011-08-18 (×4): qty 1

## 2011-08-18 MED ORDER — PRENATAL PLUS 27-1 MG PO TABS
1.0000 | ORAL_TABLET | Freq: Every day | ORAL | Status: DC
  Administered 2011-08-18 – 2011-08-19 (×2): 1 via ORAL
  Filled 2011-08-18 (×4): qty 1

## 2011-08-18 MED ORDER — INSULIN NPH (HUMAN) (ISOPHANE) 100 UNIT/ML ~~LOC~~ SUSP
40.0000 [IU] | Freq: Every day | SUBCUTANEOUS | Status: DC
  Administered 2011-08-18: 40 [IU] via SUBCUTANEOUS
  Filled 2011-08-18: qty 10

## 2011-08-18 MED ORDER — INSULIN NPH (HUMAN) (ISOPHANE) 100 UNIT/ML ~~LOC~~ SUSP
42.0000 [IU] | Freq: Every day | SUBCUTANEOUS | Status: DC
  Filled 2011-08-18: qty 10

## 2011-08-18 NOTE — H&P (Signed)
Chief Complaint:  No chief complaint on file.   Anna Hawkins is  35 y.o. [id number] at [redacted]w[redacted]d presents complaining of left sided low back pain.  The pain started around 6pm yesterday; it was initially central, but became more left sided.  Pt states no usual activity, did have appt at [facility] where they obtained a cervical swab.  The pain is crampy in nature and occasionally wraps around her pelvis. States pain has been steadily getting worse.  Has not tried any medications to alleviate the pain.  Had some nausea, vomiting, diarrhea a few days ago; no fever.  States feet are quite swollen.  Did have "spots" in right eye earlier this evening but have resolved.  Denies headache, vision changes, abdominal pain, urinary symptoms.    She states no contractions associated with no loss of fluid or vaginal bleeding.  Reports decreased fetal movement since pain started at 6pm.  Cervix check at clinic yesterday was closed and thick.  Obstetrical/Gynecological History: OB History    Grav Para Term Preterm Abortions TAB SAB Ect Mult Living   7 2 2  0 4 2 2  0 0 2      Past Medical History: Past Medical History  Diagnosis Date  . Asthma   . Chlamydia   . Obesity   . Gestational diabetes   . Pregnancy induced hypertension     Past Surgical History: Past Surgical History  Procedure Date  . Therapeutic abortion   . Wisdom tooth extraction     Family History: Family History  Problem Relation Age of Onset  . Diabetes Mother   . Hypertension Mother   . Diabetes Brother   . Hypertension Brother     Social History: History  Substance Use Topics  . Smoking status: Never Smoker   . Smokeless tobacco: Never Used  . Alcohol Use: No    Allergies: No Known Allergies  Prescriptions prior to admission  Medication Sig Dispense Refill  . albuterol (PROVENTIL,VENTOLIN) 90 MCG/ACT inhaler Inhale 2 puffs into the lungs every 6 (six) hours as needed. For shortness of breath       . hydrOXYzine  (VISTARIL) 50 MG capsule Take 50 mg by mouth 3 (three) times daily as needed. For cramping      . hydrOXYzine (VISTARIL) 50 MG capsule Take 50 mg by mouth 3 (three) times daily as needed. For cramping       . insulin NPH (HUMULIN N,NOVOLIN N) 100 UNIT/ML injection Inject 40 Units into the skin 2 (two) times daily. Patient takes 40 units in the morning and 40 units at night       . insulin regular (HUMULIN R,NOVOLIN R) 100 units/mL injection Inject 25 Units into the skin 3 (three) times daily before meals. 25 units before breakfast, 20 units before lunch and 20 units before dinner       . methyldopa (ALDOMET) 500 MG tablet Take 500 mg by mouth 2 (two) times daily.        Marland Kitchen DISCONTD: insulin NPH (HUMULIN N,NOVOLIN N) 100 UNIT/ML injection Inject 40 Units into the skin 2 (two) times daily. Patient takes 40 units in the morning and 40 units at night  10 mL  2  . DISCONTD: insulin regular (HUMULIN R) 100 units/mL injection Inject 0.25 mLs (25 Units total) into the skin 3 (three) times daily before meals. 25 units before breakfast, 20 units before lunch and 20 units before dinner  30 mL  12  . DISCONTD: hydrOXYzine (VISTARIL) 50 MG  capsule Take 1 capsule (50 mg total) by mouth 3 (three) times daily as needed (cramping).  30 capsule  0    Review of Systems - Negative except as in HPI  Physical Exam   Blood pressure 157/101, pulse 98, temperature 98.8 F (37.1 C), temperature source Oral, resp. rate 18, height 5\' 2"  (1.575 m), weight 349 lb 6 oz (158.475 kg).  General: General appearance - alert, well appearing, and in no distress and obese Chest - clear to auscultation, no wheezes, rales or rhonchi, symmetric air entry Heart - normal rate, regular rhythm, normal S1, S2, no murmurs, rubs, clicks or gallops Abdomen - soft, +BS, gravid Back exam - tender to palpation over lumbar spine and left lower back; no tenderness on right side Extremities - pedal edema 2 + to mid-shin, intact peripheral  pulses Skin - normal coloration and turgor, no rashes, no suspicious skin lesions noted Baseline: 140 bpm, Variability: Fair (1-6 bpm), Accelerations: Reactive and Decelerations: Absent Contractions: none   Labs: Recent Results (from the past 24 hour(s))  POCT URINALYSIS DIP (DEVICE)   Collection Time   08/17/11  9:29 AM      Component Value Range   Glucose, UA 250 (*) NEGATIVE (mg/dL)   Bilirubin Urine SMALL (*) NEGATIVE    Ketones, ur 15 (*) NEGATIVE (mg/dL)   Specific Gravity, Urine >=1.030  1.005 - 1.030    Hgb urine dipstick NEGATIVE  NEGATIVE    pH 6.0  5.0 - 8.0    Protein, ur 30 (*) NEGATIVE (mg/dL)   Urobilinogen, UA 1.0  0.0 - 1.0 (mg/dL)   Nitrite NEGATIVE  NEGATIVE    Leukocytes, UA NEGATIVE  NEGATIVE   CBC   Collection Time   08/18/11  1:00 AM      Component Value Range   WBC 16.1 (*) 4.0 - 10.5 (K/uL)   RBC 4.17  3.87 - 5.11 (MIL/uL)   Hemoglobin 9.7 (*) 12.0 - 15.0 (g/dL)   HCT 16.1 (*) 09.6 - 46.0 (%)   MCV 72.9 (*) 78.0 - 100.0 (fL)   MCH 23.3 (*) 26.0 - 34.0 (pg)   MCHC 31.9  30.0 - 36.0 (g/dL)   RDW 04.5  40.9 - 81.1 (%)   Platelets 301  150 - 400 (K/uL)  DIFFERENTIAL   Collection Time   08/18/11  1:00 AM      Component Value Range   Neutrophils Relative PENDING  43 - 77 (%)   Neutro Abs PENDING  1.7 - 7.7 (K/uL)   Band Neutrophils PENDING  0 - 10 (%)   Lymphocytes Relative PENDING  12 - 46 (%)   Lymphs Abs PENDING  0.7 - 4.0 (K/uL)   Monocytes Relative PENDING  3 - 12 (%)   Monocytes Absolute PENDING  0.1 - 1.0 (K/uL)   Eosinophils Relative PENDING  0 - 5 (%)   Eosinophils Absolute PENDING  0.0 - 0.7 (K/uL)   Basophils Relative PENDING  0 - 1 (%)   Basophils Absolute PENDING  0.0 - 0.1 (K/uL)   WBC Morphology PENDING     RBC Morphology PENDING     Smear Review PENDING     nRBC PENDING  0 (/100 WBC)   Metamyelocytes Relative PENDING     Myelocytes PENDING     Promyelocytes Absolute PENDING     Blasts PENDING     Imaging Studies:  US Ob  Follow Up  08/13/2011  OBSTETRICAL ULTRASOUND: This exam was performed within a [facility].  The OB US report was generated in the AS system, and faxed to the ordering physician.   This report is also available in [employer] and in the [employer]. See AS Obstetric US report.   US Ob Follow Up  08/03/2011  OBSTETRICAL ULTRASOUND: This exam was performed within a [facility]. The OB US report was generated in the AS system, and faxed to the ordering physician.   This report is also available in [employer] and in the [employer]. See AS Obstetric US report.   US Fetal Bpp W/o Non Stress  08/03/2011  OBSTETRICAL ULTRASOUND: This exam was performed within a [facility]. The OB US report was generated in the AS system, and faxed to the ordering physician.   This report is also available in [employer] and in the [employer]. See AS Obstetric US report.     Assessment: BEXLEIGH THERIAULT is  35 y.o. [phone] at [redacted]w[redacted]d presents with left-sided low back pain and elevated blood pressures.  Elevated BP concerning for development of preeclampsia on top of chronic hypertension.  Left sided low back pain etiology unclear; possibilities include pregnancy, nephrolithiasis, pyelonephritis, muscle strain.  Renal etiologies are unlikely with a relatively normal UA yesterday morning. -chronic hypertension -insulin dependent diabetes -AMA -GBS unknown  Plan: -admit to antenatal for observation given her elevated BP -CBC, CMP, UA, urine pr/cr ratio, 24-hr urine protein -continue methyldopa 500mg  BID -continue home insulin regimen for diabetes control -tylenol for back pain -heating pad for back pain   BOOTH, ERIN8/28/20121:34 AM

## 2011-08-18 NOTE — Progress Notes (Signed)
FACULTY PRACTICE ANTEPARTUM(COMPREHENSIVE) NOTE  Anna Hawkins is a 35 y.o. [id number] at [redacted]w[redacted]d  who is admitted for [facility] r/o pre-eclampsia.   Fetal presentation is cephalic. Length of Stay:  1  Days  Subjective: Patient reports feeling well. Reports resolution of back pain. Denies HA, visual disturbances, RUQ/epigastric pain Patient reports the fetal movement as active. Patient reports uterine contraction  activity as none. Patient reports  vaginal bleeding as none. Patient describes fluid per vagina as None.  Vitals:  Blood pressure 159/83, pulse 114, temperature 98.4 F (36.9 C), temperature source Oral, resp. rate 22, height 5\' 2"  (1.575 m), weight 158.475 kg (349 lb 6 oz). Physical Examination:  General appearance - alert, well appearing, and in no distress and overweight Fundal Height:  obese Pelvic Exam:  normal external genitalia, vulva, vagina, cervix, uterus and adnexa, examination not indicated Cervical Exam: Not evaluated. Extremities: extremities normal, atraumatic, no cyanosis or edema and edema bilaterally to mid-shin with DTRs 2+ on the bilateral Membranes:intact    Labs:  Recent Results (from the past 24 hour(s))  POCT URINALYSIS DIP (DEVICE)   Collection Time   08/17/11  9:29 AM      Component Value Range   Glucose, UA 250 (*) NEGATIVE (mg/dL)   Bilirubin Urine SMALL (*) NEGATIVE    Ketones, ur 15 (*) NEGATIVE (mg/dL)   Specific Gravity, Urine >=1.030  1.005 - 1.030    Hgb urine dipstick NEGATIVE  NEGATIVE    pH 6.0  5.0 - 8.0    Protein, ur 30 (*) NEGATIVE (mg/dL)   Urobilinogen, UA 1.0  0.0 - 1.0 (mg/dL)   Nitrite NEGATIVE  NEGATIVE    Leukocytes, UA NEGATIVE  NEGATIVE   COMPREHENSIVE METABOLIC PANEL   Collection Time   08/18/11  1:00 AM      Component Value Range   Sodium 134 (*) 135 - 145 (mEq/L)   Potassium 3.9  3.5 - 5.1 (mEq/L)   Chloride 102  96 - 112 (mEq/L)   CO2 23  19 - 32 (mEq/L)   Glucose, Bld 226 (*) 70 - 99 (mg/dL)   BUN 10  6 - 23  (mg/dL)   Creatinine, Ser 4.54  0.50 - 1.10 (mg/dL)   Calcium 9.3  8.4 - 09.8 (mg/dL)   Total Protein 6.1  6.0 - 8.3 (g/dL)   Albumin 2.4 (*) 3.5 - 5.2 (g/dL)   AST 13  0 - 37 (U/L)   ALT 9  0 - 35 (U/L)   Alkaline Phosphatase 204 (*) 39 - 117 (U/L)   Total Bilirubin 0.3  0.3 - 1.2 (mg/dL)   GFR calc non Af Amer >60  >60 (mL/min)   GFR calc Af Amer >60  >60 (mL/min)  CBC   Collection Time   08/18/11  1:00 AM      Component Value Range   WBC 16.1 (*) 4.0 - 10.5 (K/uL)   RBC 4.17  3.87 - 5.11 (MIL/uL)   Hemoglobin 9.7 (*) 12.0 - 15.0 (g/dL)   HCT 11.9 (*) 14.7 - 46.0 (%)   MCV 72.9 (*) 78.0 - 100.0 (fL)   MCH 23.3 (*) 26.0 - 34.0 (pg)   MCHC 31.9  30.0 - 36.0 (g/dL)   RDW 82.9  56.2 - 13.0 (%)   Platelets 301  150 - 400 (K/uL)  DIFFERENTIAL   Collection Time   08/18/11  1:00 AM      Component Value Range   Neutrophils Relative 68  43 - 77 (%)  Lymphocytes Relative 21  12 - 46 (%)   Monocytes Relative 7  3 - 12 (%)   Eosinophils Relative 4  0 - 5 (%)   Basophils Relative 0  0 - 1 (%)   Neutro Abs 11.0 (*) 1.7 - 7.7 (K/uL)   Lymphs Abs 3.4  0.7 - 4.0 (K/uL)   Monocytes Absolute 1.1 (*) 0.1 - 1.0 (K/uL)   Eosinophils Absolute 0.6  0.0 - 0.7 (K/uL)   Basophils Absolute 0.0  0.0 - 0.1 (K/uL)  URINALYSIS, ROUTINE W REFLEX MICROSCOPIC   Collection Time   08/18/11  1:55 AM      Component Value Range   Color, Urine YELLOW  YELLOW    Appearance CLEAR  CLEAR    Specific Gravity, Urine 1.025  1.005 - 1.030    pH 6.0  5.0 - 8.0    Glucose, UA >1000 (*) NEGATIVE (mg/dL)   Hgb urine dipstick NEGATIVE  NEGATIVE    Bilirubin Urine NEGATIVE  NEGATIVE    Ketones, ur 15 (*) NEGATIVE (mg/dL)   Protein, ur NEGATIVE  NEGATIVE (mg/dL)   Urobilinogen, UA 1.0  0.0 - 1.0 (mg/dL)   Nitrite NEGATIVE  NEGATIVE    Leukocytes, UA NEGATIVE  NEGATIVE   URINE MICROSCOPIC-ADD ON   Collection Time   08/18/11  1:55 AM      Component Value Range   Squamous Epithelial / LPF MANY (*) RARE    WBC, UA 0-2   <3 (WBC/hpf)   RBC / HPF 0-2  <3 (RBC/hpf)   Bacteria, UA FEW (*) RARE     Imaging Studies:    Currently EPIC will not allow sonographic studies to automatically populate into notes.  In the meantime, copy and paste results into note or free text.  Medications:  Scheduled    . docusate sodium  100 mg Oral Daily  . insulin NPH  40 Units Subcutaneous QAC breakfast  . insulin NPH  42 Units Subcutaneous QHS  . insulin regular  25 Units Subcutaneous TID AC  . methyldopa  500 mg Oral BID  . prenatal vitamin w/FE, FA  1 tablet Oral Daily  . sodium chloride  3 mL Intravenous Q12H   I have reviewed the patient's current medications.  ASSESSMENT: Patient Active Problem List  Diagnoses  . Diabetes mellitus, antepartum  . Unspecified hypertension antepartum  . Elderly multigravida with antepartum condition or complication  . Chlamydia    PLAN: 35 yo [id number] ay [redacted]w[redacted]d with GDM A2/B and CHTN admitted to r/o preeclampsia - Cont collecting 24hr urine - Monitor BP - F/U NST - Cont current care  Anna Hawkins 08/18/2011,7:33 AM

## 2011-08-18 NOTE — Progress Notes (Signed)
UR completed 

## 2011-08-18 NOTE — Progress Notes (Signed)
PT SAYS SHE STARTED HURTING AT 615PM-  VE  TODAY-  CLOSED.    HRC- DIABETES AND HBP.Marland Kitchen DENIES HSV AND MRSA.

## 2011-08-18 NOTE — Progress Notes (Signed)
Pt c/o cramping in low abd  That worsened this evening.

## 2011-08-19 LAB — PROTEIN, URINE, 24 HOUR
Collection Interval-UPROT: 24 hours
Protein, 24H Urine: 132 mg/d — ABNORMAL HIGH (ref 50–100)
Protein, Urine: 12 mg/dL

## 2011-08-19 LAB — CREATININE CLEARANCE, URINE, 24 HOUR
Collection Interval-CRCL: 24 hours
Creatinine, Urine: 192.18 mg/dL
Creatinine: 0.71 mg/dL (ref 0.50–1.10)
Urine Total Volume-CRCL: 1100 mL

## 2011-08-19 LAB — GLUCOSE, CAPILLARY

## 2011-08-19 MED ORDER — METHYLDOPA 500 MG PO TABS: 500.0000 mg | ORAL_TABLET | Freq: Three times a day (TID) | ORAL | Status: DC

## 2011-08-19 MED ORDER — PRENATAL PLUS 27-1 MG PO TABS: 1.0000 | ORAL_TABLET | Freq: Every day | ORAL | Status: DC

## 2011-08-19 MED ORDER — INSULIN NPH (HUMAN) (ISOPHANE) 100 UNIT/ML ~~LOC~~ SUSP: 42.0000 [IU] | Freq: Every day | SUBCUTANEOUS | Status: DC

## 2011-08-19 MED ORDER — INSULIN ASPART 100 UNIT/ML ~~LOC~~ SOLN
25.0000 [IU] | Freq: Two times a day (BID) | SUBCUTANEOUS | Status: DC
  Filled 2011-08-19: qty 3

## 2011-08-19 MED ORDER — INSULIN NPH (HUMAN) (ISOPHANE) 100 UNIT/ML ~~LOC~~ SUSP: 40.0000 [IU] | Freq: Every day | SUBCUTANEOUS | Status: DC

## 2011-08-19 MED ORDER — INSULIN ASPART 100 UNIT/ML ~~LOC~~ SOLN: 25.0000 [IU] | Freq: Two times a day (BID) | SUBCUTANEOUS | Status: DC

## 2011-08-19 NOTE — Progress Notes (Signed)
D/C instructions reviewed with pt and understanding verlbalized.

## 2011-08-19 NOTE — Progress Notes (Signed)
Anna Hawkins is a 35 y.o. [id number] at [redacted]w[redacted]d who is admitted for [facility] r/o pre-eclampsia.  Subjective: No complaints, ctx, bldg or leaking. Completed 24 hr urine at 3am.  Objective: Blood pressure 121/50, pulse 94, temperature 97.7 F (36.5 C), temperature source Oral, resp. rate 20, height 5\' 2"  (1.575 m), weight 152.862 kg (337 lb). FBS: 68; CBGs during day 8/28 ranged from 47-177 Physical Exam:  General: alert and cooperative FM: reactive NST 8/28 with ctx; pending for today   Basename 08/18/11 0100  HGB 9.7*  HCT 30.4*    Assessment/Plan: Will review plan on care once 24 hr urine is resulted. CBGs will be rev'd at rounds to assess for any needed insulin changes.   LOS: 2 days   SHAW,KIMBERLY B 08/19/2011, 8:15 AM

## 2011-08-19 NOTE — Discharge Summary (Signed)
Physician Discharge Summary  Patient ID: Anna Hawkins MRN: [id number] DOB/AGE: 1976/05/05 35 y.o.  Admit date: 08/17/2011 Discharge date: 08/19/2011  Admission Diagnoses: 35yo [id number] with GDM B and CHTN admitted for observation to rule out pre-eclampsia  Discharge Diagnoses: GDM B and CHTN Active Problems:  * No active hospital problems. *    Discharged Condition: stable  Hospital Course: 35 yo [id number] admitted with elevated BP to rule out pre-eclampsia. Fetal status remained reassuring throughout her hospitalization.  Her blood pressure improved after changing her current regimen to a TID dosing. Her glycemic regimen was slightly changed to optimize her glycemic control. PIH panel was within normal limits with PTL 300k, AST/ALT 9/13 and 24hr urine protein of 132mg .  Consults: none  Significant Diagnostic Studies: labs: see above paragraph  Treatments: insulin: Humalog and NPH  Discharge Exam: Blood pressure 139/66, pulse 91, temperature 99 F (37.2 C), temperature source Oral, resp. rate 20, height 5\' 2"  (1.575 m), weight 150.821 kg (332 lb 8 oz). General appearance: alert, cooperative and no distress GI: normal findings: soft, non-tender and gravid Extremities: extremities normal, atraumatic, no cyanosis or edema, no edema, redness or tenderness in the calves or thighs and 2+ DTR  Disposition: Home or Self Care  Discharge Orders    Future Appointments: Provider: Department: Dept Phone: Center:   08/20/2011 1:00 PM [facility] [phone] [facility]   08/25/2011 9:00 AM [facility] [phone] [facility]   08/27/2011 9:30 AM [facility] [phone] [facility]   08/31/2011 6:30 AM [facility] Schedule  None     Future Orders Please Complete By Expires   Discharge instructions      Comments:   Follow-up in clinic as scheduled. Please return to MAU if you have a persistent headache not relieved by Tylenol, visual  disturbances, or abdominal pain. Also come to be evaluate if you experience decreased fetal movement, leakage of fluid, vaginal bleeding or regular contractions   LABOR:  When conractions begin, you should start to time them from the beginning of one contraction to the beginning  of the next.  When contractions are 5 - 10 minutes apart or less and have been regular for at least an hour, you should call your health care provider.      Notify physician for bleeding from the vagina      Notify physician for pain or burning when urinating      Notify physician for chills or fever      Notify physician for increase in vaginal discharge      Notify physician for pelvic pressure (sudden increase)      Notify physician if baby moving less than usual      Notify physician for sudden, constant, or occasional abdominal pain      Notify physician for sudden gushing of fluid from the vagina (with or without continued leaking)      Notify physician for leaking of fluid      Notify physician for fainting spells, "black outs" or loss of consciousness      Notify physician for severe or continued nausea or vomiting      Notify physician for blurring of vision or spots before the eyes      Fetal Kick Count:  Lie on our left side for one hour after a meal, and count the number of times your baby kicks.  If it is less than 5 times, get up, move around and drink some  juice.  Repeat the test 30 minutes later.  If it is still less than 5 kicks in an hour, notify your doctor.      Discharge activity:  No Restrictions      Scheduling Instructions:   No specific restrictions but overall take it easy until your scheduled induction date.   Discharge diet:      Scheduling Instructions:   Carbohydrate modified diet    Do not have sex or do anything that might make you have an orgasm        Current Discharge Medication List    START taking these medications   Details  insulin aspart (NOVOLOG) 100 UNIT/ML injection  Inject 25 Units into the skin 2 (two) times daily before a meal. Qty: 10 mL, Refills: 1    prenatal vitamin w/FE, FA (PRENATAL 1 + 1) 27-1 MG TABS Take 1 tablet by mouth daily. Qty: 30 each, Refills: 1      CONTINUE these medications which have CHANGED   Details  !! insulin NPH (HUMULIN N,NOVOLIN N) 100 UNIT/ML injection Inject 40 Units into the skin daily before breakfast. Qty: 10 mL, Refills: 1    !! insulin NPH (HUMULIN N,NOVOLIN N) 100 UNIT/ML injection Inject 42 Units into the skin at bedtime. Qty: 10 mL, Refills: 1    methyldopa (ALDOMET) 500 MG tablet Take 1 tablet (500 mg total) by mouth 3 (three) times daily. Qty: 90 tablet, Refills: 1     !! - Potential duplicate medications found. Please discuss with provider.    CONTINUE these medications which have NOT CHANGED   Details  albuterol (PROVENTIL,VENTOLIN) 90 MCG/ACT inhaler Inhale 2 puffs into the lungs every 6 (six) hours as needed. For shortness of breath     !! hydrOXYzine (VISTARIL) 50 MG capsule Take 50 mg by mouth 3 (three) times daily as needed. For cramping    !! hydrOXYzine (VISTARIL) 50 MG capsule Take 50 mg by mouth 3 (three) times daily as needed. For cramping      !! - Potential duplicate medications found. Please discuss with provider.    STOP taking these medications     insulin regular (HUMULIN R,NOVOLIN R) 100 units/mL injection        Follow-up Information    Follow up with [facility]. (keep your scheduled appointment)    Contact information:   [address] [place] [address]          Signed: CONSTANT,PEGGY 08/19/2011, 1:45 PM

## 2011-08-20 ENCOUNTER — Ambulatory Visit (INDEPENDENT_AMBULATORY_CARE_PROVIDER_SITE_OTHER): Payer: Medicaid Other | Admitting: *Deleted

## 2011-08-20 DIAGNOSIS — O169 Unspecified maternal hypertension, unspecified trimester: Secondary | ICD-10-CM

## 2011-08-20 DIAGNOSIS — O24919 Unspecified diabetes mellitus in pregnancy, unspecified trimester: Secondary | ICD-10-CM

## 2011-08-20 DIAGNOSIS — O09529 Supervision of elderly multigravida, unspecified trimester: Secondary | ICD-10-CM

## 2011-08-25 ENCOUNTER — Ambulatory Visit (INDEPENDENT_AMBULATORY_CARE_PROVIDER_SITE_OTHER): Payer: Medicaid Other | Admitting: Obstetrics & Gynecology

## 2011-08-25 DIAGNOSIS — O09529 Supervision of elderly multigravida, unspecified trimester: Secondary | ICD-10-CM

## 2011-08-25 DIAGNOSIS — O24919 Unspecified diabetes mellitus in pregnancy, unspecified trimester: Secondary | ICD-10-CM

## 2011-08-25 DIAGNOSIS — O169 Unspecified maternal hypertension, unspecified trimester: Secondary | ICD-10-CM

## 2011-08-25 NOTE — Progress Notes (Signed)
Reactive NST 

## 2011-08-25 NOTE — Progress Notes (Signed)
P= 86  

## 2011-08-27 ENCOUNTER — Other Ambulatory Visit: Payer: Self-pay | Admitting: Family Medicine

## 2011-08-27 ENCOUNTER — Ambulatory Visit (INDEPENDENT_AMBULATORY_CARE_PROVIDER_SITE_OTHER): Payer: Medicaid Other | Admitting: Family Medicine

## 2011-08-27 ENCOUNTER — Inpatient Hospital Stay (HOSPITAL_COMMUNITY)
Admission: AD | Admit: 2011-08-27 | Discharge: 2011-08-30 | DRG: 774 | Disposition: A | Discharge status: Home / Self Care | Payer: Medicaid Other | Source: Ambulatory Visit | Attending: Obstetrics & Gynecology | Admitting: Obstetrics & Gynecology

## 2011-08-27 ENCOUNTER — Encounter (HOSPITAL_COMMUNITY): Payer: Self-pay | Admitting: *Deleted

## 2011-08-27 DIAGNOSIS — O10019 Pre-existing essential hypertension complicating pregnancy, unspecified trimester: Secondary | ICD-10-CM

## 2011-08-27 DIAGNOSIS — IMO0002 Reserved for concepts with insufficient information to code with codable children: Secondary | ICD-10-CM

## 2011-08-27 DIAGNOSIS — O24919 Unspecified diabetes mellitus in pregnancy, unspecified trimester: Secondary | ICD-10-CM

## 2011-08-27 DIAGNOSIS — O1002 Pre-existing essential hypertension complicating childbirth: Secondary | ICD-10-CM | POA: Diagnosis present

## 2011-08-27 DIAGNOSIS — O09529 Supervision of elderly multigravida, unspecified trimester: Secondary | ICD-10-CM

## 2011-08-27 DIAGNOSIS — O165 Unspecified maternal hypertension, complicating the puerperium: Secondary | ICD-10-CM

## 2011-08-27 DIAGNOSIS — O99814 Abnormal glucose complicating childbirth: Secondary | ICD-10-CM | POA: Diagnosis present

## 2011-08-27 DIAGNOSIS — O1490 Unspecified pre-eclampsia, unspecified trimester: Secondary | ICD-10-CM

## 2011-08-27 DIAGNOSIS — IMO0001 Reserved for inherently not codable concepts without codable children: Secondary | ICD-10-CM

## 2011-08-27 DIAGNOSIS — O169 Unspecified maternal hypertension, unspecified trimester: Secondary | ICD-10-CM

## 2011-08-27 LAB — GLUCOSE, CAPILLARY
Glucose-Capillary: 81 mg/dL (ref 70–99)
Glucose-Capillary: 88 mg/dL (ref 70–99)
Glucose-Capillary: 92 mg/dL (ref 70–99)
Glucose-Capillary: 96 mg/dL (ref 70–99)

## 2011-08-27 LAB — CBC
HCT: 31.8 % — ABNORMAL LOW (ref 36.0–46.0)
MCV: 72.9 fL — ABNORMAL LOW (ref 78.0–100.0)
RBC: 4.36 MIL/uL (ref 3.87–5.11)
WBC: 14.5 10*3/uL — ABNORMAL HIGH (ref 4.0–10.5)

## 2011-08-27 LAB — URINALYSIS, MICROSCOPIC ONLY
Glucose, UA: NEGATIVE mg/dL
Hgb urine dipstick: NEGATIVE
Leukocytes, UA: NEGATIVE
Specific Gravity, Urine: 1.01 (ref 1.005–1.030)
Urobilinogen, UA: 0.2 mg/dL (ref 0.0–1.0)

## 2011-08-27 LAB — COMPREHENSIVE METABOLIC PANEL
AST: 14 U/L (ref 0–37)
Albumin: 2.3 g/dL — ABNORMAL LOW (ref 3.5–5.2)
Alkaline Phosphatase: 210 U/L — ABNORMAL HIGH (ref 39–117)
Chloride: 106 mEq/L (ref 96–112)
Potassium: 3.4 mEq/L — ABNORMAL LOW (ref 3.5–5.1)
Sodium: 136 mEq/L (ref 135–145)
Total Bilirubin: 0.3 mg/dL (ref 0.3–1.2)

## 2011-08-27 LAB — POCT URINALYSIS DIP (DEVICE)
Bilirubin Urine: NEGATIVE
Glucose, UA: NEGATIVE mg/dL
Ketones, ur: NEGATIVE mg/dL
Specific Gravity, Urine: 1.025 (ref 1.005–1.030)

## 2011-08-27 MED ORDER — OXYTOCIN 20 UNITS IN LACTATED RINGERS INFUSION - SIMPLE
125.0000 mL/h | Freq: Once | INTRAVENOUS | Status: DC
  Administered 2011-08-28: 999 mL/h via INTRAVENOUS

## 2011-08-27 MED ORDER — BUTORPHANOL TARTRATE 2 MG/ML IJ SOLN
1.0000 mg | INTRAMUSCULAR | Status: DC | PRN
  Filled 2011-08-27: qty 1

## 2011-08-27 MED ORDER — MAGNESIUM SULFATE 40 G IN LACTATED RINGERS - SIMPLE
2.0000 g/h | INTRAVENOUS | Status: DC
  Administered 2011-08-27: 4 g/h via INTRAVENOUS
  Administered 2011-08-28: 2 g/h via INTRAVENOUS
  Filled 2011-08-27: qty 500

## 2011-08-27 MED ORDER — LIDOCAINE HCL (PF) 1 % IJ SOLN
30.0000 mL | INTRAMUSCULAR | Status: DC | PRN
  Filled 2011-08-27: qty 30

## 2011-08-27 MED ORDER — ONDANSETRON HCL 4 MG/2ML IJ SOLN
4.0000 mg | Freq: Four times a day (QID) | INTRAMUSCULAR | Status: DC | PRN
  Administered 2011-08-28: 4 mg via INTRAVENOUS
  Filled 2011-08-27: qty 2

## 2011-08-27 MED ORDER — DEXTROSE 50 % IV SOLN: 25.0000 mL | INTRAVENOUS | Status: DC | PRN

## 2011-08-27 MED ORDER — SODIUM CHLORIDE 0.9 % IV SOLN
INTRAVENOUS | Status: DC
  Administered 2011-08-27 – 2011-08-28 (×3): via INTRAVENOUS

## 2011-08-27 MED ORDER — FLEET ENEMA 7-19 GM/118ML RE ENEM: 1.0000 | ENEMA | RECTAL | Status: DC | PRN

## 2011-08-27 MED ORDER — OXYTOCIN BOLUS FROM INFUSION
500.0000 mL | Freq: Once | INTRAVENOUS | Status: DC
  Filled 2011-08-27: qty 500
  Filled 2011-08-27 (×2): qty 1000

## 2011-08-27 MED ORDER — IBUPROFEN 600 MG PO TABS
600.0000 mg | ORAL_TABLET | Freq: Four times a day (QID) | ORAL | Status: DC | PRN
  Administered 2011-08-28: 600 mg via ORAL
  Filled 2011-08-27: qty 1

## 2011-08-27 MED ORDER — ALBUTEROL 90 MCG/ACT IN AERS: 2.0000 | INHALATION_SPRAY | Freq: Four times a day (QID) | RESPIRATORY_TRACT | Status: DC | PRN

## 2011-08-27 MED ORDER — TERBUTALINE SULFATE 1 MG/ML IJ SOLN: 0.2500 mg | Freq: Once | INTRAMUSCULAR | Status: AC | PRN

## 2011-08-27 MED ORDER — MISOPROSTOL 25 MCG QUARTER TABLET
25.0000 ug | ORAL_TABLET | ORAL | Status: DC | PRN
  Administered 2011-08-27 – 2011-08-28 (×3): 25 ug via VAGINAL
  Filled 2011-08-27: qty 0.25
  Filled 2011-08-27: qty 1
  Filled 2011-08-27 (×2): qty 0.25

## 2011-08-27 MED ORDER — SODIUM CHLORIDE 0.9 % IV SOLN
INTRAVENOUS | Status: DC
  Administered 2011-08-27: 0.2 [IU]/h via INTRAVENOUS
  Filled 2011-08-27: qty 1

## 2011-08-27 MED ORDER — OXYCODONE-ACETAMINOPHEN 5-325 MG PO TABS: 2.0000 | ORAL_TABLET | ORAL | Status: DC | PRN

## 2011-08-27 MED ORDER — MAGNESIUM SULFATE BOLUS VIA INFUSION
4.0000 g | Freq: Once | INTRAVENOUS | Status: DC
  Filled 2011-08-27: qty 500

## 2011-08-27 MED ORDER — METHYLDOPA 500 MG PO TABS
500.0000 mg | ORAL_TABLET | Freq: Three times a day (TID) | ORAL | Status: DC
  Administered 2011-08-27 – 2011-08-30 (×9): 500 mg via ORAL
  Filled 2011-08-27 (×13): qty 1

## 2011-08-27 MED ORDER — LABETALOL HCL 5 MG/ML IV SOLN
10.0000 mg | Freq: Once | INTRAVENOUS | Status: AC
  Administered 2011-08-27: 10 mg via INTRAVENOUS
  Filled 2011-08-27: qty 4

## 2011-08-27 MED ORDER — ZOLPIDEM TARTRATE 10 MG PO TABS
10.0000 mg | ORAL_TABLET | Freq: Every evening | ORAL | Status: DC | PRN
Start: 2011-08-27 — End: 2011-08-30
  Administered 2011-08-28 (×2): 10 mg via ORAL
  Filled 2011-08-27 (×2): qty 1

## 2011-08-27 MED ORDER — CITRIC ACID-SODIUM CITRATE 334-500 MG/5ML PO SOLN: 30.0000 mL | ORAL | Status: DC | PRN

## 2011-08-27 MED ORDER — ALBUTEROL SULFATE HFA 108 (90 BASE) MCG/ACT IN AERS
2.0000 | INHALATION_SPRAY | Freq: Four times a day (QID) | RESPIRATORY_TRACT | Status: DC | PRN
  Filled 2011-08-27: qty 6.7

## 2011-08-27 MED ORDER — LACTATED RINGERS IV SOLN
500.0000 mL | INTRAVENOUS | Status: DC | PRN
Start: 2011-08-27 — End: 2011-08-28

## 2011-08-27 MED ORDER — ACETAMINOPHEN 325 MG PO TABS: 650.0000 mg | ORAL_TABLET | ORAL | Status: DC | PRN

## 2011-08-27 MED ORDER — DEXTROSE-NACL 5-0.45 % IV SOLN
INTRAVENOUS | Status: DC
  Administered 2011-08-27: 100 mL/h via INTRAVENOUS
  Administered 2011-08-28: 03:00:00 via INTRAVENOUS

## 2011-08-27 NOTE — ED Provider Notes (Signed)
See admission H&P

## 2011-08-27 NOTE — Progress Notes (Signed)
Anna Hawkins is a 35 y.o. [id number] at [redacted]w[redacted]d by ultrasound admitted for induction of labor due to Hypertension/PreEclampsia.  Subjective: Patient denies any symptoms of SOB or chest pain, is able to move easily in the bed. Is not feeling much cramping or contractions.  Objective: BP 153/73  Pulse 90  Temp(Src) 98 F (36.7 C) (Oral)  Resp 18  Ht 5\' 4"  (1.626 m)  Wt 159.213 kg (351 lb)  BMI 60.25 kg/m2  SpO2 100%   Total I/O In: 504.3 [P.O.:30; I.V.:474.3] Out: 75 [Urine:75]  FHT:  FHR: 130 bpm, variability: moderate,  accelerations:  Present,  decelerations:  Absent UC:   none SVE:   Dilation: 1 Effacement (%): 50 Station: -2 Exam by:: Dr. Elwyn Reach  Labs: Lab Results  Component Value Date   WBC 14.5* 08/27/2011   HGB 10.2* 08/27/2011   HCT 31.8* 08/27/2011   MCV 72.9* 08/27/2011   PLT 270 08/27/2011    Assessment / Plan: IOL, just beginning. Cytotec just placed less than 1 hour ago Preeclampsia:  on magnesium sulfate and no signs or symptoms of toxicity Fetal Wellbeing:  Category I Pain Control:  Labor support without medications I/D:  Hawkins/a: GBS negative, afebrile. GDM: on glucostabilizer, most recent CBG was 88, not currently on insulin or D5 Anticipated MOD:  NSVD  Anna Hawkins 08/27/2011, 6:24 PM

## 2011-08-27 NOTE — Progress Notes (Signed)
Dr. Debroah Loop at bedside and notified of pt status, FHR, UC pattern, pt denies pain, magnesium sulfate started, aldomet given, and insulin started. Dr. Debroah Loop confirmed orders, will continue to monitor.

## 2011-08-27 NOTE — Progress Notes (Signed)
Pt sent up from clinic due to elevated blood pressures.  Pt reports dizziness since this morning at 0700, denies any headache, blurred vision, or epigastric pain.  Reports decreased swelling.  + FM.  Denies any bleeding, leaking of fluid, or abdominal pain.

## 2011-08-27 NOTE — Progress Notes (Signed)
  Subjective:    Anna Hawkins is a 35 y.o. [id number] [redacted]w[redacted]d being seen today for her obstetrical visit.  Patient reports dizziness, denies headache, RUQ pain, vision changes. Fetal movement: normal.  Objective:    BP 172/77  Temp 97.1 F (36.2 C)  Wt 351 lb 6.4 oz (159.394 kg)  Physical Exam  Exam  FHT:  140 BPM  Uterine Size: size greater than dates  Presentation: cephalic     Assessment:    Pregnancy:  [id number]    Plan:    Patient Active Problem List  Diagnoses  . Diabetes mellitus, antepartum  . Unspecified hypertension antepartum  . Elderly multigravida with antepartum condition or complication  . Chlamydia    Markedly elevated BP's with new onset proteinuria--to MAU Follow up in 6 wks, pp check.

## 2011-08-27 NOTE — Progress Notes (Signed)
Anna Hawkins is a 35 y.o. [id number] at [redacted]w[redacted]d Subjective:  Resting comfortably. Weakly feeling contractions. No LOF. Good FM. No other concerns.   Objective: BP 163/82  Pulse 101  Temp(Src) 98 F (36.7 C) (Oral)  Resp 18  Ht 5\' 4"  (1.626 m)  Wt 351 lb (159.213 kg)  BMI 60.25 kg/m2  SpO2 100% I/O last 3 completed shifts: In: 504.3 [P.O.:30; I.V.:474.3] Out: 75 [Urine:75] Total I/O In: 127.1 [I.V.:127.1] Out: 0   FHT:  FHR: 135 bpm, variability: moderate,  accelerations:  Present,  decelerations:  Present ?Variable - difficult to establish baseline, good return to baseline and variability after UC:   irregular, every 2-8 minutes SVE:   Dilation: 1 Effacement (%): 50 Station: -2 Exam by:: Dr. Elwyn Reach  Labs: Lab Results  Component Value Date   WBC 14.5* 08/27/2011   HGB 10.2* 08/27/2011   HCT 31.8* 08/27/2011   MCV 72.9* 08/27/2011   PLT 270 08/27/2011    Assessment / Plan: IOL 2/2 Pre-E. S/p cytotec x2. Will place foley bulb when appropriate  Labor: continue cytotec Preeclampsia:  on magnesium sulfate and no signs or symptoms of toxicity; will continue to monitor Fetal Wellbeing:  Category II Pain Control:  PO/IV medications PRN I/D:  GBS- Anticipated MOD:  NSVD  Kwane Rohl, Beverely Pace A 08/27/2011, 10:32 PM

## 2011-08-27 NOTE — Progress Notes (Signed)
Pt sent from the [facility] clinic for evaluation of elevated BP. No pain only pressure.

## 2011-08-27 NOTE — Progress Notes (Signed)
Anna Hawkins is a 35 y.o. [id number] at [redacted]w[redacted]d by LMP admitted for induction of labor due to Pre-eclamptic toxemia of pregnancy..  Subjective: No c/o headache or scotomata  Objective: BP 159/81  Pulse 82  Temp(Src) 98.7 F (37.1 C) (Oral)  Resp 24  Ht 5\' 4"  (1.626 m)  Wt 159.213 kg (351 lb)  BMI 60.25 kg/m2  SpO2 100%      FHT:  FHR: 140 bpm, variability: moderate,  accelerations:  Present,  decelerations:  Absent UC:   rare SVE:   Dilation: 1 Effacement (%): 50 Station: -3 Exam by:: funches  Labs: Lab Results  Component Value Date   WBC 14.5* 08/27/2011   HGB 10.2* 08/27/2011   HCT 31.8* 08/27/2011   MCV 72.9* 08/27/2011   PLT 270 08/27/2011    Assessment / Plan: Pre-eclampsia, diabetes, history of shoulder dystocia  Labor: Cervical ripenig Preeclampsia:  on magnesium sulfate Fetal Wellbeing:  Category I Pain Control:  Labor support without medications I/D:  n/a Anticipated MOD:  Discussed risk of recurrent dystocia vs. risk of primaryn cesarean delivery: pt elects to contunue cervical ripening and induction of labor   Aleatha Taite 08/27/2011, 4:47 PM

## 2011-08-27 NOTE — Progress Notes (Signed)
Anna Hawkins is a 35 y.o. [id number] at [redacted]w[redacted]d by ultrasound admitted for induction of labor due to pre-eclampsia.  Subjective: Doing well.    Objective: BP 153/73  Pulse 90  Temp(Src) 98 F (36.7 C) (Oral)  Resp 18  Ht 5\' 4"  (1.626 m)  Wt 351 lb (159.213 kg)  BMI 60.25 kg/m2  SpO2 100%   Total I/O In: 485.6 [P.O.:30; I.V.:455.6] Out: 75 [Urine:75]  FHT:  FHR: 140 bpm, variability: moderate,  accelerations:  Abscent,  decelerations:  Absent UC:   none SVE:   Dilation: 1 Effacement (%): 50 Station: -2 Exam by:: Dr. Elwyn Reach  Labs: Lab Results  Component Value Date   WBC 14.5* 08/27/2011   HGB 10.2* 08/27/2011   HCT 31.8* 08/27/2011   MCV 72.9* 08/27/2011   PLT 270 08/27/2011    Assessment / Plan: Induction of labor due to preeclampsia,  progressing well on pitocin  Labor: cytotec placed at 1730 Preeclampsia:  on magnesium sulfate Fetal Wellbeing:  Category I Pain Control:  Labor support without medications I/D:  n/a Anticipated MOD:  NSVD  BOOTH, Rembert Browe 08/27/2011, 6:08 PM

## 2011-08-27 NOTE — Progress Notes (Signed)
Pulse 98. Pelvic pressure. No vaginal discharge. Feeling dizzy today. Pt states no headaches.

## 2011-08-27 NOTE — H&P (Signed)
Anna Hawkins is a 35 y.o. female presenting for HTN in pregnancy.  She is  [redacted]w[redacted]d. Her EDD is 09/06/11  based on [redacted]w[redacted]d ultrasound.  She reports elevated BP throughout her pregnancy but has not required anti-hypertensive medication. She also reports dizziness since this AM and tingling in her extremities since around noon. She denies blurred vision or visual disturbance, chest pain, SOB, N/V, abdominal pain, contractions, vaginal bleeding, LOF, edema. She reports positive fetal movement.  She was evaluated in high risk clinic today and sent to [facility]U for evaluation of her high blood pressure.   History OB History    Grav Para Term Preterm Abortions TAB SAB Ect Mult Living   7 2 2  0 4 2 2  0 0 2     Past Medical History  Diagnosis Date  . Asthma   . Chlamydia   . Obesity   . Gestational diabetes   . Pregnancy induced hypertension   . Hypothyroidism   . Fatty liver   . Hx of shoulder dystocia, prior pregnancy, currently pregnant    Past Surgical History  Procedure Date  . Therapeutic abortion   . Wisdom tooth extraction    Family History: family history includes Diabetes in her brother and mother and Hypertension in her brother and mother. Social History:  reports that she has never smoked. She has never used smokeless tobacco. She reports that she does not drink alcohol or use illicit drugs.  ROS Comprehensive ROS negative, except as per HPI  Dilation: 1 Effacement (%): 50 Station: -3 Exam by:: Lyan Moyano Blood pressure 172/93, pulse 94, temperature 98.7 F (37.1 C), temperature source Oral, resp. rate 24, height 5\' 4"  (1.626 m), weight 351 lb 9.6 oz (159.485 kg), SpO2 100.00%. Exam Physical Exam  Nursing note and vitals reviewed. Constitutional: She is oriented to person, place, and time. She appears well-developed and well-nourished.       Obese Mild distress  HENT:  Head: Normocephalic and atraumatic.  Eyes: Conjunctivae and EOM are normal. Pupils are equal, round, and  reactive to light. No scleral icterus.  Cardiovascular: Normal rate, regular rhythm, normal heart sounds and intact distal pulses.   Respiratory: Effort normal.       Breath sounds clear, decreased movement bilaterally. No wheezing.  GI: Soft. Bowel sounds are normal.       Gravid Obese   Genitourinary: Vagina normal and uterus normal.  Musculoskeletal: Normal range of motion. She exhibits no edema and no tenderness.  Neurological: She is alert and oriented to person, place, and time. She exhibits normal muscle tone.       No clonus DTRs difficult to asses, 1+, symmetric  Skin: Skin is warm and dry.       Acanthosis nigricans on posterior neck   Psychiatric: She has a normal mood and affect. Her behavior is normal. Judgment and thought content normal.    FHR: 140, no decels, no accels, moderate variability Toco: none   Prenatal labs: ABO, Rh: --/--/O POS (02/27 2137) Antibody: NEG (02/27 2137) Rubella:  Equivocal  RPR: NON REAC (07/09 0952)  HBsAg: NEGATIVE (02/27 1056)  HIV: NON REACTIVE (07/09 0952)  GBS: NEGATIVE (08/27 1517)   Lab Results  Component Value Date   WBC 14.5* 08/27/2011   HGB 10.2* 08/27/2011   HCT 31.8* 08/27/2011   MCV 72.9* 08/27/2011   PLT 270 08/27/2011   Lab Results  Component Value Date   CREATININE <0.47* 08/27/2011   BUN 11 08/27/2011   NA 136 08/27/2011  K 3.4* 08/27/2011   CL 106 08/27/2011   CO2 20 08/27/2011   Lab Results  Component Value Date   ALT 9 08/27/2011   AST 14 08/27/2011   ALKPHOS 210* 08/27/2011   BILITOT 0.3 08/27/2011   Last blood glucose: 60   Assessment/Plan: 35 yo multigravida at  38.4 presents with gestational hypertension. 1. Gestational HTN: pre-eclampsia labs are wnl, f/u Urine Pr/Cr. Start IV labetalol and magnesium. 2. Dispo: admit to L&D for IOL. Plan to induce with cytotec. 3. Pain control: stadol, epidural upon request. 4. Diabetes: glucose stabilizer.  5. Diet: clear liquid.   Juanya Villavicencio 08/27/2011, 3:25 PM

## 2011-08-27 NOTE — Progress Notes (Signed)
Dr. Natale Milch at bedside and notified of pt status, FHR, UC pattern, pt's BPs and CBGs.  Will continue to monitor.

## 2011-08-28 ENCOUNTER — Encounter (HOSPITAL_COMMUNITY): Payer: Self-pay | Admitting: Anesthesiology

## 2011-08-28 ENCOUNTER — Inpatient Hospital Stay (HOSPITAL_COMMUNITY): Payer: Medicaid Other | Admitting: Anesthesiology

## 2011-08-28 DIAGNOSIS — O1002 Pre-existing essential hypertension complicating childbirth: Secondary | ICD-10-CM

## 2011-08-28 DIAGNOSIS — O99814 Abnormal glucose complicating childbirth: Secondary | ICD-10-CM

## 2011-08-28 DIAGNOSIS — O165 Unspecified maternal hypertension, complicating the puerperium: Secondary | ICD-10-CM

## 2011-08-28 DIAGNOSIS — IMO0002 Reserved for concepts with insufficient information to code with codable children: Secondary | ICD-10-CM

## 2011-08-28 LAB — GLUCOSE, CAPILLARY
Glucose-Capillary: 187 mg/dL — ABNORMAL HIGH (ref 70–99)
Glucose-Capillary: 170 mg/dL — ABNORMAL HIGH (ref 70–99)
Glucose-Capillary: 141 mg/dL — ABNORMAL HIGH (ref 70–99)
Glucose-Capillary: 150 mg/dL — ABNORMAL HIGH (ref 70–99)
Glucose-Capillary: 145 mg/dL — ABNORMAL HIGH (ref 70–99)
Glucose-Capillary: 139 mg/dL — ABNORMAL HIGH (ref 70–99)
Glucose-Capillary: 142 mg/dL — ABNORMAL HIGH (ref 70–99)
Glucose-Capillary: 122 mg/dL — ABNORMAL HIGH (ref 70–99)
Glucose-Capillary: 103 mg/dL — ABNORMAL HIGH (ref 70–99)
Glucose-Capillary: 170 mg/dL — ABNORMAL HIGH (ref 70–99)

## 2011-08-28 LAB — CBC
HCT: 33.2 % — ABNORMAL LOW (ref 36.0–46.0)
Hemoglobin: 10.6 g/dL — ABNORMAL LOW (ref 12.0–15.0)
MCH: 23.3 pg — ABNORMAL LOW (ref 26.0–34.0)
MCHC: 31.9 g/dL (ref 30.0–36.0)
MCV: 73 fL — ABNORMAL LOW (ref 78.0–100.0)
Platelets: 284 10*3/uL (ref 150–400)
Platelets: 285 10*3/uL (ref 150–400)
RBC: 4.55 MIL/uL (ref 3.87–5.11)
RDW: 15.2 % (ref 11.5–15.5)
RDW: 15.4 % (ref 11.5–15.5)
WBC: 15.1 10*3/uL — ABNORMAL HIGH (ref 4.0–10.5)
WBC: 22.5 10*3/uL — ABNORMAL HIGH (ref 4.0–10.5)

## 2011-08-28 LAB — COMPREHENSIVE METABOLIC PANEL
ALT: 10 U/L (ref 0–35)
BUN: 10 mg/dL (ref 6–23)
CO2: 21 mEq/L (ref 19–32)
Calcium: 8.9 mg/dL (ref 8.4–10.5)
Creatinine, Ser: 0.6 mg/dL (ref 0.50–1.10)
GFR calc Af Amer: >60 mL/min (ref >60)
GFR calc non Af Amer: >60 mL/min (ref >60)
Glucose, Bld: 105 mg/dL — ABNORMAL HIGH (ref 70–99)
Total Protein: 7 g/dL (ref 6.0–8.3)

## 2011-08-28 LAB — RPR: RPR Ser Ql: NONREACTIVE

## 2011-08-28 LAB — MAGNESIUM: Magnesium: 5.4 mg/dL — ABNORMAL HIGH (ref 1.5–2.5)

## 2011-08-28 MED ORDER — PHENYLEPHRINE 40 MCG/ML (10ML) SYRINGE FOR IV PUSH (FOR BLOOD PRESSURE SUPPORT)
80.0000 ug | PREFILLED_SYRINGE | INTRAVENOUS | Status: DC | PRN
  Filled 2011-08-28 (×2): qty 5

## 2011-08-28 MED ORDER — LABETALOL HCL 5 MG/ML IV SOLN
20.0000 mg | Freq: Once | INTRAVENOUS | Status: AC
  Administered 2011-08-28: 20 mg via INTRAVENOUS

## 2011-08-28 MED ORDER — FENTANYL 2.5 MCG/ML BUPIVACAINE 1/10 % EPIDURAL INFUSION (WH - ANES)
INTRAMUSCULAR | Status: DC | PRN
  Administered 2011-08-28: 14 mL/h via EPIDURAL

## 2011-08-28 MED ORDER — MISOPROSTOL 200 MCG PO TABS
ORAL_TABLET | ORAL | Status: AC
  Administered 2011-08-28: 1000 ug
  Filled 2011-08-28: qty 5

## 2011-08-28 MED ORDER — NALBUPHINE SYRINGE 5 MG/0.5 ML
10.0000 mg | INJECTION | Freq: Once | INTRAMUSCULAR | Status: AC
  Administered 2011-08-28: 10 mg via INTRAVENOUS

## 2011-08-28 MED ORDER — LANOLIN HYDROUS EX OINT: TOPICAL_OINTMENT | CUTANEOUS | Status: DC | PRN

## 2011-08-28 MED ORDER — EPHEDRINE 5 MG/ML INJ
10.0000 mg | INTRAVENOUS | Status: DC | PRN
  Filled 2011-08-28 (×2): qty 4

## 2011-08-28 MED ORDER — SENNOSIDES-DOCUSATE SODIUM 8.6-50 MG PO TABS
2.0000 | ORAL_TABLET | Freq: Every day | ORAL | Status: DC
  Administered 2011-08-28 – 2011-08-29 (×2): 2 via ORAL

## 2011-08-28 MED ORDER — IBUPROFEN 600 MG PO TABS
600.0000 mg | ORAL_TABLET | Freq: Four times a day (QID) | ORAL | Status: DC
  Administered 2011-08-28 – 2011-08-30 (×7): 600 mg via ORAL
  Filled 2011-08-28 (×8): qty 1

## 2011-08-28 MED ORDER — PHENYLEPHRINE 40 MCG/ML (10ML) SYRINGE FOR IV PUSH (FOR BLOOD PRESSURE SUPPORT)
80.0000 ug | PREFILLED_SYRINGE | INTRAVENOUS | Status: DC | PRN
  Filled 2011-08-28: qty 5

## 2011-08-28 MED ORDER — LACTATED RINGERS IV SOLN
500.0000 mL | Freq: Once | INTRAVENOUS | Status: AC
  Administered 2011-08-28: 500 mL via INTRAVENOUS

## 2011-08-28 MED ORDER — LABETALOL HCL 5 MG/ML IV SOLN
INTRAVENOUS | Status: AC
  Filled 2011-08-28: qty 4

## 2011-08-28 MED ORDER — OXYTOCIN 20 UNITS IN LACTATED RINGERS INFUSION - SIMPLE
1.0000 m[IU]/min | INTRAVENOUS | Status: DC
  Administered 2011-08-28: 2 m[IU]/min via INTRAVENOUS
  Administered 2011-08-28: 8 m[IU]/min via INTRAVENOUS
  Filled 2011-08-28: qty 1000

## 2011-08-28 MED ORDER — LIDOCAINE HCL 1.5 % IJ SOLN
INTRAMUSCULAR | Status: DC | PRN
  Administered 2011-08-28 (×5): 5 mL via EPIDURAL
  Administered 2011-08-28: 2 mL via EPIDURAL

## 2011-08-28 MED ORDER — OXYCODONE-ACETAMINOPHEN 5-325 MG PO TABS: 1.0000 | ORAL_TABLET | ORAL | Status: DC | PRN

## 2011-08-28 MED ORDER — PRENATAL PLUS 27-1 MG PO TABS
1.0000 | ORAL_TABLET | Freq: Every day | ORAL | Status: DC
  Administered 2011-08-29 – 2011-08-30 (×2): 1 via ORAL
  Filled 2011-08-28 (×2): qty 1

## 2011-08-28 MED ORDER — HYDROCHLOROTHIAZIDE 25 MG PO TABS
25.0000 mg | ORAL_TABLET | Freq: Every day | ORAL | Status: DC
  Administered 2011-08-28 – 2011-08-30 (×3): 25 mg via ORAL
  Filled 2011-08-28 (×4): qty 1

## 2011-08-28 MED ORDER — MAGNESIUM SULFATE 40 G IN LACTATED RINGERS - SIMPLE
2.0000 g/h | INTRAVENOUS | Status: AC
  Filled 2011-08-28: qty 500

## 2011-08-28 MED ORDER — TERBUTALINE SULFATE 1 MG/ML IJ SOLN: 0.2500 mg | Freq: Once | INTRAMUSCULAR | Status: DC | PRN

## 2011-08-28 MED ORDER — BENZOCAINE-MENTHOL 20-0.5 % EX AERO: 1.0000 "application " | INHALATION_SPRAY | CUTANEOUS | Status: DC | PRN

## 2011-08-28 MED ORDER — DIBUCAINE 1 % RE OINT: 1.0000 "application " | TOPICAL_OINTMENT | RECTAL | Status: DC | PRN

## 2011-08-28 MED ORDER — BUPIVACAINE HCL (PF) 0.25 % IJ SOLN
INTRAMUSCULAR | Status: DC | PRN
  Administered 2011-08-28 (×2): 5 mL via EPIDURAL

## 2011-08-28 MED ORDER — WITCH HAZEL-GLYCERIN EX PADS: 1.0000 "application " | MEDICATED_PAD | CUTANEOUS | Status: DC | PRN

## 2011-08-28 MED ORDER — ONDANSETRON HCL 4 MG/2ML IJ SOLN: 4.0000 mg | INTRAMUSCULAR | Status: DC | PRN

## 2011-08-28 MED ORDER — NALBUPHINE SYRINGE 5 MG/0.5 ML
10.0000 mg | INJECTION | INTRAMUSCULAR | Status: DC | PRN
  Filled 2011-08-28 (×3): qty 1

## 2011-08-28 MED ORDER — SODIUM CHLORIDE 0.9 % IV SOLN
INTRAVENOUS | Status: DC
  Administered 2011-08-28: 2.5 [IU]/h via INTRAVENOUS
  Administered 2011-08-28: 2.4 [IU]/h via INTRAVENOUS
  Filled 2011-08-28: qty 1

## 2011-08-28 MED ORDER — DIPHENHYDRAMINE HCL 25 MG PO CAPS: 25.0000 mg | ORAL_CAPSULE | Freq: Four times a day (QID) | ORAL | Status: DC | PRN

## 2011-08-28 MED ORDER — ZOLPIDEM TARTRATE 5 MG PO TABS: 5.0000 mg | ORAL_TABLET | Freq: Every evening | ORAL | Status: DC | PRN

## 2011-08-28 MED ORDER — SODIUM CHLORIDE 0.9 % IV SOLN: INTRAVENOUS | Status: DC

## 2011-08-28 MED ORDER — LABETALOL HCL 5 MG/ML IV SOLN
10.0000 mg | INTRAVENOUS | Status: AC
  Administered 2011-08-28: 10 mg via INTRAVENOUS
  Filled 2011-08-28: qty 4

## 2011-08-28 MED ORDER — DIPHENHYDRAMINE HCL 50 MG/ML IJ SOLN: 12.5000 mg | INTRAMUSCULAR | Status: DC | PRN

## 2011-08-28 MED ORDER — FENTANYL 2.5 MCG/ML BUPIVACAINE 1/10 % EPIDURAL INFUSION (WH - ANES)
14.0000 mL/h | INTRAMUSCULAR | Status: DC
  Administered 2011-08-28 (×2): 14 mL/h via EPIDURAL
  Filled 2011-08-28 (×3): qty 60

## 2011-08-28 MED ORDER — EPHEDRINE 5 MG/ML INJ
10.0000 mg | INTRAVENOUS | Status: DC | PRN
  Filled 2011-08-28: qty 4

## 2011-08-28 MED ORDER — SIMETHICONE 80 MG PO CHEW: 80.0000 mg | CHEWABLE_TABLET | ORAL | Status: DC | PRN

## 2011-08-28 MED ORDER — LACTATED RINGERS IV SOLN: INTRAVENOUS | Status: DC

## 2011-08-28 MED ORDER — TETANUS-DIPHTH-ACELL PERTUSSIS 5-2.5-18.5 LF-MCG/0.5 IM SUSP
0.5000 mL | Freq: Once | INTRAMUSCULAR | Status: DC
  Filled 2011-08-28: qty 0.5

## 2011-08-28 MED ORDER — ONDANSETRON HCL 4 MG PO TABS: 4.0000 mg | ORAL_TABLET | ORAL | Status: DC | PRN

## 2011-08-28 NOTE — Progress Notes (Signed)
Operative Delivery Note At 1:43 PM a viable female was delivered via NSVD.  Presentation: vertex; Position: Occiput,, Anterior; Station: +1.  Delivery of the head: without difficulty. Nuchal cord x 1 reduced. Rotated head counter clockwise to try to reduce anterior shoulder.  Second maneuver: posterior arm.  Snap was felt. Once the posterior arm was delivered the rest of the baby followed without difficulty.   APGAR: 1, 9; weight 7 lb 1.9 oz (3229 g).   Placenta status: manually delivered,intact. Sent to pathology.   Cord: 3 vessels with the following complications:nuchal x 1, reduced..  Cord pH: 7.26. Fundal pressure applied. The uterus was difficult to palpate. 1000 mcg of cytotec was given per rectum.   Anesthesia:  Epidural and IV nubain.  Episiotomy: none. Lacerations: none. Suture Repair: n/a. Est. Blood Loss (mL): 300.  Mom to AICU, for 24 hrs of post partum magnesium.  Baby to room in with mom. Dessa Phi 08/28/2011, 2:11 PM

## 2011-08-28 NOTE — Progress Notes (Signed)
Anna Hawkins is a 35 y.o. [id number] at [redacted]w[redacted]d Subjective: Sleeping comfortably.   Objective: BP 171/85  Pulse 98  Temp(Src) 98.6 F (37 C) (Oral)  Resp 18  Ht 5\' 4"  (1.626 m)  Wt 351 lb (159.213 kg)  BMI 60.25 kg/m2  SpO2 100% I/O last 3 completed shifts: In: 504.3 [P.O.:30; I.V.:474.3] Out: 75 [Urine:75] Total I/O In: 127.1 [I.V.:127.1] Out: 0   FHT:  FHR: 145 bpm, variability: moderate,  accelerations:  Present,  decelerations:  Absent UC:   irregular SVE:   Dilation: 1 Effacement (%): 50 Station: -2 Exam by:: Dr. Elwyn Reach  Labs: Lab Results  Component Value Date   WBC 14.5* 08/27/2011   HGB 10.2* 08/27/2011   HCT 31.8* 08/27/2011   MCV 72.9* 08/27/2011   PLT 270 08/27/2011    Assessment / Plan: IOL 2/2 Severe Pre-E, continue cytotec. Next dose available @0130  pending cervical check  Labor: Induction with cytotec Preeclampsia:  on magnesium sulfate Fetal Wellbeing:  Category I Pain Control:  pain medication PRN Anticipated MOD:  NSVD  Anna Hawkins, Beverely Pace A 08/28/2011, 12:22 AM

## 2011-08-28 NOTE — Progress Notes (Signed)
Dr Rodman Pickle at bedside to redose epidural

## 2011-08-28 NOTE — Progress Notes (Signed)
Anna Hawkins is a 35 y.o. [id number] at [redacted]w[redacted]d admitted for induction of labor due to Pre-eclamptic toxemia of pregnancy..  Subjective:   Objective: BP 156/86  Pulse 89  Temp(Src) 98.6 F (37 C) (Oral)  Resp 18  Ht 5\' 4"  (1.626 m)  Wt 159.213 kg (351 lb)  BMI 60.25 kg/m2  SpO2 100% I/O last 3 completed shifts: In: 504.3 [P.O.:30; I.V.:474.3] Out: 75 [Urine:75] Total I/O In: 127.1 [I.V.:127.1] Out: 0   FHT:  FHR: 140 bpm, variability: moderate,  accelerations:  Present,  decelerations:  Absent UC:   none SVE:   Dilation: 1.5 Effacement (%): 60 Station: -3 Exam by:: Dr. Elwyn Hawkins  Labs: Lab Results  Component Value Date   WBC 14.5* 08/27/2011   HGB 10.2* 08/27/2011   HCT 31.8* 08/27/2011   MCV 72.9* 08/27/2011   PLT 270 08/27/2011    Assessment / Plan: 35 y.o. [id number] at [redacted]w[redacted]d IOL for pre-eclampsia, s/p 3rd dose of cytotec  Labor: no labor yet, continue current management plan, will place foley bulb when able Preeclampsia:  on magnesium sulfate and no signs or symptoms of toxicity Fetal Wellbeing:  Category I Pain Control:  may have epidural/IV meds PRN I/D:  n/a Anticipated MOD:  NSVD  Anna Hawkins 08/28/2011, 2:40 AM

## 2011-08-28 NOTE — Progress Notes (Signed)
Anna Hawkins is a 35 y.o. [id number] at [redacted]w[redacted]d admitted for induction of labor due to Pre-eclamptic toxemia of pregnancy..  Subjective: Pt reports increasing pain and c/o vomiting. Would like pain meds.   Objective: BP 154/71  Pulse 92  Temp(Src) 97.7 F (36.5 C) (Oral)  Resp 18  Ht 5\' 4"  (1.626 m)  Wt 159.213 kg (351 lb)  BMI 60.25 kg/m2  SpO2 100% I/O last 3 completed shifts: In: 504.3 [P.O.:30; I.V.:474.3] Out: 75 [Urine:75] Total I/O In: 267.1 [P.O.:140; I.V.:127.1] Out: 550 [Urine:250; Emesis/NG output:300]  FHT:  FHR: 130 bpm, variability: moderate,  accelerations:  Present,  decelerations:  Absent UC:   q2-5 minutes SVE:   Dilation: 1.5 Effacement (%): 60;70 Station: -3 Exam by:: N.Fazier,CNM  Labs: Lab Results  Component Value Date   WBC 14.5* 08/27/2011   HGB 10.2* 08/27/2011   HCT 31.8* 08/27/2011   MCV 72.9* 08/27/2011   PLT 270 08/27/2011    Assessment / Plan: Induction of labor due to preeclampsia,  progressing well on pitocin  Labor: Contractions developing following 3rd dose of cytotec Preeclampsia:  on magnesium sulfate and no signs or symptoms of toxicity Fetal Wellbeing:  Category I Pain Control:  IV meds or epidural PRN I/D:  n/a Anticipated MOD:  NSVD  [name] 08/28/2011, 4:31 AM

## 2011-08-28 NOTE — Progress Notes (Addendum)
DR. Elesa Massed called and notified that pt urinary output is completely red, no clots seen. Pt asystematic. V/s WDL. Orders given to observe B/P and call if any change.  Clarification on IV fluids. Pitocin @ 125cc/hr till 2nd bag infused. Magnesium  25cc/hr and keep NS @ 30cc/hr. Will continue to monitor.

## 2011-08-28 NOTE — Progress Notes (Signed)
Anna Hawkins is a 35 y.o. [id number] at [redacted]w[redacted]d by ultrasound admitted for induction of labor due to Diabetes and Elevated BP concerning for subsequent pre-eclampsia.  Subjective: Complains of pain. Epidural and stadol not providing adequate analgesia. Denies HA, SOB, blurred vision, RUQ pain.   Objective: BP 162/95  Pulse 105  Temp(Src) 97.6 F (36.4 C) (Oral)  Resp 18  Ht 5\' 4"  (1.626 m)  Wt 351 lb (159.213 kg)  BMI 60.25 kg/m2  SpO2 99% I/O last 3 completed shifts: In: 771.4 [P.O.:170; I.V.:601.4] Out: 800 [Urine:400; Emesis/NG output:400] Total I/O In: 2287.1 [I.V.:2287.1] Out: 255 [Urine:255]  FHT:  FHR: 125 bpm, variability: moderate,  accelerations:  Abscent,  decelerations:  Present occassional early and variables. UC:   regular, every 2-3 minutes, adequate SVE:   Dilation: Lip/rim Effacement (%): 100 Station: -1 Exam by:: Ledora Delker MD Position: feels OP Labs: Lab Results  Component Value Date   WBC 15.1* 08/28/2011   HGB 10.0* 08/28/2011   HCT 31.2* 08/28/2011   MCV 72.9* 08/28/2011   PLT 284 08/28/2011   CMP     Component Value Date/Time   NA 136 08/27/2011 1233   K 3.4* 08/27/2011 1233   CL 106 08/27/2011 1233   CO2 20 08/27/2011 1233   GLUCOSE 67* 08/27/2011 1233   BUN 11 08/27/2011 1233   CREATININE <0.47* 08/27/2011 1233   CREATININE 0.71 08/19/2011 0612   CREATININE 0.55 07/30/2011 1423   CALCIUM 9.1 08/27/2011 1233   PROT 6.5 08/27/2011 1233   ALBUMIN 2.3* 08/27/2011 1233   AST 14 08/27/2011 1233   ALT 9 08/27/2011 1233   ALKPHOS 210* 08/27/2011 1233   BILITOT 0.3 08/27/2011 1233   GFRNONAA NOT CALCULATED 08/27/2011 1233   GFRAA NOT CALCULATED 08/27/2011 1233     Assessment / Plan: Induction of labor due to preeclampsia and diabetes,  progressing well on pitocin Have discussed with Dr. Natale Milch.   Labor: Progressing on pitocin.  Will allow patient to continue to labor, then labor down.  Preeclampsia:  on magnesium sulfate, no signs or symptoms of toxicity and net postive 2L. UO 5  cc over last hr. RN to replace foley. Fetal Wellbeing:  Category II Pain Control:  Epidural and stadol. I/D:  n/a Anticipated MOD:  NSVD  Kay Ricciuti 08/28/2011, 1:21 PM

## 2011-08-28 NOTE — Anesthesia Preprocedure Evaluation (Signed)
Anesthesia Evaluation  Name, MR# and DOB Patient awake  General Assessment Comment  Reviewed: Allergy & Precautions, H&P , Patient's Chart, lab work & pertinent test results  Airway Mallampati: III TM Distance: >3 FB Neck ROM: full    Dental No notable dental hx.    Pulmonary  asthma  clear to auscultation+ stridor  pulmonary exam normalPulmonary Exam Normal breath sounds clear to auscultation none stridor    Cardiovascular     Neuro/Psych Negative Neurological ROS  Negative Psych ROS  GI/Hepatic/Renal negative GI ROS  negative Liver ROS  negative Renal ROS        Endo/Other  (+) Diabetes mellitus-,Hypothyroidism,   Morbid obesity  Abdominal (+) obese,   Musculoskeletal negative musculoskeletal ROS (+)   Hematology negative hematology ROS (+)   Peds  Reproductive/Obstetrics (+) Pregnancy    Anesthesia Other Findings             Anesthesia Physical Anesthesia Plan  ASA: III  Anesthesia Plan: Epidural   Post-op Pain Management:    Induction:   Airway Management Planned:   Additional Equipment:   Intra-op Plan:   Post-operative Plan:   Informed Consent: I have reviewed the patients History and Physical, chart, labs and discussed the procedure including the risks, benefits and alternatives for the proposed anesthesia with the patient or authorized representative who has indicated his/her understanding and acceptance.     Plan Discussed with:   Anesthesia Plan Comments:         Anesthesia Quick Evaluation

## 2011-08-28 NOTE — Progress Notes (Signed)
Anna Hawkins is a 35 y.o. [id number] at 101w5d by ultrasound admitted for induction of labor due to preeclampsia.  Subjective: Pt sleeping.  Objective: BP 154/79  Pulse 96  Temp(Src) 98 F (36.7 C) (Oral)  Resp 18  Ht 5\' 4"  (1.626 m)  Wt 351 lb (159.213 kg)  BMI 60.25 kg/m2  SpO2 99% I/O last 3 completed shifts: In: 771.4 [P.O.:170; I.V.:601.4] Out: 800 [Urine:400; Emesis/NG output:400]    FHT:  FHR: 130 bpm, variability: minimal ,  accelerations:  Present,  decelerations:  Absent UC:   Spacing out every 2-6 minutes; MVUs 100 SVE:   Dilation: 6 Effacement (%): 100 Station: -1 Exam by:: [employer]: Lab Results  Component Value Date   WBC 15.1* 08/28/2011   HGB 10.0* 08/28/2011   HCT 31.2* 08/28/2011   MCV 72.9* 08/28/2011   PLT 284 08/28/2011    Assessment / Plan: Induction of labor due to preeclampsia,  Contractions no longer adequate  Labor: contractions spacing out, no longer adequate; will start pitocin Preeclampsia:  on magnesium sulfate and intake and ouput balanced, no signs of toxicity per nursing Fetal Wellbeing:  Category II Pain Control:  Epidural and nubain as having pain with epidural in place; anesthesia to adjust I/D:  n/a Anticipated MOD:  NSVD  BOOTH, Erline Siddoway 08/28/2011, 9:41 AM

## 2011-08-28 NOTE — Progress Notes (Signed)
LYLITH BEBEAU is a 35 y.o. [id number] at [redacted]w[redacted]d admitted for induction of labor due to Pre-eclamptic toxemia of pregnancy..  Subjective:  Epidural in place, but still c/o pain   Objective: BP 110/90  Pulse 109  Temp(Src) 97.7 F (36.5 C) (Oral)  Resp 20  Ht 5\' 4"  (1.626 m)  Wt 159.213 kg (351 lb)  BMI 60.25 kg/m2  SpO2 99% I/O last 3 completed shifts: In: 504.3 [P.O.:30; I.V.:474.3] Out: 75 [Urine:75] Total I/O In: 267.1 [P.O.:140; I.V.:127.1] Out: 725 [Urine:325; Emesis/NG output:400]  FHT:  FHR: 130 bpm, variability: min - mod,  accelerations:  Present,  decelerations:  Absent UC:   Difficult to trace, regular q2-3 minutes SVE:   Dilation: 4 Effacement (%): 90 Station: -2 Exam by:: N.Fazier,CNM  Labs: Lab Results  Component Value Date   WBC 15.1* 08/28/2011   HGB 10.0* 08/28/2011   HCT 31.2* 08/28/2011   MCV 72.9* 08/28/2011   PLT 284 08/28/2011    Assessment / Plan: Induction of labor due to preeclampsia,  progressing well on pitocin  Labor: progressing, early labor Preeclampsia:  on magnesium sulfate and no signs or symptoms of toxicity Fetal Wellbeing:  Category I Pain Control:  Epidural I/D:  n/a Anticipated MOD:  NSVD  Tanija Germani 08/28/2011, 6:45 AM

## 2011-08-28 NOTE — Progress Notes (Signed)
Delivery of Viable female by Dr Shawnie Pons. Assisted by Dr Natale Milch, Dr Armen Pickup. N. Dixon RN [facility] at bedside. Code APGAR called.

## 2011-08-28 NOTE — Consult Note (Signed)
Neonatology Note:  Attendance at Code Apgar:  Our team responded to a Code Apgar call to room # 164 following VD, due to infant with apnea following vaginal delivery complicated by shoulder dystocia. The mother is a G7P2A4 O pos, GBS neg with GDM, gestational HTN, morbid obesity, hypothyroidism and history of shoulder dystocia. She was on labetalol and magnesium sulfate. At delivery, the baby floppy with a HR of 80 and no spont resp. The OB nursing staff in attendance gave stimulation and PPV for about 30 seconds. Our team arrived at 1 1/4 minutes of life, at which time the baby was getting PPV; she appeared dusky but had some tone and was just starting to cry. We bulb suctioned and gave gentle stimulation to encourage continued crying. Lungs clear to ausc in DR. Moving all extremities well, no focal neurologic deficits noted. OB stated that a cracking sound was noted at delivery of left arm, but I could not palpate any fractures. I spoke with the parents in the DR, then transferred the baby to the Pediatrician's care. C. Raychelle Hudman, MD      

## 2011-08-28 NOTE — Progress Notes (Signed)
I was present at the time of the delivery. I delivered the posterior arm and felt the "snap". This was communicated to NICU provider and the bones were palpated w/out deficit found. Infant was moving both extremities. The cord began to separate from the placenta and I manually removed it. Uterus was boggy and difficult to palpate/do bimanual massage so cytotec placed PR prophylactically. Agree with above documentation.

## 2011-08-28 NOTE — Progress Notes (Signed)
ALISSE TUITE is a 35 y.o. [id number] at [redacted]w[redacted]d by ultrasound admitted for induction of labor due to Diabetes and Elevated BP concerning for subsequent pre-eclampsia.  Subjective: Complains of pain. Epidural and stadol not providing adequate analgesia. Denies HA, SOB, blurred vision, RUQ pain.   Objective: BP 156/85  Pulse 103  Temp(Src) 97.6 F (36.4 C) (Oral)  Resp 18  Ht 5\' 4"  (1.626 m)  Wt 351 lb (159.213 kg)  BMI 60.25 kg/m2  SpO2 99% I/O last 3 completed shifts: In: 771.4 [P.O.:170; I.V.:601.4] Out: 800 [Urine:400; Emesis/NG output:400] Total I/O In: 2164.1 [I.V.:2164.1] Out: 250 [Urine:250]  FHT:  FHR: 125 bpm, variability: minimal ,  accelerations:  Abscent,  decelerations:  Present occassional early.  UC:   irregular, every 2-5 minutes SVE:   Dilation: 7.5 Effacement (%): 100 Station: -1 Exam by:: Felipa Furnace RN DTRs: still difficult to assess. Bilateral.   Labs: Lab Results  Component Value Date   WBC 15.1* 08/28/2011   HGB 10.0* 08/28/2011   HCT 31.2* 08/28/2011   MCV 72.9* 08/28/2011   PLT 284 08/28/2011    Assessment / Plan: Induction of labor due to preeclampsia and diabetes,  progressing well on pitocin  Labor: Progressing on pitocin.  Preeclampsia:  on magnesium sulfate, no signs or symptoms of toxicity and net postive 2L. Adequate UO over 8 last 8 hr shift.  Fetal Wellbeing:  Category II Pain Control:  Epidural and stadol. I/D:  n/a Anticipated MOD:  NSVD  Avereigh Spainhower 08/28/2011, 11:54 AM

## 2011-08-28 NOTE — Anesthesia Procedure Notes (Addendum)
Epidural Patient location during procedure: OB Start time: 08/28/2011 5:20 AM End time: 08/28/2011 5:40 AM Reason for block: procedure for pain  Staffing Anesthesiologist: Sandrea Hughs Performed by: anesthesiologist   Preanesthetic Checklist Completed: patient identified, site marked, surgical consent, pre-op evaluation, timeout performed, IV checked, risks and benefits discussed and monitors and equipment checked  Epidural Patient position: sitting Prep: site prepped and draped and DuraPrep Patient monitoring: continuous pulse ox and blood pressure Approach: midline Injection technique: LOR air  Needle:  Needle type: Tuohy  Needle gauge: 17 G Needle length: 9 cm Needle insertion depth: 9 cm Catheter type: closed end flexible Catheter size: 19 Gauge Catheter at skin depth: 15 cm Test dose: negative and 1.5% lidocaine  Assessment Events: blood not aspirated, injection not painful, no injection resistance, negative IV test and no paresthesia  Additional Notes 0750 - came to evaluate patient for nonfunctioning epidural.  Epidural removed and replaced.  LOR to air at 9 cm at L2-3, cath secured at 15 cm at skin.  Test dose negative.  Bolused and pump restarted at 0809.  Jasmine December, MD

## 2011-08-29 LAB — GLUCOSE, CAPILLARY
Glucose-Capillary: 124 mg/dL — ABNORMAL HIGH (ref 70–99)
Glucose-Capillary: 100 mg/dL — ABNORMAL HIGH (ref 70–99)

## 2011-08-29 LAB — CBC
Hemoglobin: 8.3 g/dL — ABNORMAL LOW (ref 12.0–15.0)
MCH: 23.4 pg — ABNORMAL LOW (ref 26.0–34.0)
MCHC: 32 g/dL (ref 30.0–36.0)
MCV: 73.2 fL — ABNORMAL LOW (ref 78.0–100.0)
Platelets: 256 10*3/uL (ref 150–400)

## 2011-08-29 LAB — MAGNESIUM: Magnesium: 4.4 mg/dL — ABNORMAL HIGH (ref 1.5–2.5)

## 2011-08-29 MED ORDER — SODIUM CHLORIDE 0.9 % IV SOLN: INTRAVENOUS | Status: DC

## 2011-08-29 MED ORDER — LACTATED RINGERS IV SOLN
INTRAVENOUS | Status: DC
  Administered 2011-08-29: 04:00:00 via INTRAVENOUS

## 2011-08-29 MED ORDER — GLYBURIDE 2.5 MG PO TABS
2.5000 mg | ORAL_TABLET | Freq: Every day | ORAL | Status: DC
  Administered 2011-08-29 – 2011-08-30 (×2): 2.5 mg via ORAL
  Filled 2011-08-29 (×3): qty 1

## 2011-08-29 NOTE — Progress Notes (Signed)
Post Partum Day 1 Subjective: no complaints, up ad lib, voiding, tolerating PO and + flatus  Objective: Blood pressure 126/61, pulse 84, temperature 98.2 F (36.8 C), temperature source Oral, resp. rate 20, height 5\' 2"  (1.575 m), weight 157.353 kg (346 lb 14.4 oz), SpO2 99.00%. Range BP's last 8 hours: 126-137/50-79 Fasting CBG this AM 124  Physical Exam:  General: alert and no distress Heart: RRR, no murmur Lungs: CTA B/L Lochia: appropriate, reported as less than a period Uterine Fundus: unable to palpate due to body habitus DVT Evaluation: No evidence of DVT seen on physical exam.   Basename 08/29/11 0625 08/28/11 1738  HGB 8.3* 9.9*  HCT 25.9* 30.7*    Assessment/Plan: Doing well Postpartum Discussed again that BTIL will be cancelled and we will assist in another procedure, if possible, the Essure, and determine if there is any financial assistance Bottle Feeding Will start Glyburide 2.5 daily for DM Cont methyldopa and HCTZ for blood pressures Anticipate transfer to regular postpartum floor later today after 24 hours of MgSO4 complete   LOS: 2 days   Tycho Cheramie N 08/29/2011, 7:41 AM

## 2011-08-29 NOTE — Progress Notes (Signed)
1745 Verbal SBAR report given to Thayer Ohm, Charity fundraiser on [place].

## 2011-08-29 NOTE — Anesthesia Postprocedure Evaluation (Signed)
  Anesthesia Post-op Note  Patient: Anna Hawkins  Procedure(s) Performed: * No procedures listed *  Patient Location: PACU and A-ICU  Anesthesia Type: Epidural  Level of Consciousness: awake, alert  and oriented  Airway and Oxygen Therapy: Patient Spontanous Breathing  Post-op Assessment: Post-op Vital signs reviewed and Patient's Cardiovascular Status Stable  Post-op Vital Signs: Reviewed and stable  Complications: No apparent anesthesia complications

## 2011-08-30 MED ORDER — ALBUTEROL SULFATE HFA 108 (90 BASE) MCG/ACT IN AERS: 2.0000 | INHALATION_SPRAY | Freq: Four times a day (QID) | RESPIRATORY_TRACT | Status: DC | PRN

## 2011-08-30 MED ORDER — HYDROCHLOROTHIAZIDE 25 MG PO TABS: 25.0000 mg | ORAL_TABLET | Freq: Every day | ORAL | Status: DC

## 2011-08-30 MED ORDER — IBUPROFEN 600 MG PO TABS: 600.0000 mg | ORAL_TABLET | Freq: Three times a day (TID) | ORAL | Status: AC | PRN

## 2011-08-30 MED ORDER — METHYLDOPA 500 MG PO TABS: 500.0000 mg | ORAL_TABLET | Freq: Three times a day (TID) | ORAL | Status: DC

## 2011-08-30 MED ORDER — INTEGRA F 125-1 MG PO CAPS: 1.0000 | ORAL_CAPSULE | Freq: Every day | ORAL | Status: DC

## 2011-08-30 NOTE — Discharge Summary (Signed)
Obstetric Discharge Summary Reason for Admission: induction of labor and for preeclampsia. Prenatal Procedures: Preeclampsia and ultrasound; Insulin management for gestational diabetes.  Intrapartum Procedures: spontaneous vaginal delivery; Mag Sulfate Postpartum Procedures: Mag Sulfate x 24 hrs Complications-Operative and Postpartum: none Hemoglobin  Date Value Range Status  08/29/2011 8.3* 12.0-15.0 (g/dL) Final     HCT  Date Value Range Status  08/29/2011 25.9* 36.0-46.0 (%) Final    Discharge Diagnoses: Preelampsia and Gestational Diabetes, Anemia, Chronic Hypertension  Discharge Information: Date: 08/30/2011 Activity: pelvic rest Diet: routine Medications: Ibuprophen and Methyldopa, HCTZ, Integra Condition: stable Instructions: refer to practice specific booklet and report any increased bleeding or signs of preeclampsia.   Discharge to: home Follow-up Information    Follow up with [facility]. Make an appointment in 2 weeks. (Call to make an appointment to be seen in two weeks to review blood pressure and discuss family planning option.)    Contact information:   [address] [place] [address] [phone]         Newborn Data: Live born female  Birth Weight: 7 lb 1.9 oz (3229 g) APGAR: 1, 9  Home with mother.  [facility] 08/30/2011, 7:29 AM

## 2011-08-30 NOTE — Progress Notes (Signed)
Post Partum Day 2  Subjective: no complaints, up ad lib, voiding, tolerating PO and pt denies headaches, vision changes, or epigastric pain.  No calf pain.  Pt would like information on IUD.  Desires discharge home today.  Decreased bleeding and pain controlled with ibuprofen.  Objective: Blood pressure 131/58, pulse 87, temperature 98.8 F (37.1 C), temperature source Oral, resp. rate 20, height 5\' 2"  (1.575 m), weight 157.353 kg (346 lb 14.4 oz), SpO2 99.00%.  Physical Exam:  General: alert, cooperative, appears stated age and no distress CVS:  RRR, without murmur, gallop, or rub. Lungs:  CTAB Lochia: appropriate Uterine Fundus: firm, 2 below umbilicus Incision: n/a DVT Evaluation: No evidence of DVT seen on physical exam.  Extremities - trace edema.   Basename 08/29/11 0625 08/28/11 1738  HGB 8.3* 9.9*  HCT 25.9* 30.7*   CBG (last 3)   Basename 08/30/11 0649 08/29/11 2253 08/29/11 1634  GLUCAP 96 100* 90    Assessment/Plan: Chronic Hypertension Gestational Diabetes Anemia Normal Postpartum Exam  Discharge home in late afternoon RX  Methyldopa 500mg  TID HCTZ 25 mg QD Ibuprofen 600mg  q 8 hours for pain Integra one pill po qd F/U in [facility] in 2 weeks   LOS: 3 days   [facility] 08/30/2011, 7:16 AM

## 2011-08-31 ENCOUNTER — Encounter (HOSPITAL_COMMUNITY): Payer: Medicaid Other

## 2011-08-31 NOTE — Progress Notes (Signed)
UR chart review completed.  

## 2011-09-01 ENCOUNTER — Telehealth: Payer: Self-pay | Admitting: *Deleted

## 2011-09-01 NOTE — Telephone Encounter (Signed)
Pt had called wanting to know about tubal. Telephoned pt. Advised pt to keep appt on 1017-12 at 12:45 that would for surgical consult and postpartum visit per [facility].

## 2011-09-05 NOTE — Progress Notes (Signed)
Encounter addended by: Cam Hai, CNM on: 09/05/2011 10:20 PM<BR>     Documentation filed: Charges VN

## 2011-09-07 ENCOUNTER — Ambulatory Visit (INDEPENDENT_AMBULATORY_CARE_PROVIDER_SITE_OTHER): Payer: Medicaid Other | Admitting: Obstetrics and Gynecology

## 2011-09-07 VITALS — BP 135/86 | HR 101

## 2011-09-07 DIAGNOSIS — O165 Unspecified maternal hypertension, complicating the puerperium: Secondary | ICD-10-CM

## 2011-09-10 LAB — POCT URINALYSIS DIP (DEVICE)
Ketones, ur: NEGATIVE
Operator id: 239701
Protein, ur: 100 — AB
Specific Gravity, Urine: >=1.03

## 2011-09-10 LAB — COMPREHENSIVE METABOLIC PANEL
ALT: 19
AST: 15
CO2: 27
Calcium: 10.4
Creatinine, Ser: 1.61 — ABNORMAL HIGH
GFR calc Af Amer: 45 — ABNORMAL LOW
GFR calc non Af Amer: 37 — ABNORMAL LOW
Sodium: 139
Total Protein: 8.5 — ABNORMAL HIGH

## 2011-09-10 LAB — DIFFERENTIAL
Eosinophils Absolute: 0.1
Eosinophils Relative: 1
Lymphocytes Relative: 12
Lymphs Abs: 1.8
Monocytes Relative: 5

## 2011-09-10 LAB — URINALYSIS, ROUTINE W REFLEX MICROSCOPIC
Glucose, UA: 250 — AB
Leukocytes, UA: NEGATIVE
Nitrite: NEGATIVE
Specific Gravity, Urine: 1.029
pH: 5

## 2011-09-10 LAB — POCT PREGNANCY, URINE: Preg Test, Ur: NEGATIVE

## 2011-09-10 LAB — CBC
MCHC: 33.3
MCV: 72.9 — ABNORMAL LOW
Platelets: 359
RDW: 15.2

## 2011-09-10 LAB — URINE MICROSCOPIC-ADD ON

## 2011-09-11 LAB — COMPREHENSIVE METABOLIC PANEL
ALT: 23
AST: 21
Albumin: 4.2
Alkaline Phosphatase: 93
Chloride: 98
GFR calc Af Amer: >60
Potassium: 3.1 — ABNORMAL LOW
Sodium: 141
Total Protein: 8.3

## 2011-09-11 LAB — URINALYSIS, ROUTINE W REFLEX MICROSCOPIC
Glucose, UA: 500 — AB
Ketones, ur: >80 — AB
pH: 5.5

## 2011-09-11 LAB — URINE MICROSCOPIC-ADD ON

## 2011-09-11 LAB — DIFFERENTIAL
Basophils Relative: 0
Eosinophils Absolute: 0.2
Eosinophils Relative: 2
Monocytes Absolute: 0.8
Monocytes Relative: 6
Neutro Abs: 10.3 — ABNORMAL HIGH

## 2011-09-11 LAB — CBC
Platelets: 323
RDW: 14.6
WBC: 13.3 — ABNORMAL HIGH

## 2011-09-14 ENCOUNTER — Encounter (HOSPITAL_COMMUNITY): Payer: Self-pay | Admitting: *Deleted

## 2011-09-14 ENCOUNTER — Inpatient Hospital Stay (HOSPITAL_COMMUNITY)
Admission: AD | Admit: 2011-09-14 | Discharge: 2011-09-14 | Discharge status: Against Medical Advice | Payer: Medicaid Other | Source: Ambulatory Visit | Attending: Obstetrics & Gynecology | Admitting: Obstetrics & Gynecology

## 2011-09-14 DIAGNOSIS — O864 Pyrexia of unknown origin following delivery: Secondary | ICD-10-CM | POA: Insufficient documentation

## 2011-09-14 LAB — URINE MICROSCOPIC-ADD ON

## 2011-09-14 LAB — URINALYSIS, ROUTINE W REFLEX MICROSCOPIC
Bilirubin Urine: NEGATIVE
Glucose, UA: NEGATIVE mg/dL
Protein, ur: 100 mg/dL — AB

## 2011-09-14 LAB — CBC
HCT: 35.3 % — ABNORMAL LOW (ref 36.0–46.0)
MCV: 73.8 fL — ABNORMAL LOW (ref 78.0–100.0)
RDW: 14.4 % (ref 11.5–15.5)
WBC: 5 10*3/uL (ref 4.0–10.5)

## 2011-09-14 NOTE — Progress Notes (Signed)
Pt states," I delivered ( vag) on Sept 7th. I started having chills yesterday morning, and last night I had a fever last night of 100.1. Today it has been 102.3. I have a dull headache but no other pain."

## 2011-09-14 NOTE — Progress Notes (Signed)
Pt left AMA at 2155.  C. Harris,RN attempt to reach pt by phone at [phone] in order to obtain allergies and pharmacy info. No answer. Left message for pt to call [facility] per Harris,RN. U/a resutls reviewed by Strickland,MD , pt needs antibiotics.

## 2011-09-17 NOTE — Progress Notes (Signed)
I have reviewed the category I tracing of 8/30

## 2011-09-21 ENCOUNTER — Telehealth: Payer: Self-pay | Admitting: *Deleted

## 2011-09-21 NOTE — Telephone Encounter (Signed)
Pt states that she needs to get documentation about her medical care that she has received here.

## 2011-09-21 NOTE — Telephone Encounter (Signed)
Called pt- she states that she needs a letter stating when she began her prenatal care in our clinic and that she is still under our medical care.  (currently she is post-partum and has next appt for PP check on 10/07/11)  Pt would like to be called when the letter is ready and she will come to pick it up. I states that we will have letter prepared this week.

## 2011-09-23 ENCOUNTER — Encounter: Payer: Self-pay | Admitting: Obstetrics and Gynecology

## 2011-09-23 NOTE — Telephone Encounter (Signed)
Patient has been informed that she can pick up letter at any time.

## 2011-10-07 ENCOUNTER — Ambulatory Visit (INDEPENDENT_AMBULATORY_CARE_PROVIDER_SITE_OTHER): Payer: Medicaid Other | Admitting: Obstetrics and Gynecology

## 2011-10-07 ENCOUNTER — Ambulatory Visit: Payer: Medicaid Other | Admitting: Obstetrics and Gynecology

## 2011-10-07 ENCOUNTER — Encounter: Payer: Self-pay | Admitting: Obstetrics and Gynecology

## 2011-10-07 DIAGNOSIS — Z3009 Encounter for other general counseling and advice on contraception: Secondary | ICD-10-CM

## 2011-10-07 DIAGNOSIS — O10019 Pre-existing essential hypertension complicating pregnancy, unspecified trimester: Secondary | ICD-10-CM

## 2011-10-07 DIAGNOSIS — IMO0001 Reserved for inherently not codable concepts without codable children: Secondary | ICD-10-CM

## 2011-10-07 DIAGNOSIS — E119 Type 2 diabetes mellitus without complications: Secondary | ICD-10-CM

## 2011-10-07 MED ORDER — INFLUENZA VIRUS VACC SPLIT PF IM SUSP: 0.5000 mL | Freq: Once | INTRAMUSCULAR | Status: DC

## 2011-10-07 NOTE — Progress Notes (Addendum)
  Subjective:    Patient ID: Anna Hawkins, female    DOB: [birth year], 35 y.o.   MRN: [id number]  HPI 35 yo [id number] s/p SVD on 08/26/2011 presenting today for postpartum check. Patient with prenatal care complicated by DM and CHTN with superimposed pre-eclampsia. Patient currently on HCTZ and methyldopa for BP control. Patient reports not prescribed any medications for DM, and DM was diet controlled pre-pregnancy. Patient reports doing well and having a lot of support with care of infant. Patient denies si/sx of postpartum depression. Patient is bottle feeding and infant is thriving. Patient has had her first menses on 10/12 and has not been sexually active. Patient desires bilateral tubal ligation for birth control.  Past Medical History  Diagnosis Date  . Asthma   . Chlamydia   . Obesity   . Gestational diabetes   . Pregnancy induced hypertension   . Hypothyroidism   . Fatty liver   . Hx of shoulder dystocia, prior pregnancy, currently pregnant    Past Surgical History  Procedure Date  . Therapeutic abortion   . Wisdom tooth extraction    History  Substance Use Topics  . Smoking status: Never Smoker   . Smokeless tobacco: Never Used  . Alcohol Use: No   Family History  Problem Relation Age of Onset  . Diabetes Mother   . Hypertension Mother   . Diabetes Brother   . Hypertension Brother     Review of Systems  All other systems reviewed and are negative.       Objective:   Physical Exam  Constitutional: She is oriented to person, place, and time. She appears well-developed and well-nourished.  HENT:  Head: Normocephalic and atraumatic.  Neck: Normal range of motion. Neck supple.  Cardiovascular: Normal rate and regular rhythm.   Pulmonary/Chest: Effort normal and breath sounds normal.  Abdominal: Soft. Bowel sounds are normal.  Genitourinary: Vagina normal and uterus normal.       Limited secondary to body habitus  Neurological: She is alert and oriented to  person, place, and time.     Assessment & Plan:  35 yo [id number] here for postpartum check and schedule tubal ligation - patient medically cleared to resume all activities of daily living - patient to be referred to family practise for management of HTN and DM - patient will be scheduled for laparoscopic tubal ligation. Risk, benefits and alternatives explained including but not limited to risk of bleeding, infection, and damage to adjacent organs. Patient to use condoms until tubal ligation.

## 2011-10-08 ENCOUNTER — Encounter (HOSPITAL_COMMUNITY)
Admission: RE | Admit: 2011-10-08 | Discharge: 2011-10-08 | Disposition: A | Discharge status: Home / Self Care | Payer: Medicaid Other | Source: Ambulatory Visit | Attending: Obstetrics and Gynecology | Admitting: Obstetrics and Gynecology

## 2011-10-08 ENCOUNTER — Encounter (HOSPITAL_COMMUNITY): Payer: Self-pay

## 2011-10-08 DIAGNOSIS — Z01818 Encounter for other preprocedural examination: Secondary | ICD-10-CM | POA: Insufficient documentation

## 2011-10-08 DIAGNOSIS — Z01812 Encounter for preprocedural laboratory examination: Secondary | ICD-10-CM | POA: Insufficient documentation

## 2011-10-08 LAB — BASIC METABOLIC PANEL
Calcium: 9.7 mg/dL (ref 8.4–10.5)
Chloride: 106 mEq/L (ref 96–112)
Creatinine, Ser: 0.69 mg/dL (ref 0.50–1.10)
GFR calc Af Amer: >90 mL/min (ref >90)
GFR calc non Af Amer: >90 mL/min (ref >90)

## 2011-10-08 LAB — CBC
Hemoglobin: 9.9 g/dL — ABNORMAL LOW (ref 12.0–15.0)
MCHC: 31 g/dL (ref 30.0–36.0)
Platelets: 284 10*3/uL (ref 150–400)

## 2011-10-08 LAB — SURGICAL PCR SCREEN
MRSA, PCR: POSITIVE — AB
Staphylococcus aureus: POSITIVE — AB

## 2011-10-08 NOTE — Pre-Procedure Instructions (Signed)
Reviewed with Dr Carmon Ginsberg Jackson-instructed to call t/s to make appointment to manage b/p and asthma. T/S office called-spoke with Marnie-will make appointment with Family Practice for bp and asthma control-prior to surgical date of 10/23-will call pt with appointment time-pt aware of plans.

## 2011-10-08 NOTE — Patient Instructions (Signed)
   Your procedure is scheduled on: Tuesday October 23rd  Enter through the Main Entrance of [facility] at:10:45am Pick up the phone at the desk and dial [phone] and inform us of your arrival.  Please call this number if you have any problems the morning of surgery: [phone]  Remember: Do not eat food after midnight: Do not drink clear liquids after:8am Take these medicines the morning of surgery with a SIP OF WATER: b/p medicines, bring your inhaler with you day of surgery  Do not wear jewelry, make-up, or FINGER nail polish Do not wear lotions, powders, or perfumes.  You may wear deodorant. Do not shave 48 hours prior to surgery. Do not bring valuables to the hospital.  Patients discharged on the day of surgery will not be allowed to drive home.    Remember to use your hibiclens as instructed.Please shower with 1/2 bottle the evening before your surgery and the other 1/2 bottle the morning of surgery.

## 2011-10-19 ENCOUNTER — Telehealth: Payer: Self-pay | Admitting: *Deleted

## 2011-10-19 ENCOUNTER — Other Ambulatory Visit: Payer: Self-pay

## 2011-10-19 ENCOUNTER — Encounter: Payer: Self-pay | Admitting: Family Medicine

## 2011-10-19 ENCOUNTER — Ambulatory Visit (INDEPENDENT_AMBULATORY_CARE_PROVIDER_SITE_OTHER): Payer: Self-pay | Admitting: Family Medicine

## 2011-10-19 ENCOUNTER — Ambulatory Visit (HOSPITAL_COMMUNITY)
Admission: RE | Admit: 2011-10-19 | Discharge: 2011-10-19 | Disposition: A | Discharge status: Home / Self Care | Payer: Medicaid Other | Source: Ambulatory Visit | Attending: Family Medicine | Admitting: Family Medicine

## 2011-10-19 DIAGNOSIS — Z01810 Encounter for preprocedural cardiovascular examination: Secondary | ICD-10-CM | POA: Insufficient documentation

## 2011-10-19 DIAGNOSIS — O24919 Unspecified diabetes mellitus in pregnancy, unspecified trimester: Secondary | ICD-10-CM

## 2011-10-19 DIAGNOSIS — Z23 Encounter for immunization: Secondary | ICD-10-CM

## 2011-10-19 DIAGNOSIS — O165 Unspecified maternal hypertension, complicating the puerperium: Secondary | ICD-10-CM

## 2011-10-19 DIAGNOSIS — E669 Obesity, unspecified: Secondary | ICD-10-CM

## 2011-10-19 LAB — BASIC METABOLIC PANEL
Calcium: 9.8 mg/dL (ref 8.4–10.5)
Sodium: 139 mEq/L (ref 135–145)

## 2011-10-19 LAB — POCT GLYCOSYLATED HEMOGLOBIN (HGB A1C): Hemoglobin A1C: 6.6

## 2011-10-19 MED ORDER — LOSARTAN POTASSIUM-HCTZ 100-25 MG PO TABS: 1.0000 | ORAL_TABLET | Freq: Every day | ORAL | Status: DC

## 2011-10-19 NOTE — Telephone Encounter (Signed)
PA required for Losartan/HCTZ.   form placed in MD box

## 2011-10-19 NOTE — Assessment & Plan Note (Signed)
Pt is only 2 months post partum, but has been obese chronically.  This is likely complicating blood pressure and DM.  Pt advised of this, advised of diet and exercise.

## 2011-10-19 NOTE — Assessment & Plan Note (Signed)
Will discontinue methyldopa, and start a combo pill of HCTZ/Losartan as pt with DM and has cough with lisinopril.  Pt is to come in this Friday or Next Monday for a blood pressure check before her tubal on 10/29/11.

## 2011-10-19 NOTE — Assessment & Plan Note (Signed)
Pt now reporting good blood sugar control without medications.  Will Check a1c as normoglycemia important for decreasing risk of post-op complications.

## 2011-10-19 NOTE — Patient Instructions (Signed)
It was nice to see you.  Please make an nurse visit on Friday or Monday to have your blood pressure re-checked.    I will send you a letter with your labs.    Please follow up in 3-6 months for your blood pressure and blood sugar.

## 2011-10-19 NOTE — Progress Notes (Signed)
  Subjective:    Patient ID: Anna Hawkins, female    DOB: 1976-12-14, 35 y.o.   MRN: [id number]  HPI  Patient comes in today to establish care and for surgical clearance. She is about 2 months postpartum, and wants to have her tube Tubes tied. She says that she has a history of high blood pressure, and took hydrochlorothiazide and methyldopa during her pregnancy. She says that it was elevated at her postpartum visit at the [facility] clinic, so they referred her to the family medicine office for chronic hypertension management.  Anna Hawkins has also had diabetes in the past, and was on insulin during her recent pregnancy. She says that since she delivered, she has had diet-controlled diabetes.  Surgical clearance: The patient denies any chest pain, palpitations, dyspnea. She has never had any heart disease in the past. She has never had a bad reaction to anesthesia in the past. She says that she can walk 4 city blocks without feeling short of breath. The patient states that both of her parents have diabetes, and her mother has high blood pressure, but no one in the family has heart disease, has had a heart attack, or has heart failure.  Review of Systems Per HPI.     Objective:   Physical Exam BP 148/85  Pulse 105  Temp(Src) 98.2 F (36.8 C) (Oral)  Ht 5' 5.25" (1.657 m)  Wt 302 lb (136.986 kg)  BMI 49.87 kg/m2  LMP 10/02/2011 General appearance: alert, cooperative and no distress Eyes: conjunctivae/corneas clear. PERRL, EOM's intact. Fundi benign. Throat: lips, mucosa, and tongue normal; teeth and gums normal Neck: no adenopathy, no carotid bruit, no JVD, supple, symmetrical, trachea midline and thyroid not enlarged, symmetric, no tenderness/mass/nodules Lungs: clear to auscultation bilaterally Heart: regular rate and rhythm, S1, S2 normal, no murmur, click, rub or gallop Abdomen: soft, non-tender; bowel sounds normal; no masses,  no organomegaly Extremities: extremities normal,  atraumatic, no cyanosis or edema Pulses: 2+ and symmetric  EKG: Normal Sinus Rhythm, Rate 95 bpm, Normal axis, No T-wave abnormalities.      Assessment & Plan:

## 2011-10-20 ENCOUNTER — Telehealth: Payer: Self-pay | Admitting: Family Medicine

## 2011-10-20 NOTE — Telephone Encounter (Signed)
Pt called back to say the pharm did receive it, but needs prior approval in order to fill it- needs this for surgical clearance.

## 2011-10-20 NOTE — Telephone Encounter (Signed)
Form faxed to medicaid.

## 2011-10-20 NOTE — Telephone Encounter (Signed)
Form should be in your box.

## 2011-10-20 NOTE — Telephone Encounter (Signed)
[facility] states that they have not rec'd refill on her BP meds.  pls call them to verify.

## 2011-10-20 NOTE — Telephone Encounter (Signed)
Approval received from [facility] for Losartan /HCTZ. Pharmacy notified.

## 2011-10-20 NOTE — Telephone Encounter (Signed)
Approval received from medicaid. Pharmacy notified. 

## 2011-10-22 NOTE — Progress Notes (Signed)
NST reactive from 08-13-11   

## 2011-10-26 ENCOUNTER — Ambulatory Visit (INDEPENDENT_AMBULATORY_CARE_PROVIDER_SITE_OTHER): Payer: Medicaid Other | Admitting: *Deleted

## 2011-10-26 DIAGNOSIS — O165 Unspecified maternal hypertension, complicating the puerperium: Secondary | ICD-10-CM

## 2011-10-26 NOTE — Progress Notes (Signed)
Patient in for BP check today.  Stared Lorastan /HCTZ on 11/01.   BP checked with large adult cuff manually   LA 140/90 ,  RA 150/96.  Then  checked with thigh cuff LA 130/84. Unable to hear well in RA with thigh cuff.  Pulse 88. Will forward to Dr. Lula Olszewski . Scheduled for BTL 10/29/2011.

## 2011-10-27 ENCOUNTER — Telehealth: Payer: Self-pay | Admitting: *Deleted

## 2011-10-27 ENCOUNTER — Telehealth: Payer: Self-pay | Admitting: Family Medicine

## 2011-10-27 NOTE — Telephone Encounter (Signed)
Patient notified

## 2011-10-27 NOTE — Telephone Encounter (Signed)
Her BP are acceptable.  Will forward to Dr. Lula Olszewski, but patient to proceed with BTL as scheduled.

## 2011-10-27 NOTE — Telephone Encounter (Signed)
Will forward to PCP 

## 2011-10-27 NOTE — Telephone Encounter (Signed)
Patient  wants to know if MD plans to start her on medication for diabetes. Advised will send message to MD and call her back next week. Advised her to watch  diet  and eliminate  foods she knows are high in sugar and calorie content .

## 2011-10-27 NOTE — Progress Notes (Signed)
Dr. Tye Savoy advised BP acceptable. See next note. Patient notified.

## 2011-10-28 ENCOUNTER — Encounter: Payer: Self-pay | Admitting: Family Medicine

## 2011-10-28 ENCOUNTER — Telehealth: Payer: Self-pay | Admitting: Family Medicine

## 2011-10-28 NOTE — Telephone Encounter (Signed)
Left a message with [name] regarding her diabetes.  Also, sent a letter.  A1c = 6.6, which is high, but not high enough to start medications.  Lifestyle and diet changes needed to prevent her from needing to be on medications.  Advised she could call for an office visit to discuss further if she would like.

## 2011-10-29 ENCOUNTER — Encounter (HOSPITAL_COMMUNITY): Payer: Self-pay | Admitting: Anesthesiology

## 2011-10-29 ENCOUNTER — Encounter (HOSPITAL_COMMUNITY): Payer: Self-pay | Admitting: *Deleted

## 2011-10-29 ENCOUNTER — Ambulatory Visit (HOSPITAL_COMMUNITY): Payer: Medicaid Other | Admitting: Anesthesiology

## 2011-10-29 ENCOUNTER — Encounter (HOSPITAL_COMMUNITY)
Admission: RE | Disposition: A | Discharge status: Home / Self Care | Payer: Self-pay | Source: Ambulatory Visit | Attending: Obstetrics and Gynecology

## 2011-10-29 ENCOUNTER — Ambulatory Visit (HOSPITAL_COMMUNITY)
Admission: RE | Admit: 2011-10-29 | Discharge: 2011-10-29 | Disposition: A | Discharge status: Home / Self Care | Payer: Medicaid Other | Source: Ambulatory Visit | Attending: Obstetrics and Gynecology | Admitting: Obstetrics and Gynecology

## 2011-10-29 DIAGNOSIS — Z01812 Encounter for preprocedural laboratory examination: Secondary | ICD-10-CM | POA: Insufficient documentation

## 2011-10-29 DIAGNOSIS — Z302 Encounter for sterilization: Secondary | ICD-10-CM

## 2011-10-29 DIAGNOSIS — Z01818 Encounter for other preprocedural examination: Secondary | ICD-10-CM | POA: Insufficient documentation

## 2011-10-29 HISTORY — PX: LAPAROSCOPIC TUBAL LIGATION: SHX1937

## 2011-10-29 LAB — GLUCOSE, CAPILLARY
Glucose-Capillary: 192 mg/dL — ABNORMAL HIGH (ref 70–99)
Glucose-Capillary: 160 mg/dL — ABNORMAL HIGH (ref 70–99)

## 2011-10-29 SURGERY — LIGATION, FALLOPIAN TUBE, LAPAROSCOPIC
Anesthesia: General | Site: Abdomen | Laterality: Bilateral | Wound class: Clean Contaminated

## 2011-10-29 MED ORDER — GLYCOPYRROLATE 0.2 MG/ML IJ SOLN
INTRAMUSCULAR | Status: AC
  Filled 2011-10-29: qty 1

## 2011-10-29 MED ORDER — GLYCOPYRROLATE 0.2 MG/ML IJ SOLN
INTRAMUSCULAR | Status: DC | PRN
  Administered 2011-10-29: .8 mg via INTRAVENOUS

## 2011-10-29 MED ORDER — NEOSTIGMINE METHYLSULFATE 1 MG/ML IJ SOLN
INTRAMUSCULAR | Status: DC | PRN
  Administered 2011-10-29: 4 mg via INTRAVENOUS

## 2011-10-29 MED ORDER — LIDOCAINE HCL (CARDIAC) 20 MG/ML IV SOLN
INTRAVENOUS | Status: AC
  Filled 2011-10-29: qty 5

## 2011-10-29 MED ORDER — ONDANSETRON HCL 4 MG/2ML IJ SOLN
INTRAMUSCULAR | Status: DC | PRN
  Administered 2011-10-29: 4 mg via INTRAVENOUS

## 2011-10-29 MED ORDER — LACTATED RINGERS IV SOLN
INTRAVENOUS | Status: DC
  Administered 2011-10-29: 14:00:00 via INTRAVENOUS

## 2011-10-29 MED ORDER — KETOROLAC TROMETHAMINE 30 MG/ML IJ SOLN
INTRAMUSCULAR | Status: AC
  Filled 2011-10-29: qty 1

## 2011-10-29 MED ORDER — BUPIVACAINE HCL (PF) 0.25 % IJ SOLN
INTRAMUSCULAR | Status: DC | PRN
  Administered 2011-10-29: 7 mL

## 2011-10-29 MED ORDER — DEXAMETHASONE SODIUM PHOSPHATE 10 MG/ML IJ SOLN
INTRAMUSCULAR | Status: AC
  Filled 2011-10-29: qty 1

## 2011-10-29 MED ORDER — FENTANYL CITRATE 0.05 MG/ML IJ SOLN
INTRAMUSCULAR | Status: AC
  Filled 2011-10-29: qty 5

## 2011-10-29 MED ORDER — NEOSTIGMINE METHYLSULFATE 1 MG/ML IJ SOLN
INTRAMUSCULAR | Status: AC
Start: 2011-10-29 — End: 2011-10-29
  Filled 2011-10-29: qty 10

## 2011-10-29 MED ORDER — KETOROLAC TROMETHAMINE 30 MG/ML IJ SOLN
INTRAMUSCULAR | Status: DC | PRN
  Administered 2011-10-29: 30 mg via INTRAVENOUS

## 2011-10-29 MED ORDER — PROPOFOL 10 MG/ML IV EMUL
INTRAVENOUS | Status: DC | PRN
  Administered 2011-10-29: 200 mg via INTRAVENOUS

## 2011-10-29 MED ORDER — ROCURONIUM BROMIDE 100 MG/10ML IV SOLN
INTRAVENOUS | Status: DC | PRN
  Administered 2011-10-29: 40 mg via INTRAVENOUS

## 2011-10-29 MED ORDER — MIDAZOLAM HCL 5 MG/5ML IJ SOLN
INTRAMUSCULAR | Status: DC | PRN
  Administered 2011-10-29: 2 mg via INTRAVENOUS

## 2011-10-29 MED ORDER — PROPOFOL 10 MG/ML IV EMUL
INTRAVENOUS | Status: AC
  Filled 2011-10-29: qty 20

## 2011-10-29 MED ORDER — ROCURONIUM BROMIDE 50 MG/5ML IV SOLN
INTRAVENOUS | Status: AC
  Filled 2011-10-29: qty 1

## 2011-10-29 MED ORDER — MIDAZOLAM HCL 2 MG/2ML IJ SOLN
INTRAMUSCULAR | Status: AC
  Filled 2011-10-29: qty 2

## 2011-10-29 MED ORDER — LIDOCAINE HCL (CARDIAC) 20 MG/ML IV SOLN
INTRAVENOUS | Status: DC | PRN
  Administered 2011-10-29: 80 mg via INTRAVENOUS

## 2011-10-29 MED ORDER — FENTANYL CITRATE 0.05 MG/ML IJ SOLN
INTRAMUSCULAR | Status: DC | PRN
  Administered 2011-10-29: 150 ug via INTRAVENOUS

## 2011-10-29 MED ORDER — OXYCODONE-ACETAMINOPHEN 5-325 MG PO TABS: 1.0000 | ORAL_TABLET | ORAL | Status: AC | PRN

## 2011-10-29 MED ORDER — LACTATED RINGERS IV SOLN: INTRAVENOUS | Status: DC

## 2011-10-29 MED ORDER — DEXAMETHASONE SODIUM PHOSPHATE 4 MG/ML IJ SOLN
INTRAMUSCULAR | Status: DC | PRN
  Administered 2011-10-29: 10 mg via INTRAVENOUS

## 2011-10-29 MED ORDER — ONDANSETRON HCL 4 MG/2ML IJ SOLN
INTRAMUSCULAR | Status: AC
  Filled 2011-10-29: qty 2

## 2011-10-29 SURGICAL SUPPLY — 16 items
CHLORAPREP W/TINT 26ML (MISCELLANEOUS) ×2 IMPLANT
DRSG COVERLET 3X3 (GAUZE/BANDAGES/DRESSINGS) ×2 IMPLANT
ELECT REM PT RETURN 9FT ADLT (ELECTROSURGICAL)
ELECTRODE REM PT RTRN 9FT ADLT (ELECTROSURGICAL) IMPLANT
GLOVE BIO SURGEON STRL SZ 6.5 (GLOVE) ×4 IMPLANT
GLOVE BIOGEL PI IND STRL 6.5 (GLOVE) ×2 IMPLANT
GLOVE BIOGEL PI INDICATOR 6.5 (GLOVE) ×2
GLOVE SURG SS PI 6.0 STRL IVOR (GLOVE) ×2 IMPLANT
GOWN PREVENTION PLUS LG XLONG (DISPOSABLE) ×4 IMPLANT
NS IRRIG 1000ML POUR BTL (IV SOLUTION) ×2 IMPLANT
PACK LAPAROSCOPY BASIN (CUSTOM PROCEDURE TRAY) ×2 IMPLANT
SUT MON AB 4-0 PS1 27 (SUTURE) ×2 IMPLANT
SUT VICRYL 0 UR6 27IN ABS (SUTURE) ×2 IMPLANT
TOWEL OR 17X24 6PK STRL BLUE (TOWEL DISPOSABLE) ×4 IMPLANT
TROCAR BALLN 12MMX100 BLUNT (TROCAR) ×2 IMPLANT
WATER STERILE IRR 1000ML POUR (IV SOLUTION) ×2 IMPLANT

## 2011-10-29 NOTE — Anesthesia Postprocedure Evaluation (Signed)
  Anesthesia Post-op Note  Patient: Anna Hawkins  Procedure(s) Performed:  LAPAROSCOPIC TUBAL LIGATION  Patient is awake and responsive. Pain and nausea are reasonably well controlled. Vital signs are stable and clinically acceptable. Oxygen saturation is clinically acceptable. There are no apparent anesthetic complications at this time. Patient is ready for discharge.

## 2011-10-29 NOTE — Op Note (Signed)
Anna Hawkins 10/29/2011  PREOPERATIVE DIAGNOSIS:  Undesired fertility  POSTOPERATIVE DIAGNOSIS:  Undesired fertility  PROCEDURE:  Laparoscopic Bilateral Tubal Sterilization    SURGEON: Dr. Catalina Antigua  ANESTHESIA:  General endotracheal  COMPLICATIONS:  None immediate.  ESTIMATED BLOOD LOSS:  Less than 20 ml.  FLUIDS: 1600 ml LR.  URINE OUTPUT:  100 ml of clear urine.  INDICATIONS: 35 y.o. [id number]  with undesired fertility, desires permanent sterilization. Other reversible forms of contraception were discussed with patient; she declines all other modalities.  Risks of procedure discussed with patient including permanence of method, bleeding, infection, injury to surrounding organs and need for additional procedures including laparotomy, risk of regret.  Failure risk of 0.5-1% with increased risk of ectopic gestation if pregnancy occurs was also discussed with patient.      FINDINGS:  Normal uterus, tubes, and ovaries.  TECHNIQUE:  The patient was taken to the operating room where general anesthesia was obtained without difficulty.  She was then placed in the dorsal lithotomy position and prepared and draped in sterile fashion.  After an adequate timeout was performed, a bivalved speculum was then placed in the patient's vagina, and the anterior lip of cervix grasped with the single-tooth tenaculum.  The uterine manipulator was then advanced into the uterus.  The speculum was removed from the vagina.  Attention was then turned to the patient's abdomen where a 10-mm skin incision was made on the umbilical fold.  The underlying fascia was identified, grasped with Kocher clamps, tented up and entered sharply with mayo scissors. The fascia was tagged with 0-Vicryl. The peritoneum was entered bluntly.The 11 mm trocar and sleeve were introduced into the abdominal cavityl.  Intraperitoneal placement was confirmed with the use of the laparoscope. Pneumoperitoneum was achieved by the  insufflation of CO2 gas. A survey of the patient's pelvis and abdomen revealed entirely normal anatomy.  The fallopian tubes were observed and found to be normal in appearance. Bipolar forceps was then advanced through the operative port and used to coagulate a 3-cm portion of the left tube in the mid isthmic area.  Good blanching and coagulation was noted at the site of the application.  There was no bleeding noted in the mesosalpinx.  A similar process was carried out on the right fallopian tube.   Good hemostasis was noted overall. The instruments were then removed from the patient's abdomen and the fascial incision was repaired with 0 Vicryl, and the skin was closed with 4-0 Vicryl. The uterine manipulator and the tenaculum were removed from the vagina without complications. The patient tolerated the procedure well.  Sponge, lap, and needle counts were correct times two.  The patient was then taken to the recovery room awake, extubated and in stable  in stable condition.

## 2011-10-29 NOTE — Anesthesia Preprocedure Evaluation (Signed)
Anesthesia Evaluation  Patient identified by MRN, date of birth, ID band Patient awake    Reviewed: Allergy & Precautions, H&P , NPO status , Patient's Chart, lab work & pertinent test results  Airway Mallampati: III TM Distance: >3 FB Neck ROM: full    Dental  (+) Teeth Intact   Pulmonary asthma ,  clear to auscultation  Pulmonary exam normal       Cardiovascular hypertension, On Medications regular Normal    Neuro/Psych Negative Neurological ROS  Negative Psych ROS   GI/Hepatic negative GI ROS, Neg liver ROS,   Endo/Other  Negative Endocrine ROSGestationalMorbid obesity  Renal/GU negative Renal ROS  Genitourinary negative   Musculoskeletal   Abdominal   Peds  Hematology negative hematology ROS (+)   Anesthesia Other Findings   Reproductive/Obstetrics                           Anesthesia Physical Anesthesia Plan  ASA: III  Anesthesia Plan: General   Post-op Pain Management:    Induction: Intravenous  Airway Management Planned: Oral ETT  Additional Equipment:   Intra-op Plan:   Post-operative Plan: Extubation in OR  Informed Consent: I have reviewed the patients History and Physical, chart, labs and discussed the procedure including the risks, benefits and alternatives for the proposed anesthesia with the patient or authorized representative who has indicated his/her understanding and acceptance.   Dental Advisory Given  Plan Discussed with: Anesthesiologist, CRNA and Surgeon  Anesthesia Plan Comments:         Anesthesia Quick Evaluation

## 2011-10-29 NOTE — H&P (Signed)
Anna Hawkins is an 35 y.o. female [phone] with DM and CHTN presenting today for scheduled bilatereal tubal ligation. Patient with undesired fertility.  Pertinent Gynecological History: Bleeding: regular Contraception: condoms DES exposure: denies Blood transfusions: none Previous GYN Procedures: DNC  Last mammogram: n/a  Last pap: normal Date: 02/2011 OB History: G6, P2042   Menstrual History: Patient's last menstrual period was 10/02/2011.    Past Medical History  Diagnosis Date  . Asthma   . Chlamydia   . Obesity   . Fatty liver   . Hx of shoulder dystocia, prior pregnancy, currently pregnant   . Pregnancy induced hypertension     diagnosed with pregnancy-started on aldomet-delivered 08/28/11  . Diabetes mellitus     Past Surgical History  Procedure Date  . Therapeutic abortion   . Wisdom tooth extraction     Family History  Problem Relation Age of Onset  . Diabetes Mother   . Hypertension Mother   . Diabetes Brother   . Hypertension Brother   . Diabetes Father   . Cancer Father     Social History:  reports that she has never smoked. She has never used smokeless tobacco. She reports that she does not drink alcohol or use illicit drugs.  Allergies:  Allergies  Allergen Reactions  . Lisinopril Cough    Prescriptions prior to admission  Medication Sig Dispense Refill  . albuterol (PROVENTIL HFA;VENTOLIN HFA) 108 (90 BASE) MCG/ACT inhaler Inhale 2 puffs into the lungs every 6 (six) hours as needed for wheezing or shortness of breath.  1 Inhaler  4  . losartan-hydrochlorothiazide (HYZAAR) 100-25 MG per tablet Take 1 tablet by mouth daily.  30 tablet  11  . Fe Fum-FePoly-FA-Vit C-Vit B3 (INTEGRA F) 125-1 MG CAPS Take 1 tablet by mouth daily.  30 capsule  1  . prenatal vitamin w/FE, FA (PRENATAL 1 + 1) 27-1 MG TABS Take 1 tablet by mouth daily.  30 each  1    Review of Systems  All other systems reviewed and are negative.    Blood pressure 151/92, pulse  87, temperature 97.9 F (36.6 C), temperature source Oral, resp. rate 18, last menstrual period 10/02/2011, SpO2 100.00%. Physical Exam  Constitutional: She is oriented to person, place, and time. She appears well-developed and well-nourished.       obese  HENT:  Head: Normocephalic and atraumatic.  Eyes: EOM are normal.  Neck: Normal range of motion. Neck supple.  Cardiovascular: Normal rate and regular rhythm.   Respiratory: Effort normal and breath sounds normal.  GI: Soft. Bowel sounds are normal.       obese  Neurological: She is alert and oriented to person, place, and time.  Skin: Skin is warm.    Results for orders placed during the hospital encounter of 10/29/11 (from the past 24 hour(s))  PREGNANCY, URINE     Status: Normal   Collection Time   10/29/11  1:53 PM      Component Value Range   Preg Test, Ur NEGATIVE    GLUCOSE, CAPILLARY     Status: Abnormal   Collection Time   10/29/11  2:10 PM      Component Value Range   Glucose-Capillary 192 (*) 70 - 99 (mg/dL)    No results found.  Assessment/Plan: 35 yo [id number] with undesired fertility presenting today for sterilization procedure. -Risk, benefits and alternatives explained including but not limited to risk of bleeding, infection and damage to adjacent organs. Patient verbalized understanding. All questions were  answered. Patient scheduled for laparoscopic tubal ligation. It was explained to the patient that there is a high likelihood that this procedure with decrease her ability to conceive.  [name] 10/29/2011, 3:26 PM

## 2011-10-29 NOTE — Transfer of Care (Signed)
Immediate Anesthesia Transfer of Care Note  Patient: Anna Hawkins  Procedure(s) Performed:  LAPAROSCOPIC TUBAL LIGATION  Patient Location: PACU  Anesthesia Type: General  Level of Consciousness: awake and oriented  Airway & Oxygen Therapy: Patient Spontanous Breathing and Patient connected to nasal cannula oxygen  Post-op Assessment: Report given to PACU RN and Post -op Vital signs reviewed and stable  Post vital signs: Reviewed and stable  Complications: No apparent anesthesia complications

## 2011-11-02 ENCOUNTER — Encounter (HOSPITAL_COMMUNITY): Payer: Self-pay | Admitting: Obstetrics and Gynecology

## 2011-11-18 ENCOUNTER — Encounter: Payer: Self-pay | Admitting: Obstetrics and Gynecology

## 2011-11-18 ENCOUNTER — Ambulatory Visit (INDEPENDENT_AMBULATORY_CARE_PROVIDER_SITE_OTHER): Payer: Medicaid Other | Admitting: Obstetrics and Gynecology

## 2011-11-18 VITALS — BP 131/81 | HR 95 | Temp 97.8°F | Resp 20 | Ht 64.0 in | Wt 309.5 lb

## 2011-11-18 DIAGNOSIS — Z302 Encounter for sterilization: Secondary | ICD-10-CM

## 2011-11-18 DIAGNOSIS — Z9889 Other specified postprocedural states: Secondary | ICD-10-CM

## 2011-11-18 DIAGNOSIS — E669 Obesity, unspecified: Secondary | ICD-10-CM

## 2011-11-18 DIAGNOSIS — Z09 Encounter for follow-up examination after completed treatment for conditions other than malignant neoplasm: Secondary | ICD-10-CM

## 2011-11-18 NOTE — Progress Notes (Signed)
35 yo [id number] s/p lsc BTL 10/2011 presenting today for post-op check. Patient is doing well without any complaints  Filed Vitals:   11/18/11 1329  Height: 5\' 4"  (1.626 m)  Weight: 140.388 kg (309 lb 8 oz)   Abd:S/NT/ND/obese Inc: no erythema, induration or drainage. Healed beautifully Ext: NT, equal in size  A/P 35 yo [id number] s/p LSC BTL 10/2011 here for post op check -Patient medically cleared to resume all activities of daily living - patient to return in March for annual exam

## 2011-11-26 ENCOUNTER — Ambulatory Visit: Payer: Medicaid Other | Admitting: Obstetrics & Gynecology

## 2012-05-08 ENCOUNTER — Emergency Department (INDEPENDENT_AMBULATORY_CARE_PROVIDER_SITE_OTHER): Payer: Medicaid Other

## 2012-05-08 ENCOUNTER — Encounter (HOSPITAL_COMMUNITY): Payer: Self-pay | Admitting: *Deleted

## 2012-05-08 ENCOUNTER — Emergency Department (HOSPITAL_COMMUNITY)
Admission: EM | Admit: 2012-05-08 | Discharge: 2012-05-08 | Disposition: A | Discharge status: Home / Self Care | Payer: Medicaid Other | Source: Home / Self Care | Attending: Emergency Medicine | Admitting: Emergency Medicine

## 2012-05-08 DIAGNOSIS — J039 Acute tonsillitis, unspecified: Secondary | ICD-10-CM

## 2012-05-08 DIAGNOSIS — J209 Acute bronchitis, unspecified: Secondary | ICD-10-CM

## 2012-05-08 LAB — POCT RAPID STREP A: Streptococcus, Group A Screen (Direct): NEGATIVE

## 2012-05-08 MED ORDER — AMOXICILLIN 500 MG PO CAPS: 1000.0000 mg | ORAL_CAPSULE | Freq: Three times a day (TID) | ORAL | Status: AC

## 2012-05-08 MED ORDER — ALBUTEROL SULFATE HFA 108 (90 BASE) MCG/ACT IN AERS: 1.0000 | INHALATION_SPRAY | Freq: Four times a day (QID) | RESPIRATORY_TRACT | Status: DC | PRN

## 2012-05-08 MED ORDER — BENZONATATE 200 MG PO CAPS: 200.0000 mg | ORAL_CAPSULE | Freq: Three times a day (TID) | ORAL | Status: AC | PRN

## 2012-05-08 NOTE — Discharge Instructions (Signed)

## 2012-05-08 NOTE — ED Provider Notes (Signed)
Chief Complaint  Patient presents with  . Nasal Congestion  . Cough    History of Present Illness:   The patient is a 36 year old female with a 5 day history of scratchy throat, cough which is sometimes dry and sometimes productive yellow sputum with blood, wheezing, nasal congestion, rhinorrhea, sinus pressure, itching of the ears, and has felt feverish. She denies any headache, chest pain, nausea, vomiting, or diarrhea.  Review of Systems:  Other than noted above, the patient denies any of the following symptoms. Systemic:  No fever, chills, sweats, fatigue, myalgias, headache, or anorexia. Eye:  No redness, pain or drainage. ENT:  No earache, ear congestion, nasal congestion, sneezing, rhinorrhea, sinus pressure, sinus pain, post nasal drip, or sore throat. Lungs:  No cough, sputum production, wheezing, shortness of breath, or chest pain. GI:  No abdominal pain, nausea, vomiting, or diarrhea. Skin:  No rash or itching.  PMFSH:  Past medical history, family history, social history, meds, and allergies were reviewed.  Physical Exam:   Vital signs:  BP 152/66  Pulse 111  Temp(Src) 99.3 F (37.4 C) (Oral)  Resp 18  SpO2 97%  LMP 04/14/2012 General:  Alert, in no distress. Eye:  No conjunctival injection or drainage. Lids were normal. ENT:  TMs and canals were normal, without erythema or inflammation.  Nasal mucosa was clear and uncongested, without drainage.  Mucous membranes were moist.  Tonsils were slightly enlarged and red with spots of whitish exudate.  There were no oral ulcerations or lesions. Neck:  Supple, no adenopathy, tenderness or mass. Lungs:  No respiratory distress.  Lungs were clear to auscultation, without wheezes, rales or rhonchi.  Breath sounds were clear and equal bilaterally. Lungs were resonant to percussion.  No egophony. Heart:  Regular rhythm, without gallops, murmers or rubs. Skin:  Clear, warm, and dry, without rash or lesions.  Labs:   Results for  orders placed during the hospital encounter of 05/08/12  POCT RAPID STREP A ([facility] ONLY)      Component Value Range   Streptococcus, Group A Screen (Direct) NEGATIVE  NEGATIVE     Radiology:  Dg Chest 2 View  05/08/2012  *RADIOLOGY REPORT*  Clinical Data: Productive cough for the past 5 days.  CHEST - 2 VIEW  Comparison: Chest x-ray 01/04/2008.  Findings: Lung volumes are normal.  No consolidative airspace disease.  No pleural effusions.  No pneumothorax.  No pulmonary nodule or mass noted.  Pulmonary vasculature and the cardiomediastinal silhouette are within normal limits.  IMPRESSION: 1. No radiographic evidence of acute cardiopulmonary disease.  Original Report Authenticated By: Florencia Reasons, M.D.    Assessment:  The primary encounter diagnosis was Tonsillitis. A diagnosis of Acute bronchitis was also pertinent to this visit.  Plan:   1.  The following meds were prescribed:   New Prescriptions   ALBUTEROL (PROVENTIL HFA;VENTOLIN HFA) 108 (90 BASE) MCG/ACT INHALER    Inhale 1-2 puffs into the lungs every 6 (six) hours as needed for wheezing.   AMOXICILLIN (AMOXIL) 500 MG CAPSULE    Take 2 capsules (1,000 mg total) by mouth 3 (three) times daily.   BENZONATATE (TESSALON) 200 MG CAPSULE    Take 1 capsule (200 mg total) by mouth 3 (three) times daily as needed for cough.   2.  The patient was instructed in symptomatic care and handouts were given. 3.  The patient was told to return if becoming worse in any way, if no better in 3 or 4  days, and given some red flag symptoms that would indicate earlier return.   Reuben Likes, MD 05/08/12 [phone]

## 2012-05-08 NOTE — ED Notes (Signed)
Pt with clear sinus drainage/cough onset 15th - per pt tickle back of throat causes dry cough - coughing more at night

## 2013-01-24 NOTE — Progress Notes (Signed)
BP check only 

## 2013-02-20 IMAGING — US US ABDOMEN COMPLETE
1 series · 14 of 25 positions shown · non-contrast
Comparison: None.

CLINICAL DATA: Vomiting.  Right upper quadrant pain.  No history
of surgery.  Elevated white blood cell count.

COMPLETE ABDOMINAL ULTRASOUND

[Series 1: us abdomen complete · 14 of 77 slices shown]
[im 1/77]
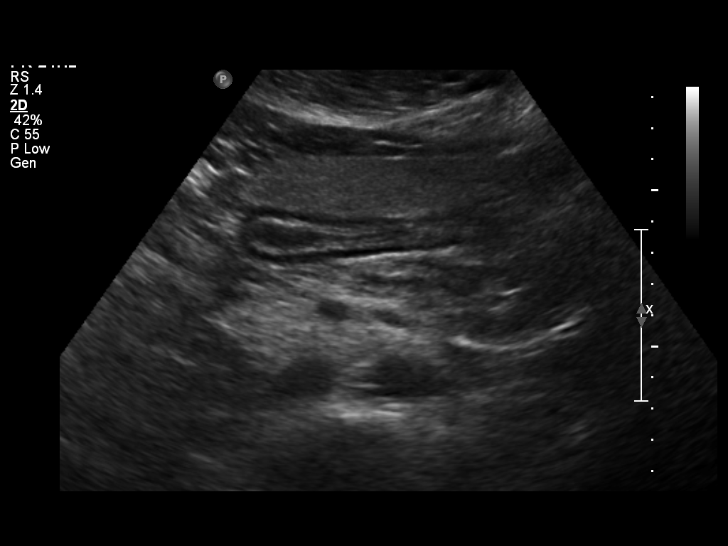
[im 7/77]
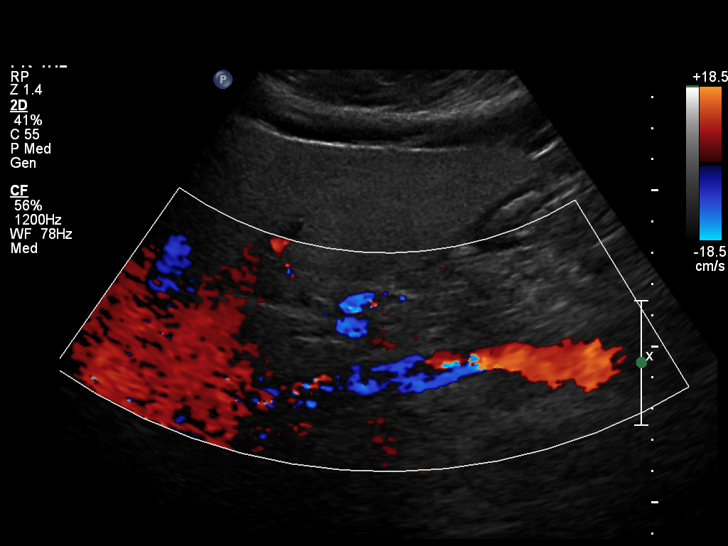
[im 13/77]
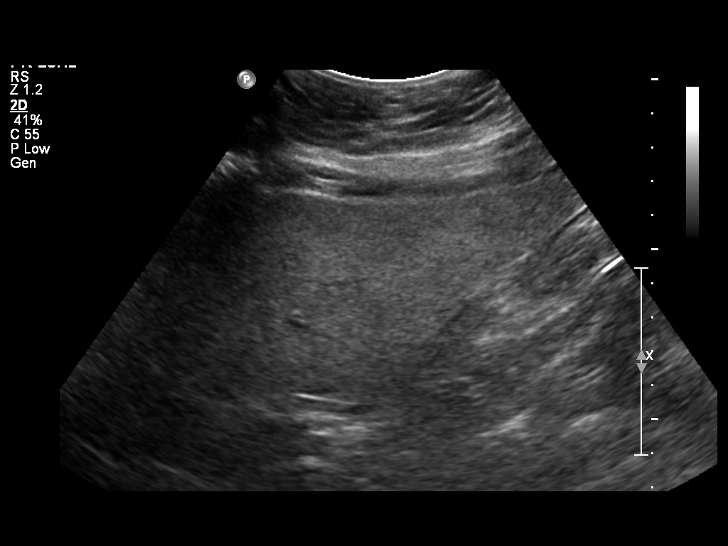
[im 20/77]
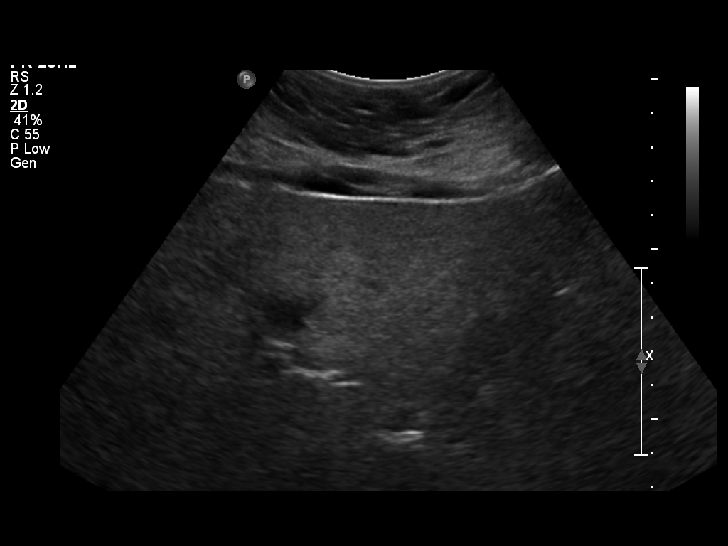
[im 26/77]
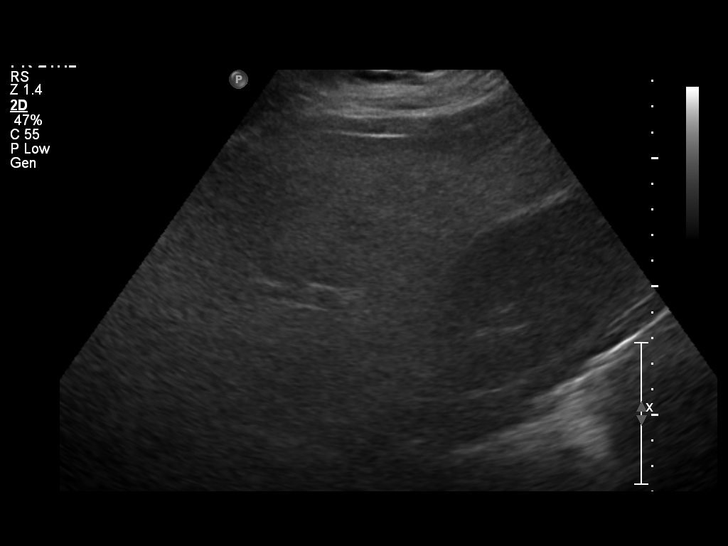
[im 29/77]
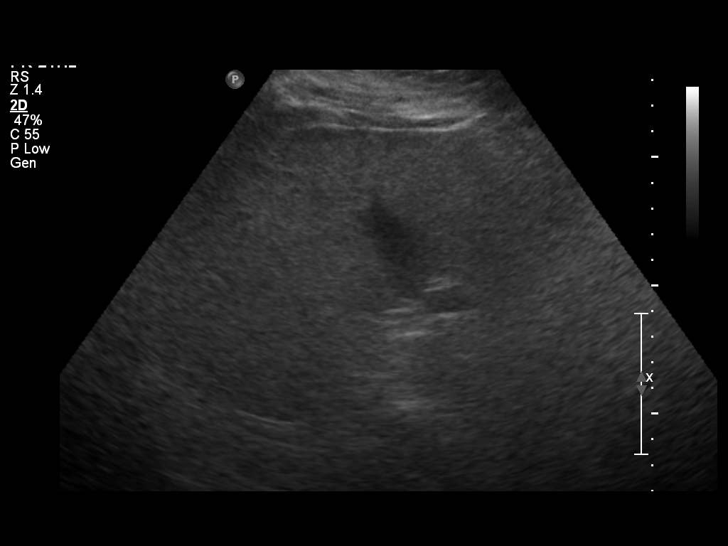
[im 35/77]
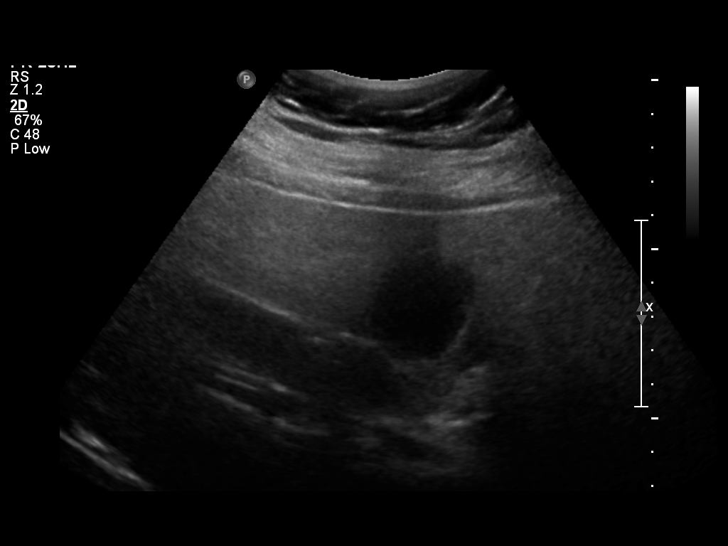
[im 42/77]
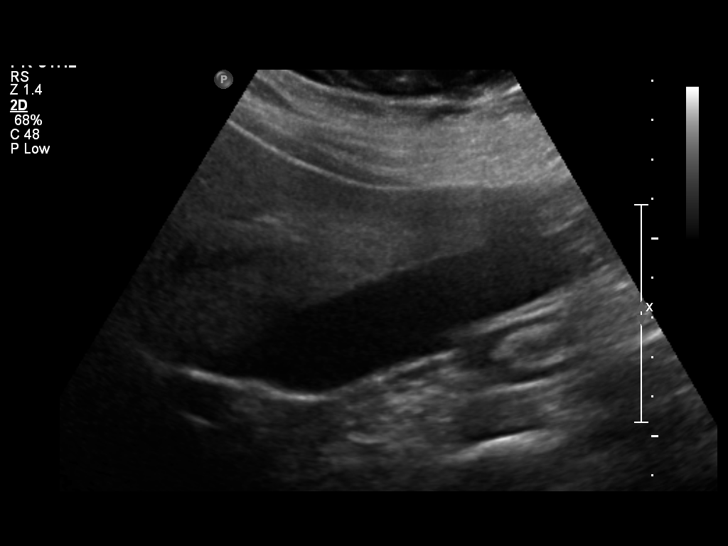
[im 48/77]
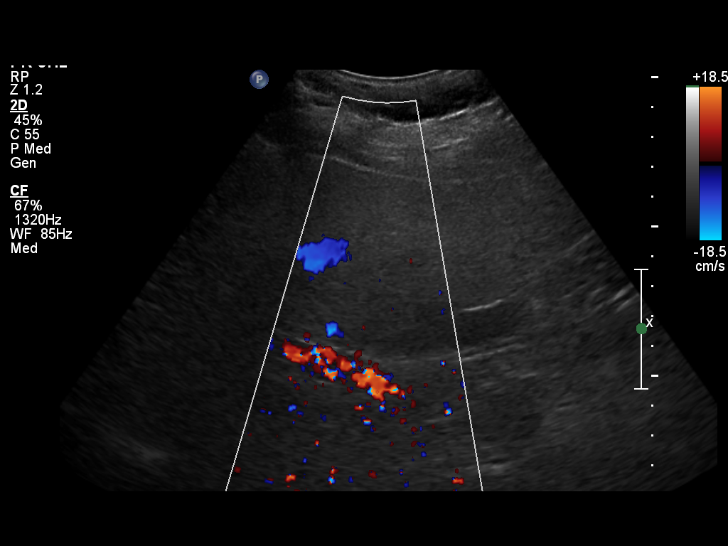
[im 51/77]
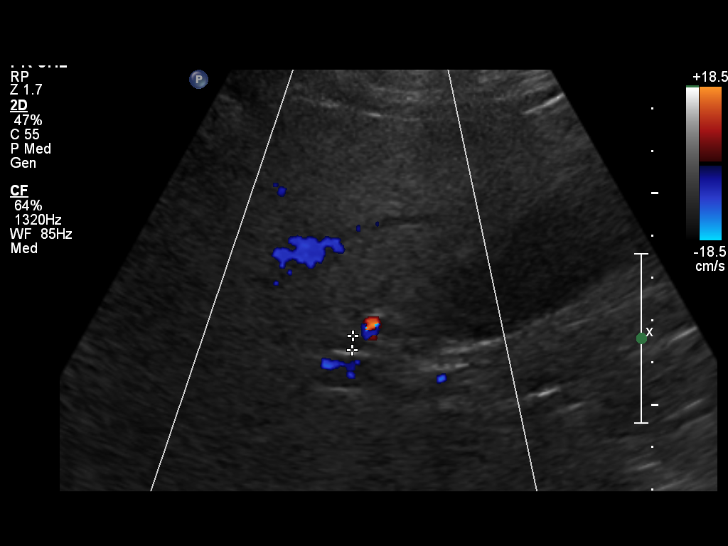
[im 58/77]
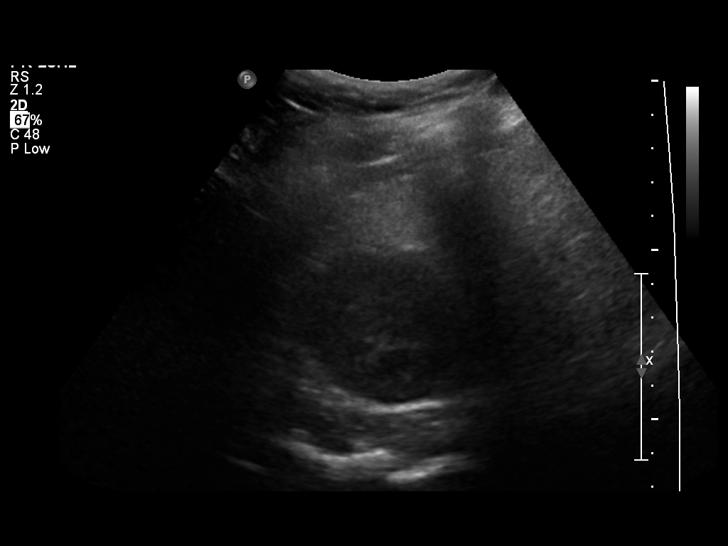
[im 64/77]
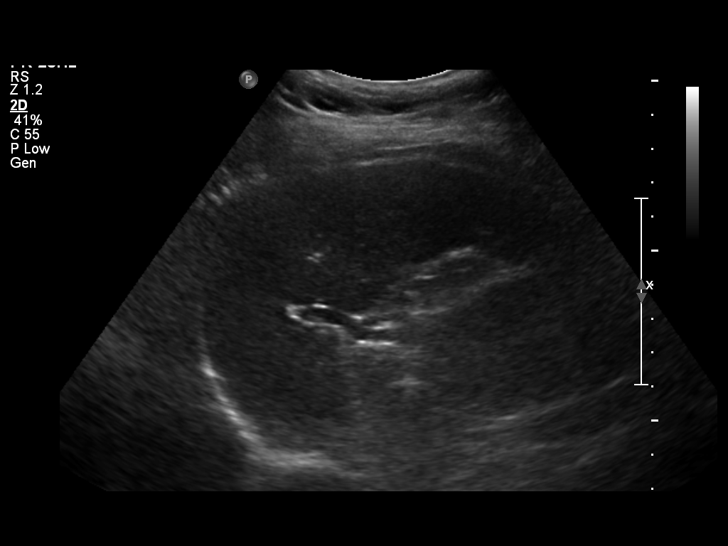
[im 70/77]
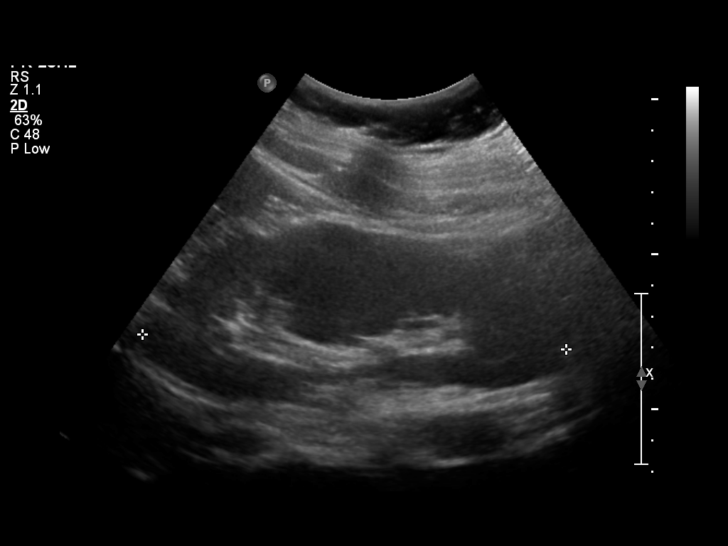
[im 77/77]
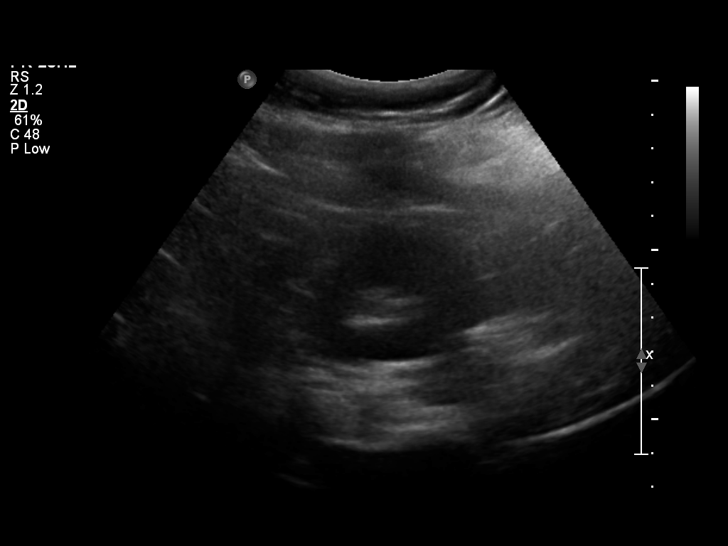

[14 of 25 positions shown; findings below may reference images not displayed]

FINDINGS: Gallbladder:  No gallstones, gallbladder wall thickening, or
pericholecystic fluid.

Common bile duct:  3.4 mm, normal.

Liver:  Fatty infiltration with focal fatty sparing adjacent
gallbladder fossa.  No focal mass lesions.  No intrahepatic biliary
ductal dilation.  Hyperechoic when compared to the adjacent right
kidney compatible with hepatic steatosis.

IVC:  Normal.

Pancreas:  Suboptimal visualization secondary to overlying bowel
gas.

Spleen:  9.6 cm with normal echotexture.

Right Kidney:  13.1 cm. Normal echotexture.  Normal central sinus
echo complex.  No calculi or hydronephrosis.

Left Kidney:  13.6 cm. Normal echotexture.  Normal central sinus
echo complex.  No calculi or hydronephrosis.

Abdominal aorta:  No aneurysm identified.
IMPRESSION: No acute abnormality.  Hyperechoic liver compared to the adjacent
right kidney compatible with hepatic steatosis.

## 2013-02-20 IMAGING — US US OB COMP LESS 14 WK
1 series · 13 of 28 positions shown · non-contrast
Comparison: none

[Series 1: us ob comp less 14 wks · 13 of 33 slices shown]
[im 2/33]
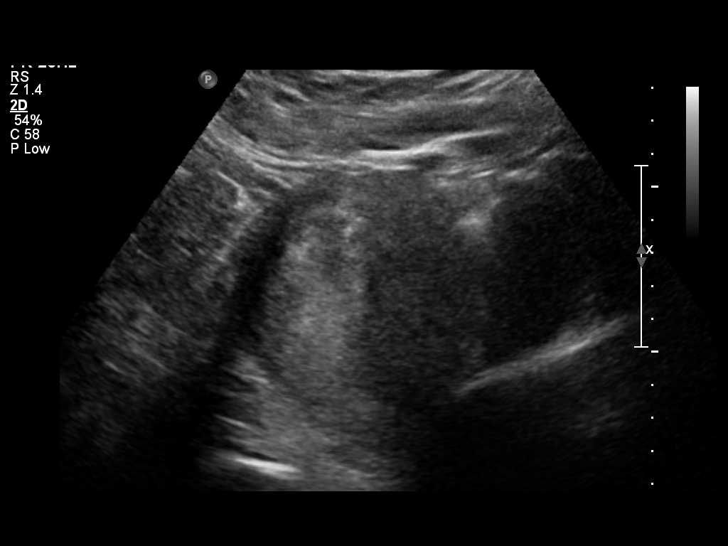
[im 4/33]
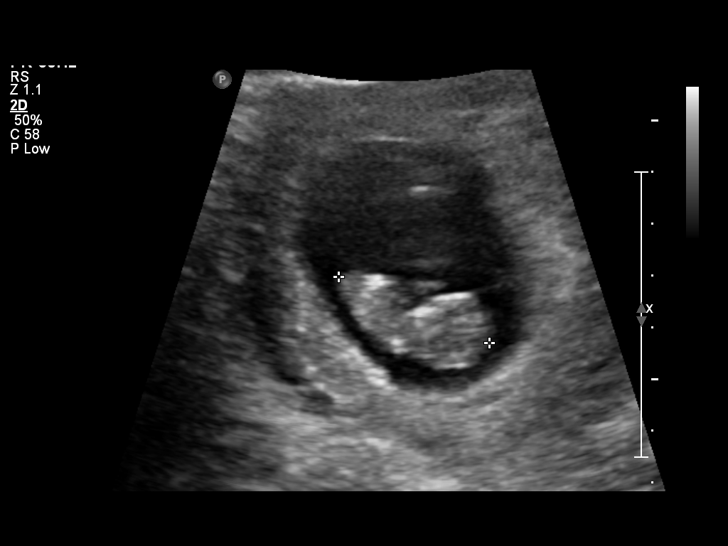
[im 6/33]
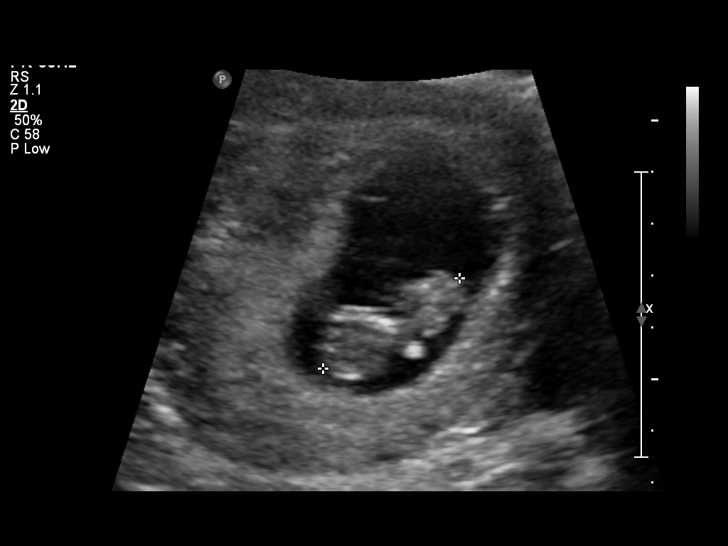
[im 9/33]
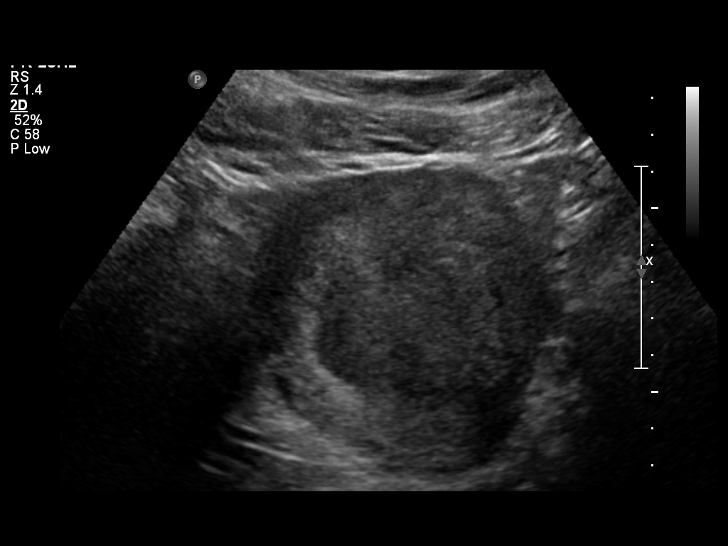
[im 11/33]
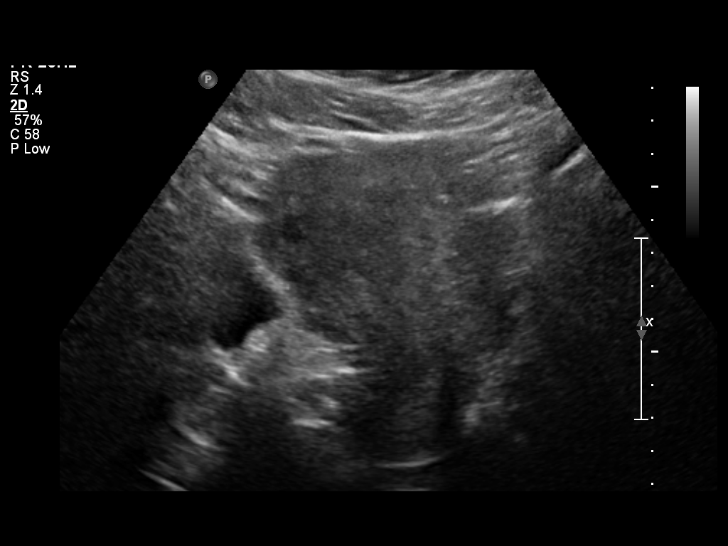
[im 14/33]
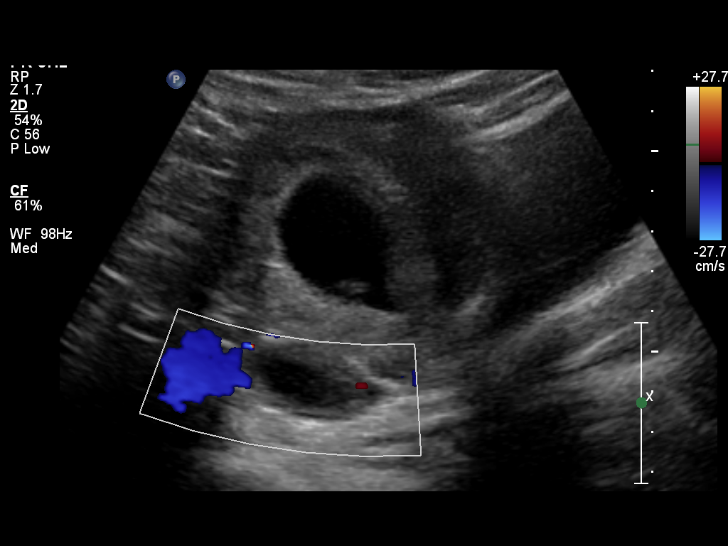
[im 17/33]
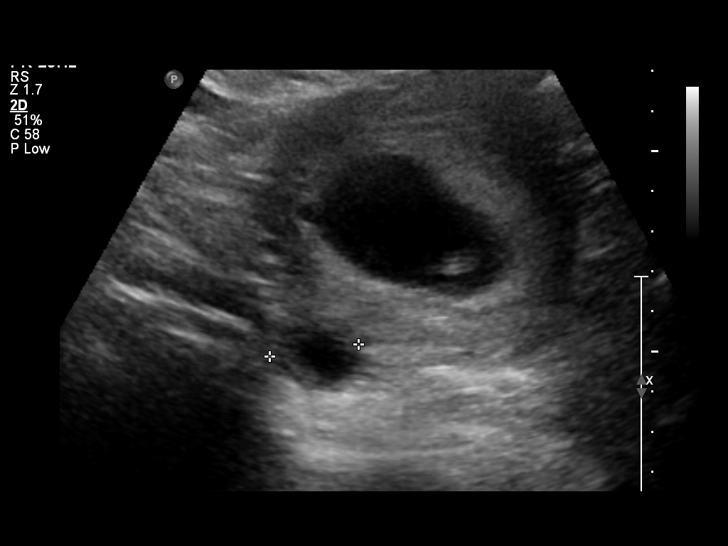
[im 19/33]
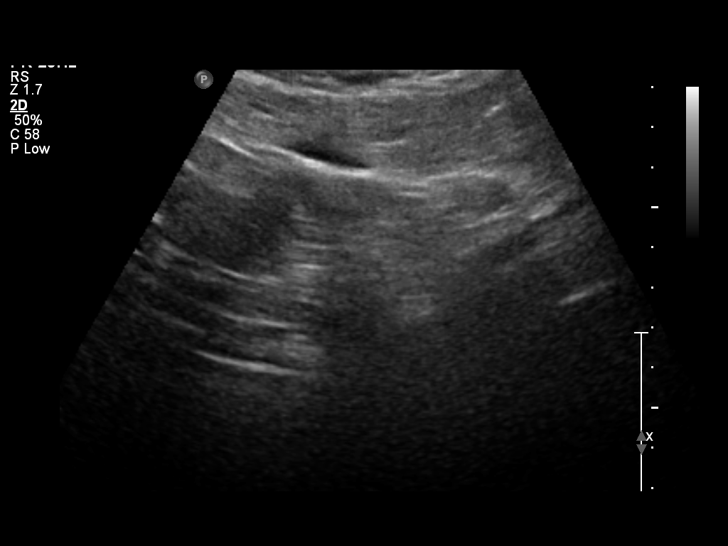
[im 22/33]
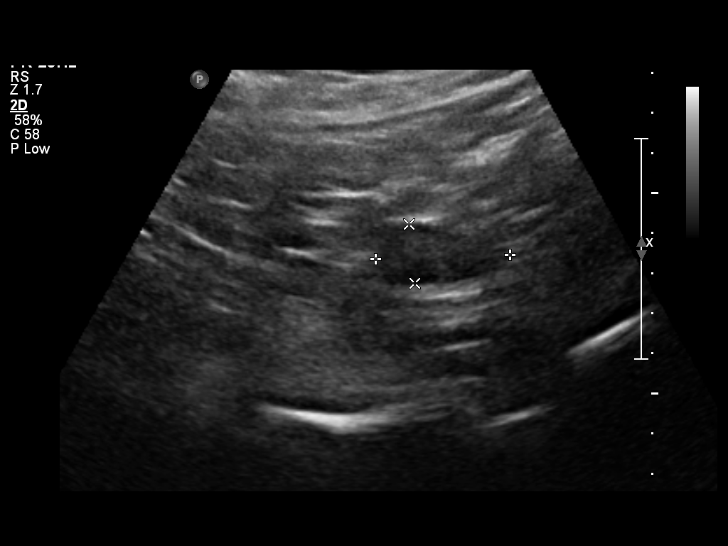
[im 24/33]
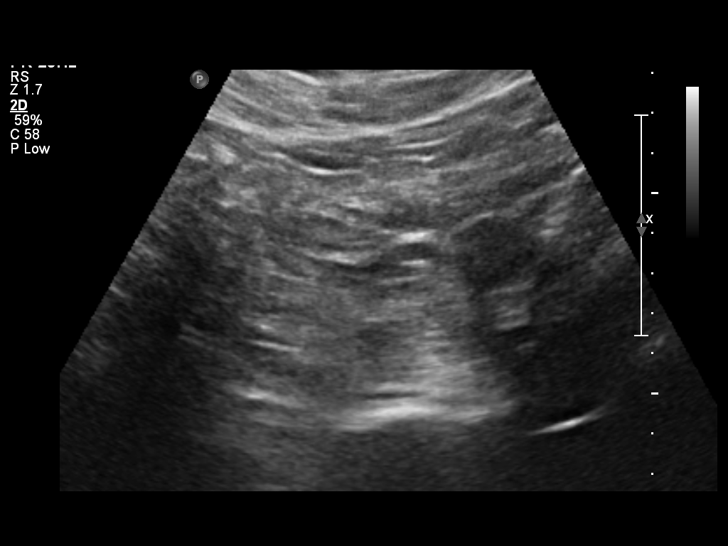
[im 27/33]
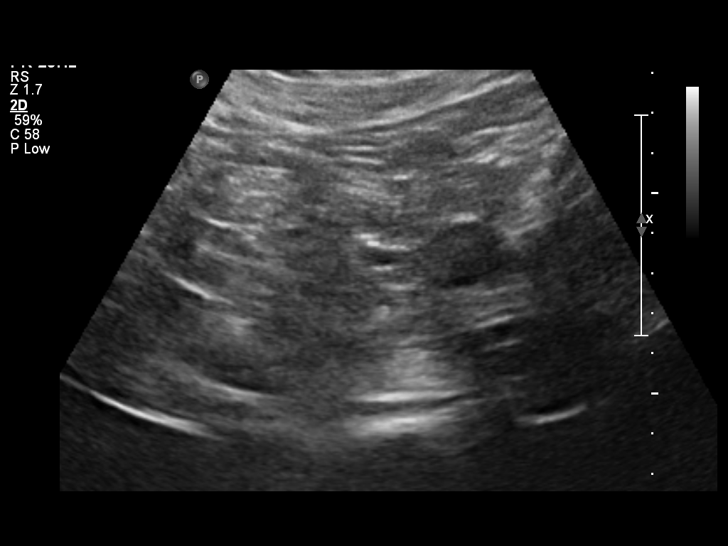
[im 29/33]
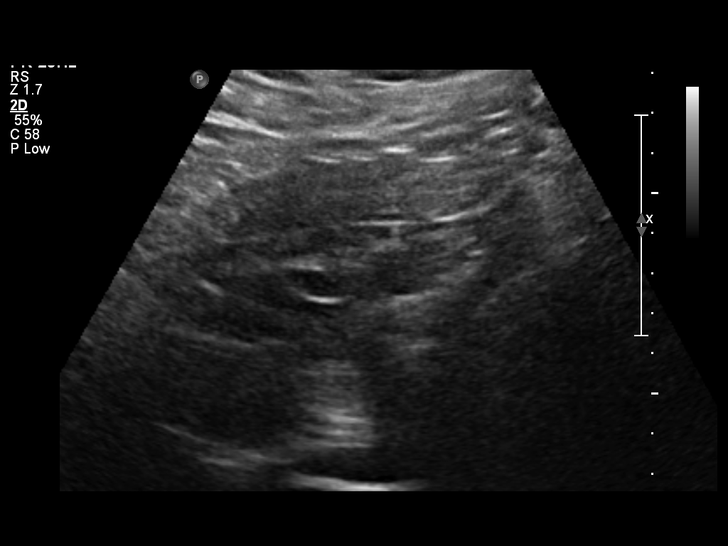
[im 31/33]
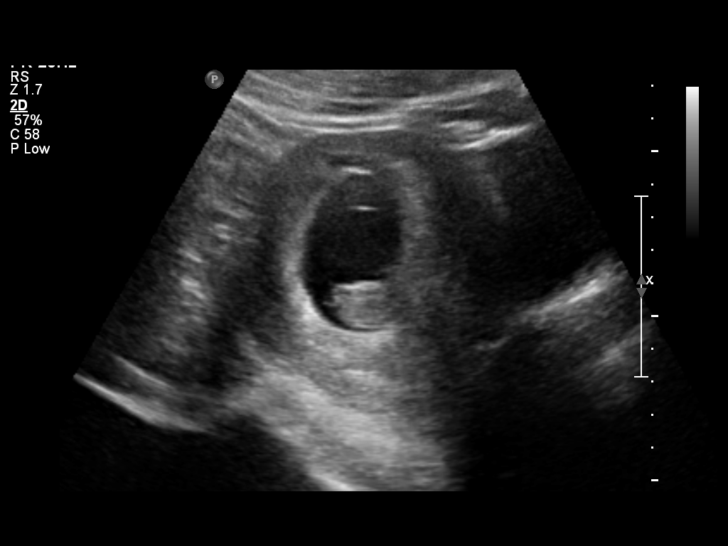

[13 of 28 positions shown; findings below may reference images not displayed]

OBSTETRICS REPORT
                      (Signed Final 02/10/2011 [DATE])

 Order#:         [id number]
Procedures

 US OB COMP LESS 14 WKS                                76801.0
Indications

 Hyperemesis gravidarum
 Hypertension - Chronic/Pre-existing
 Diabetes - Pregestational
 Poor obstetric history: Previous gestational
 diabetes
 Advanced maternal age (AMA), Multigravida
 Morbid Obesity
 Previous spontaneous abortion x 2
Fetal Evaluation

 Preg. Location:    Intrauterine
 Gest. Sac:         Intrauterine
 Yolk Sac:          Not visualized
 Fetal Pole:        Visualized
 Fetal Heart Rate:  176                          bpm
 Cardiac Activity:  Observed

 Comment:    No subchorionic hemorrhage seen.
Biometry

 CRL:       32  mm     G. Age:  9w 6d                  EDD:    09/09/11
Gestational Age

 LMP:           10w 2d        Date:  11/30/10                 EDD:   09/06/11
 Best:          10w 2d     Det. By:  LMP  (11/30/10)          EDD:   09/06/11
Cervix Uterus Adnexa

 Cervix:       Normal appearance by transabdominal scan.
 Left Ovary:    Within normal limits.
 Right Ovary:   Within normal limits. Small corpus luteum noted.

 Adnexa:     No abnormality visualized.
Impression

  There is a single living intrauterine gestation with CRL
 indicating a gestational age of 9w6d.  This is concordant with
 the assigned EGA.  Please follow-up with full anatomy scan
 at 19-21 weeks.

## 2013-07-17 ENCOUNTER — Encounter (HOSPITAL_COMMUNITY): Payer: Self-pay | Admitting: Emergency Medicine

## 2013-07-17 ENCOUNTER — Emergency Department (HOSPITAL_COMMUNITY)
Admission: EM | Admit: 2013-07-17 | Discharge: 2013-07-18 | Disposition: A | Discharge status: Home / Self Care | Payer: Medicaid Other | Attending: Emergency Medicine | Admitting: Emergency Medicine

## 2013-07-17 DIAGNOSIS — M25569 Pain in unspecified knee: Secondary | ICD-10-CM | POA: Insufficient documentation

## 2013-07-17 DIAGNOSIS — E669 Obesity, unspecified: Secondary | ICD-10-CM | POA: Insufficient documentation

## 2013-07-17 DIAGNOSIS — Z8742 Personal history of other diseases of the female genital tract: Secondary | ICD-10-CM | POA: Insufficient documentation

## 2013-07-17 DIAGNOSIS — R519 Headache, unspecified: Secondary | ICD-10-CM

## 2013-07-17 DIAGNOSIS — M25562 Pain in left knee: Secondary | ICD-10-CM

## 2013-07-17 DIAGNOSIS — E119 Type 2 diabetes mellitus without complications: Secondary | ICD-10-CM | POA: Insufficient documentation

## 2013-07-17 DIAGNOSIS — R112 Nausea with vomiting, unspecified: Secondary | ICD-10-CM | POA: Insufficient documentation

## 2013-07-17 DIAGNOSIS — Z8719 Personal history of other diseases of the digestive system: Secondary | ICD-10-CM | POA: Insufficient documentation

## 2013-07-17 DIAGNOSIS — R51 Headache: Secondary | ICD-10-CM | POA: Insufficient documentation

## 2013-07-17 DIAGNOSIS — J45909 Unspecified asthma, uncomplicated: Secondary | ICD-10-CM | POA: Insufficient documentation

## 2013-07-17 DIAGNOSIS — Z8619 Personal history of other infectious and parasitic diseases: Secondary | ICD-10-CM | POA: Insufficient documentation

## 2013-07-17 DIAGNOSIS — H53149 Visual discomfort, unspecified: Secondary | ICD-10-CM | POA: Insufficient documentation

## 2013-07-17 MED ORDER — SODIUM CHLORIDE 0.9 % IV BOLUS (SEPSIS): 1000.0000 mL | Freq: Once | INTRAVENOUS | Status: DC

## 2013-07-17 MED ORDER — DIPHENHYDRAMINE HCL 50 MG/ML IJ SOLN
25.0000 mg | Freq: Once | INTRAMUSCULAR | Status: DC
  Filled 2013-07-17: qty 1

## 2013-07-17 MED ORDER — METOCLOPRAMIDE HCL 5 MG/ML IJ SOLN
10.0000 mg | Freq: Once | INTRAMUSCULAR | Status: DC
  Filled 2013-07-17: qty 2

## 2013-07-17 MED ORDER — KETOROLAC TROMETHAMINE 30 MG/ML IJ SOLN
30.0000 mg | Freq: Once | INTRAMUSCULAR | Status: DC
  Filled 2013-07-17: qty 1

## 2013-07-17 NOTE — ED Provider Notes (Signed)
CSN: [id number]     Arrival date & time 07/17/13  2132 History     First MD Initiated Contact with Patient 07/17/13 2336     Chief Complaint  Patient presents with  . Knee Pain  . Emesis   (Consider location/radiation/quality/duration/timing/severity/associated sxs/prior Treatment) HPI Comments: Patient is a 37 year old female with a past medical history of chronic migraines who presents with a headache since this morning. Patient reports a gradual onset and progressive worsening of the headache. The pain is sharp, constant and is located in generalized head without radiation. Patient has tried nothing for symptoms without relief. No alleviating/aggravating factors. Patient reports associated nausea and photophobia. Patient denies fever, vomiting, diarrhea, numbness/tingling, weakness, visual changes, congestion, chest pain, SOB, abdominal pain.   Patient also complains of left knee pain for the past 3 days. The pain is aching and severe without radiation. Patient denies injury. She has not tried anything for pain. Weight bearing activity and palpation of the area makes the pain worse. No alleviating factors.      Past Medical History  Diagnosis Date  . Asthma   . Chlamydia   . Obesity   . Fatty liver   . Hx of shoulder dystocia, prior pregnancy, currently pregnant   . Pregnancy induced hypertension     diagnosed with pregnancy-started on aldomet-delivered 08/28/11  . Diabetes mellitus    Past Surgical History  Procedure Laterality Date  . Therapeutic abortion    . Wisdom tooth extraction    . Laparoscopic tubal ligation  10/29/2011    Procedure: LAPAROSCOPIC TUBAL LIGATION;  Surgeon: Catalina Antigua, MD;  Location: [facility];  Service: Gynecology;  Laterality: Bilateral;  . Tubal ligation     Family History  Problem Relation Age of Onset  . Diabetes Mother   . Hypertension Mother   . Diabetes Brother   . Hypertension Brother   . Diabetes Father   . Cancer Father    History   Substance Use Topics  . Smoking status: Never Smoker   . Smokeless tobacco: Never Used  . Alcohol Use: No   OB History   Grav Para Term Preterm Abortions TAB SAB Ect Mult Living   6 2 2  0 4 2 2  0 0 2     Review of Systems  Musculoskeletal: Positive for arthralgias.  Neurological: Positive for headaches.  All other systems reviewed and are negative.    Allergies  Lisinopril  Home Medications  No current outpatient prescriptions on file. BP 159/77  Pulse 83  Temp(Src) 98.5 F (36.9 C) (Oral)  Resp 24  Ht 5\' 3"  (1.6 m)  Wt 280 lb (127.007 kg)  BMI 49.61 kg/m2  SpO2 100%  LMP 06/26/2013 Physical Exam  Nursing note and vitals reviewed. Constitutional: She is oriented to person, place, and time. She appears well-developed and well-nourished. No distress.  HENT:  Head: Normocephalic and atraumatic.  Eyes: Conjunctivae are normal.  Neck: Normal range of motion.  Cardiovascular: Normal rate and regular rhythm.  Exam reveals no gallop and no friction rub.   No murmur heard. Pulmonary/Chest: Effort normal and breath sounds normal. She has no wheezes. She has no rales. She exhibits no tenderness.  Abdominal: Soft. She exhibits no distension. There is no tenderness. There is no rebound and no guarding.  Musculoskeletal: Normal range of motion.  Left knee anterior tenderness to palpation. Full ROM. No swelling or obvious deformity.  Neurological: She is alert and oriented to person, place, and time. Coordination normal.  Speech is goal-oriented. Moves limbs without ataxia.   Skin: Skin is warm and dry.  Psychiatric: She has a normal mood and affect. Her behavior is normal.    ED Course   Procedures (including critical care time)  Labs Reviewed  GLUCOSE, CAPILLARY - Abnormal; Notable for the following:    Glucose-Capillary 156 (*)    All other components within normal limits   Dg Knee Complete 4 Views Left  07/18/2013   *RADIOLOGY REPORT*  Clinical Data: No known  injury.  Generalized knee pain.  LEFT KNEE - COMPLETE 4+ VIEW  Comparison: None.  Findings: No evidence of fracture, malalignment, or osteolysis. Joint spaces maintained.  Negative for effusion.  IMPRESSION: Negative left knee radiographs.   Original Report Authenticated By: Tiburcio Pea   1. Headache   2. Left knee pain     MDM  11:37 PM Patient will have IV fluids, toradol, benadryl, and reglan. Vitals stable and patient afebrile.   1:10 AM Knee xray negative for acute changes. Patient will have recommended Orthopedic follow up. Patient reports relief of her headache. Vitals stable and patient afebrile. No further evaluation needed at this time. Patient instructed to return to the ED with worsening or concerning symptoms.   Emilia Beck, PA-C 07/18/13 0121

## 2013-07-17 NOTE — ED Notes (Signed)
Pt states she has pain to the left side of her body and has a headache and vomiting  Pt states it started about 6pm this evening

## 2013-07-18 ENCOUNTER — Emergency Department (HOSPITAL_COMMUNITY): Payer: Medicaid Other

## 2013-07-18 MED ORDER — KETOROLAC TROMETHAMINE 60 MG/2ML IM SOLN
60.0000 mg | Freq: Once | INTRAMUSCULAR | Status: AC
  Administered 2013-07-18: 60 mg via INTRAMUSCULAR
  Filled 2013-07-18: qty 2

## 2013-07-18 MED ORDER — DIPHENHYDRAMINE HCL 50 MG/ML IJ SOLN
25.0000 mg | Freq: Once | INTRAMUSCULAR | Status: AC
Start: 2013-07-18 — End: 2013-07-18
  Administered 2013-07-18: 25 mg via INTRAMUSCULAR

## 2013-07-18 MED ORDER — METOCLOPRAMIDE HCL 5 MG/ML IJ SOLN
10.0000 mg | Freq: Once | INTRAMUSCULAR | Status: AC
  Administered 2013-07-18: 10 mg via INTRAMUSCULAR

## 2013-07-18 MED ORDER — TRAMADOL HCL 50 MG PO TABS: 50.0000 mg | ORAL_TABLET | Freq: Four times a day (QID) | ORAL | Status: DC | PRN

## 2013-07-18 NOTE — ED Notes (Signed)
Patient transported to X-ray 

## 2013-07-19 NOTE — ED Provider Notes (Signed)
Medical screening examination/treatment/procedure(s) were performed by non-physician practitioner and as supervising physician I was immediately available for consultation/collaboration.   Laray Anger, DO 07/19/13 1751

## 2014-01-24 ENCOUNTER — Encounter (HOSPITAL_COMMUNITY): Payer: Self-pay | Admitting: Emergency Medicine

## 2014-01-24 ENCOUNTER — Emergency Department (HOSPITAL_COMMUNITY)
Admission: EM | Admit: 2014-01-24 | Discharge: 2014-01-24 | Disposition: A | Discharge status: Home / Self Care | Payer: Medicaid Other | Source: Home / Self Care | Attending: Emergency Medicine | Admitting: Emergency Medicine

## 2014-01-24 DIAGNOSIS — N3000 Acute cystitis without hematuria: Secondary | ICD-10-CM

## 2014-01-24 DIAGNOSIS — I10 Essential (primary) hypertension: Secondary | ICD-10-CM

## 2014-01-24 DIAGNOSIS — E119 Type 2 diabetes mellitus without complications: Secondary | ICD-10-CM

## 2014-01-24 LAB — POCT URINALYSIS DIP (DEVICE)
BILIRUBIN URINE: NEGATIVE
BILIRUBIN URINE: NEGATIVE
GLUCOSE, UA: NEGATIVE mg/dL
Glucose, UA: >=1000 mg/dL — AB
HGB URINE DIPSTICK: NEGATIVE
Ketones, ur: NEGATIVE mg/dL
Ketones, ur: NEGATIVE mg/dL
Leukocytes, UA: NEGATIVE
Leukocytes, UA: NEGATIVE
NITRITE: POSITIVE — AB
Nitrite: NEGATIVE
PH: 5.5 (ref 5.0–8.0)
PROTEIN: 30 mg/dL — AB
Protein, ur: NEGATIVE mg/dL
SPECIFIC GRAVITY, URINE: 1.015 (ref 1.005–1.030)
Specific Gravity, Urine: >=1.03 (ref 1.005–1.030)
UROBILINOGEN UA: 0.2 mg/dL (ref 0.0–1.0)
Urobilinogen, UA: 0.2 mg/dL (ref 0.0–1.0)
pH: 5.5 (ref 5.0–8.0)

## 2014-01-24 LAB — POCT PREGNANCY, URINE: Preg Test, Ur: NEGATIVE

## 2014-01-24 LAB — GLUCOSE, CAPILLARY: Glucose-Capillary: 212 mg/dL — ABNORMAL HIGH (ref 70–99)

## 2014-01-24 MED ORDER — GLIPIZIDE ER 2.5 MG PO TB24: 2.5000 mg | ORAL_TABLET | Freq: Every day | ORAL | Status: DC

## 2014-01-24 MED ORDER — PHENAZOPYRIDINE HCL 200 MG PO TABS: 200.0000 mg | ORAL_TABLET | Freq: Three times a day (TID) | ORAL | Status: DC | PRN

## 2014-01-24 MED ORDER — CEPHALEXIN 500 MG PO CAPS: 500.0000 mg | ORAL_CAPSULE | Freq: Three times a day (TID) | ORAL | Status: DC

## 2014-01-24 NOTE — ED Provider Notes (Signed)
Chief Complaint   Chief Complaint  Patient presents with  . Dysuria    History of Present Illness   Anna Hawkins is a 38 year old female with diabetes and hypertension who has had a two-day history of urinary frequency, urgency, and dysuria. She has not had hematuria or malodorous urine. She denies any lower bowel pain. She has had some lower back pain and some nausea but no vomiting. She denies any GYN complaints. No fever or chills. She has had a urinary tract infection before but it's been about 3 years ago.  Review of Systems   Other than as noted above, the patient denies any of the following symptoms: General:  No fevers, chills, or sweats. GI:  No abdominal pain, back pain, nausea, vomiting, diarrhea, or constipation. GU:  No dysuria, frequency, urgency, hematuria, or incontinence. GYN:  No discharge, itching, vulvar pain or lesions, pelvic pain, or abnormal vaginal bleeding.  [name]   Past medical history, family history, social history, meds, and allergies were reviewed.  She's allergic to penicillin and lisinopril. She believes she can take cephalosporins. She has a history of diabetes, hypertension, and asthma. She is trying to control these with diet.  Physical Examination     Vital signs:  BP 156/103  Pulse 100  Temp(Src) 98.3 F (36.8 C) (Oral)  Resp 21  SpO2 100%  LMP 12/29/2013 Gen:  Alert, oriented, in no distress. Lungs:  Clear to auscultation, no wheezes, rales or rhonchi. Heart:  Regular rhythm, no gallop or murmer. Abdomen:  Flat and soft. There was slight suprapubic pain to palpation.  No guarding, or rebound.  No hepato-splenomegaly or mass.  Bowel sounds were normally active.  No hernia. Back:  No CVA tenderness.  Skin:  Clear, warm and dry.  Labs   Results for orders placed during the hospital encounter of 01/24/14  GLUCOSE, CAPILLARY      Result Value Range   Glucose-Capillary 212 (*) 70 - 99 mg/dL  POCT URINALYSIS DIP (DEVICE)      Result  Value Range   Glucose, UA >=1000 (*) NEGATIVE mg/dL   Bilirubin Urine NEGATIVE  NEGATIVE   Ketones, ur NEGATIVE  NEGATIVE mg/dL   Specific Gravity, Urine >=1.030  1.005 - 1.030   Hgb urine dipstick NEGATIVE  NEGATIVE   pH 5.5  5.0 - 8.0   Protein, ur 30 (*) NEGATIVE mg/dL   Urobilinogen, UA 0.2  0.0 - 1.0 mg/dL   Nitrite POSITIVE (*) NEGATIVE   Leukocytes, UA NEGATIVE  NEGATIVE  POCT PREGNANCY, URINE      Result Value Range   Preg Test, Ur NEGATIVE  NEGATIVE  POCT URINALYSIS DIP (DEVICE)      Result Value Range   Glucose, UA NEGATIVE  NEGATIVE mg/dL   Bilirubin Urine NEGATIVE  NEGATIVE   Ketones, ur NEGATIVE  NEGATIVE mg/dL   Specific Gravity, Urine 1.015  1.005 - 1.030   Hgb urine dipstick TRACE (*) NEGATIVE   pH 5.5  5.0 - 8.0   Protein, ur NEGATIVE  NEGATIVE mg/dL   Urobilinogen, UA 0.2  0.0 - 1.0 mg/dL   Nitrite NEGATIVE  NEGATIVE   Leukocytes, UA NEGATIVE  NEGATIVE     A urine culture was obtained.  Results are pending at this time and we will call about any positive results.  Assessment   The primary encounter diagnosis was Acute cystitis. Diagnoses of Type 2 diabetes mellitus and Hypertension were also pertinent to this visit.   No evidence of pyelonephritis.  She needs control of her blood pressure and her diabetes. She was provided with a small prescription for Glucotrol XL 2.5 mg once a day. Suggested she followup as soon as possible and a primary care clinic. Given the name, address, and phone number of the [facility].  Plan   1.  Meds:  The following meds were prescribed:   Discharge Medication List as of 01/24/2014  6:24 PM    START taking these medications   Details  cephALEXin (KEFLEX) 500 MG capsule Take 1 capsule (500 mg total) by mouth 3 (three) times daily., Starting 01/24/2014, Until Discontinued, Normal    glipiZIDE (GLUCOTROL XL) 2.5 MG 24 hr tablet Take 1 tablet (2.5 mg total) by mouth daily with breakfast., Starting 01/24/2014,  Until Discontinued, Normal    phenazopyridine (PYRIDIUM) 200 MG tablet Take 1 tablet (200 mg total) by mouth 3 (three) times daily as needed for pain., Starting 01/24/2014, Until Discontinued, Normal        2.  Patient Education/Counseling:  The patient was given appropriate handouts, self care instructions, and instructed in symptomatic relief. The patient was told to avoid intercourse for 10 days, get extra fluids, and return for a follow up with her primary care doctor at the completion of treatment for a repeat UA and culture.    3.  Follow up:  The patient was told to follow up here if no better in 3 to 4 days, or sooner if becoming worse in any way, and given some red flag symptoms such as fever, persistent vomiting, or severe flank or abdominal pain which would prompt immediate return.     Harden Mo, MD 01/24/14 [phone]

## 2014-01-24 NOTE — ED Notes (Signed)
Dr. Jake Michaelis with pt.

## 2014-01-24 NOTE — ED Notes (Signed)
C/o painful urination since 2/3.  See Dr. Viann Shove assessment.

## 2014-01-24 NOTE — Discharge Instructions (Signed)
Urinary Tract Infection Urinary tract infections (UTIs) can develop anywhere along your urinary tract. Your urinary tract is your body's drainage system for removing wastes and extra water. Your urinary tract includes two kidneys, two ureters, a bladder, and a urethra. Your kidneys are a pair of bean-shaped organs. Each kidney is about the size of your fist. They are located below your ribs, one on each side of your spine. CAUSES Infections are caused by microbes, which are microscopic organisms, including fungi, viruses, and bacteria. These organisms are so small that they can only be seen through a microscope. Bacteria are the microbes that most commonly cause UTIs. SYMPTOMS  Symptoms of UTIs may vary by age and gender of the patient and by the location of the infection. Symptoms in young women typically include a frequent and intense urge to urinate and a painful, burning feeling in the bladder or urethra during urination. Older women and men are more likely to be tired, shaky, and weak and have muscle aches and abdominal pain. A fever may mean the infection is in your kidneys. Other symptoms of a kidney infection include pain in your back or sides below the ribs, nausea, and vomiting. DIAGNOSIS To diagnose a UTI, your caregiver will ask you about your symptoms. Your caregiver also will ask to provide a urine sample. The urine sample will be tested for bacteria and white blood cells. White blood cells are made by your body to help fight infection. TREATMENT  Typically, UTIs can be treated with medication. Because most UTIs are caused by a bacterial infection, they usually can be treated with the use of antibiotics. The choice of antibiotic and length of treatment depend on your symptoms and the type of bacteria causing your infection. HOME CARE INSTRUCTIONS  If you were prescribed antibiotics, take them exactly as your caregiver instructs you. Finish the medication even if you feel better after you  have only taken some of the medication.  Drink enough water and fluids to keep your urine clear or pale yellow.  Avoid caffeine, tea, and carbonated beverages. They tend to irritate your bladder.  Empty your bladder often. Avoid holding urine for long periods of time.  Empty your bladder before and after sexual intercourse.  After a bowel movement, women should cleanse from front to back. Use each tissue only once. SEEK MEDICAL CARE IF:   You have back pain.  You develop a fever.  Your symptoms do not begin to resolve within 3 days. SEEK IMMEDIATE MEDICAL CARE IF:   You have severe back pain or lower abdominal pain.  You develop chills.  You have nausea or vomiting.  You have continued burning or discomfort with urination. MAKE SURE YOU:   Understand these instructions.  Will watch your condition.  Will get help right away if you are not doing well or get worse. Document Released: 09/16/2005 Document Revised: 06/07/2012 Document Reviewed: 01/15/2012 [facility] Patient Information 2014 [place].  Blood pressure over the ideal can put you at higher risk for stroke, heart disease, and kidney failure.  For this reason, it's important to try to get your blood pressure as close as possible to the ideal.  The ideal blood pressure is 120/80.  Blood pressures from 621-308 systolic over 65-78 diastolic are labeled as "prehypertension."  This means you are at higher risk of developing hypertension in the future.  Blood pressures in this range are not treated with medication, but lifestyle changes are recommended to prevent progression to hypertension.  Blood  pressures of 361 and above systolic over 90 and above diastolic are classified as hypertension and are treated with medications.  Lifestyle changes which can benefit both prehypertension and hypertension include the following:   Salt and sodium restriction.  Weight loss.  Regular exercise.  Avoidance of  tobacco.  Avoidance of excess alcohol.  The "D.A.S.H" diet.   People with hypertension and prehypertension should limit their salt intake to less than 1500 mg daily.  Reading the nutrition information on the label of many prepared foods can give you an idea of how much sodium you're consuming at each meal.  Remember that the most important number on the nutrition information is the serving size.  It may be smaller than you think.  Try to avoid adding extra salt at the table.  You may add small amounts of salt while cooking.  Remember that salt is an acquired taste and you may get used to a using a whole lot less salt than you are using now.  Using less salt lets the food's natural flavors come through.  You might want to consider using salt substitutes, potassium chloride, pepper, or blends of herbs and spices to enhance the flavor of your food.  Foods that contain the most salt include: processed meats (like ham, bacon, lunch meat, sausage, hot dogs, and breakfast meat), chips, pretzels, salted nuts, soups, salty snacks, canned foods, junk food, fast food, restaurant food, mustard, pickles, pizza, popcorn, soy sauce, and worcestershire sauce--quite a list!  You might ask, "Is there anything I can eat?"  The answer is, "yes."  Fruits and vegetables are usually low in salt.  Fresh is better than frozen which is better than canned.  If you have canned vegetables, you can cut down on the salt content by rinsing them in tap water 3 times before cooking.     Weight loss is the second thing you can do to lower your blood pressure.  Getting to and maintaining ideal weight will often normalize your blood pressure and allow you to avoid medications, entirely, cut way down on your dosage of medications, or allow to wean off your meds.  (Note, this should only be done under the supervision of your primary care doctor.)  Of course, weight loss takes time and you may need to be on medication in the meantime.  You shoot  for a body mass index of 20-25.  When you go to the urgent care or to your primary care doctor, they should calculate your BMI.  If you don't know what it is, ask.  You can calculate your BMI with the following formula:  Weight in pounds x 703/ (height in inches) x (height in inches).  There are many good diets out there: Weight Watchers and the D.A.S.H. Diet are the best, but often, just modifying a few factors can be helpful:  Don't skip meals, don't eat out, and keeping a food diary.  I do not recommend fad diets or diet pills which often raise blood pressure.    Everyone should get regular exercise, but this is particularly important for people with high blood pressure.  Just about any exercise is good.  The only exercise which may be harmful is lifting extreme heavy weights.  I recommend moderate exercise such as walking for 30 minutes 5 days a week.  Going to the gym for a 50 minute workout 3 times a week is also good.  This amounts to 150 minutes of exercise weekly.   Anyone with  high blood pressure should avoid any use of tobacco.  Tobacco use does not elevate blood pressure, but it increases the risk of heart disease and stroke.  If you are interested in quitting, discuss with your doctor how to quit.  If you are not interested in quitting, ask yourself, "What would my life be like in 10 years if I continue to smoke?"  "How will I know when it is time to quit?"  "How would my life be better if I were to quit."   Excess alcohol intake can raise the blood pressure.  The safe alcohol intake is 2 drinks or less per day for men and 1 drink per day or less for women.   There is a very good diet which I recommend that has been designed for people with blood pressure called the D.A.S.H. Diet (dietary approaches to stop hypertension).  It consists of fruits, vegetables, lean meats, low fat dairy, whole grains, nuts and seeds.  It is very low in salt and sodium.  It has also been found to have other  beneficial health effects such as lowering cholesterol and helping lose weight.  It has been developed by the [employer] and can be downloaded from the internet without any cost. Just do a Development worker, community on "D.A.S.H. Diet." or go the NIH website (MasterBoxes.it).  There are also cookbooks and diet plans that can be gotten from [place] to help you with this diet.   Follow up with primary care for diabetes and high blood pressure as soon as possible.

## 2014-01-25 LAB — URINE CULTURE: Special Requests: NORMAL

## 2014-03-03 ENCOUNTER — Emergency Department (INDEPENDENT_AMBULATORY_CARE_PROVIDER_SITE_OTHER)
Admission: EM | Admit: 2014-03-03 | Discharge: 2014-03-03 | Disposition: A | Discharge status: Home / Self Care | Payer: Medicaid Other | Source: Home / Self Care | Attending: Family Medicine | Admitting: Family Medicine

## 2014-03-03 ENCOUNTER — Encounter (HOSPITAL_COMMUNITY): Payer: Self-pay | Admitting: Emergency Medicine

## 2014-03-03 DIAGNOSIS — K029 Dental caries, unspecified: Secondary | ICD-10-CM

## 2014-03-03 MED ORDER — CLINDAMYCIN HCL 300 MG PO CAPS: 300.0000 mg | ORAL_CAPSULE | Freq: Three times a day (TID) | ORAL | Status: DC

## 2014-03-03 MED ORDER — DICLOFENAC POTASSIUM 50 MG PO TABS: 50.0000 mg | ORAL_TABLET | Freq: Three times a day (TID) | ORAL | Status: DC

## 2014-03-03 NOTE — Discharge Instructions (Signed)
Take medicine as prescribed, see your dentist as soon as possible °

## 2014-03-03 NOTE — ED Notes (Addendum)
Facial  Pain r  Side  Face        With  Sinus  Congestion            Drainage     Symptoms  Began     Today   3 pm         Not  releived  By  [facility]  meds

## 2014-03-03 NOTE — ED Provider Notes (Signed)
CSN: [id number]     Arrival date & time 03/03/14  1725 History   First MD Initiated Contact with Patient 03/03/14 1804     Chief Complaint  Patient presents with  . Facial Pain   (Consider location/radiation/quality/duration/timing/severity/associated sxs/prior Treatment) Patient is a 38 y.o. female presenting with tooth pain. The history is provided by the patient.  Dental Pain Location:  Lower Lower teeth location:  31/RL 2nd molar Quality:  Throbbing Severity:  Mild Onset quality:  Sudden Duration:  3 hours Progression:  Unchanged Chronicity:  New Context: dental caries   Associated symptoms: facial pain   Associated symptoms: no facial swelling   Risk factors: no smoking     Past Medical History  Diagnosis Date  . Asthma   . Chlamydia   . Obesity   . Fatty liver   . Hx of shoulder dystocia, prior pregnancy, currently pregnant   . Pregnancy induced hypertension     diagnosed with pregnancy-started on aldomet-delivered 08/28/11  . Diabetes mellitus    Past Surgical History  Procedure Laterality Date  . Therapeutic abortion    . Wisdom tooth extraction    . Laparoscopic tubal ligation  10/29/2011    Procedure: LAPAROSCOPIC TUBAL LIGATION;  Surgeon: Mora Bellman, MD;  Location: [facility];  Service: Gynecology;  Laterality: Bilateral;  . Tubal ligation     Family History  Problem Relation Age of Onset  . Diabetes Mother   . Hypertension Mother   . Asthma Mother   . Diabetes Brother   . Hypertension Brother   . Asthma Brother   . Diabetes Father   . Cancer Father   . Asthma Father   . Hypertension Father    History  Substance Use Topics  . Smoking status: Never Smoker   . Smokeless tobacco: Never Used  . Alcohol Use: Yes     Comment: occasional   OB History   Grav Para Term Preterm Abortions TAB SAB Ect Mult Living   6 2 2  0 4 2 2  0 0 2     Review of Systems  Constitutional: Negative.   HENT: Positive for ear pain. Negative for facial swelling,  postnasal drip and rhinorrhea.   Respiratory: Negative.   Cardiovascular: Negative.     Allergies  Lisinopril  Home Medications   Current Outpatient Rx  Name  Route  Sig  Dispense  Refill  . cephALEXin (KEFLEX) 500 MG capsule   Oral   Take 1 capsule (500 mg total) by mouth 3 (three) times daily.   30 capsule   0   . clindamycin (CLEOCIN) 300 MG capsule   Oral   Take 1 capsule (300 mg total) by mouth 3 (three) times daily.   21 capsule   0   . diclofenac (CATAFLAM) 50 MG tablet   Oral   Take 1 tablet (50 mg total) by mouth 3 (three) times daily. For dental pain   21 tablet   0   . glipiZIDE (GLUCOTROL XL) 2.5 MG 24 hr tablet   Oral   Take 1 tablet (2.5 mg total) by mouth daily with breakfast.   30 tablet   2   . phenazopyridine (PYRIDIUM) 200 MG tablet   Oral   Take 1 tablet (200 mg total) by mouth 3 (three) times daily as needed for pain.   15 tablet   0   . traMADol (ULTRAM) 50 MG tablet   Oral   Take 1 tablet (50 mg total) by mouth every 6 (  six) hours as needed for pain.   15 tablet   0    LMP 03/01/2014 Physical Exam  Nursing note and vitals reviewed. Constitutional: She is oriented to person, place, and time. She appears well-developed and well-nourished. No distress.  HENT:  Head: Normocephalic.  Right Ear: External ear normal.  Left Ear: External ear normal.  Mouth/Throat: Uvula is midline and mucous membranes are normal. Abnormal dentition. No uvula swelling.    Eyes: Conjunctivae are normal. Pupils are equal, round, and reactive to light.  Neck: Normal range of motion. Neck supple.  Lymphadenopathy:    She has no cervical adenopathy.  Neurological: She is alert and oriented to person, place, and time.  Skin: Skin is warm and dry.    ED Course  Procedures (including critical care time) Labs Review Labs Reviewed - No data to display Imaging Review No results found.   MDM   1. Pain due to dental caries        Billy Fischer,  MD 03/03/14 [phone]

## 2014-10-11 LAB — HM DIABETES EYE EXAM

## 2014-10-22 ENCOUNTER — Encounter (HOSPITAL_COMMUNITY): Payer: Self-pay | Admitting: Emergency Medicine

## 2014-10-29 ENCOUNTER — Ambulatory Visit (INDEPENDENT_AMBULATORY_CARE_PROVIDER_SITE_OTHER): Payer: Medicaid Other | Admitting: Family Medicine

## 2014-10-29 ENCOUNTER — Other Ambulatory Visit (HOSPITAL_COMMUNITY)
Admission: RE | Admit: 2014-10-29 | Discharge: 2014-10-29 | Disposition: A | Discharge status: Home / Self Care | Payer: 59 | Source: Ambulatory Visit | Attending: Family Medicine | Admitting: Family Medicine

## 2014-10-29 ENCOUNTER — Encounter: Payer: Self-pay | Admitting: Family Medicine

## 2014-10-29 VITALS — BP 152/98 | HR 108 | Temp 98.6°F | Ht 63.0 in | Wt 315.0 lb

## 2014-10-29 DIAGNOSIS — Z0000 Encounter for general adult medical examination without abnormal findings: Secondary | ICD-10-CM

## 2014-10-29 DIAGNOSIS — R197 Diarrhea, unspecified: Secondary | ICD-10-CM | POA: Insufficient documentation

## 2014-10-29 DIAGNOSIS — Z124 Encounter for screening for malignant neoplasm of cervix: Secondary | ICD-10-CM

## 2014-10-29 DIAGNOSIS — O165 Unspecified maternal hypertension, complicating the puerperium: Secondary | ICD-10-CM

## 2014-10-29 DIAGNOSIS — E119 Type 2 diabetes mellitus without complications: Secondary | ICD-10-CM

## 2014-10-29 DIAGNOSIS — Z1151 Encounter for screening for human papillomavirus (HPV): Secondary | ICD-10-CM | POA: Insufficient documentation

## 2014-10-29 DIAGNOSIS — R0982 Postnasal drip: Secondary | ICD-10-CM

## 2014-10-29 DIAGNOSIS — I10 Essential (primary) hypertension: Secondary | ICD-10-CM

## 2014-10-29 DIAGNOSIS — M25562 Pain in left knee: Secondary | ICD-10-CM

## 2014-10-29 DIAGNOSIS — Z01419 Encounter for gynecological examination (general) (routine) without abnormal findings: Secondary | ICD-10-CM | POA: Diagnosis present

## 2014-10-29 DIAGNOSIS — Z113 Encounter for screening for infections with a predominantly sexual mode of transmission: Secondary | ICD-10-CM | POA: Diagnosis not present

## 2014-10-29 DIAGNOSIS — M25569 Pain in unspecified knee: Secondary | ICD-10-CM | POA: Insufficient documentation

## 2014-10-29 DIAGNOSIS — M25561 Pain in right knee: Secondary | ICD-10-CM

## 2014-10-29 DIAGNOSIS — O169 Unspecified maternal hypertension, unspecified trimester: Secondary | ICD-10-CM

## 2014-10-29 LAB — POCT GLYCOSYLATED HEMOGLOBIN (HGB A1C): Hemoglobin A1C: 10.4

## 2014-10-29 LAB — POCT WET PREP (WET MOUNT): CLUE CELLS WET PREP WHIFF POC: NEGATIVE

## 2014-10-29 MED ORDER — LOSARTAN POTASSIUM-HCTZ 100-25 MG PO TABS: 1.0000 | ORAL_TABLET | Freq: Every day | ORAL | Status: DC

## 2014-10-29 MED ORDER — FLUTICASONE PROPIONATE 50 MCG/ACT NA SUSP: 2.0000 | Freq: Every day | NASAL | Status: DC

## 2014-10-29 NOTE — Progress Notes (Signed)
Subjective:     Patient ID: Anna Hawkins, female   DOB: 1976/10/27, 38 y.o.   MRN: [id number]  HPI Anna Hawkins is a 38yo female presenting today for annual physical exam.  She has not been to doctor since 2012 and has not received any of her medications since then. Was previously treated for hypertension and diabetes. States she didn't know she had medicaid, but that she recently found out and has been trying to get hooked back in to her doctors. She went to ophthalmology in past few weeks. Would like pap smear today.  Acute complaints include cough, diarrhea, and occasional knee pain. - Cough has been present for 62months. Worse at night. Feels like something is draining down the back of her throat. Improved by humidifier and allergy medications, such as Zyrtec. - Diarrhea has been present intermittently for approximately 13months as well. Seems to be caused by certain foods, such as red meat. - Left and right knee both hurt intermittently. Has tried Tylenol arthritis which seems to help. No pain acutely today.  Review of Systems  Constitutional: Negative for fever.  HENT: Positive for postnasal drip. Negative for congestion.   Respiratory: Positive for cough. Negative for shortness of breath.   Cardiovascular: Positive for leg swelling.  Gastrointestinal: Positive for diarrhea.  Musculoskeletal: Positive for arthralgias.       Objective:   Physical Exam  Constitutional: She is oriented to person, place, and time. She appears well-developed and well-nourished. No distress.  HENT:  Head: Normocephalic and atraumatic.  Mouth/Throat: Oropharynx is clear and moist. No oropharyngeal exudate.  Cardiovascular: Normal rate, regular rhythm, normal heart sounds and intact distal pulses.  Exam reveals no gallop and no friction rub.   No murmur heard. Pulmonary/Chest: Effort normal and breath sounds normal. No respiratory distress. She has no wheezes. She has no rales.  Abdominal: Soft. Bowel  sounds are normal. She exhibits no distension and no mass. There is no tenderness.  Genitourinary: Vagina normal. There is no rash or lesion on the right labia. There is no rash or lesion on the left labia. Cervix exhibits no motion tenderness and no friability. Right adnexum displays no mass and no tenderness. Left adnexum displays no mass and no tenderness. No tenderness in the vagina.  Musculoskeletal: She exhibits edema.  Neurological: She is alert and oriented to person, place, and time.  Skin: Skin is warm. No rash noted.  Psychiatric: She has a normal mood and affect. Her behavior is normal.      Assessment:     Please refer to Problem List for Assessment.     Plan:     Please refer to Problem List for Plan.

## 2014-10-29 NOTE — Assessment & Plan Note (Signed)
-   Recommend food diary and avoidance of foods that exacerbate diarrhea

## 2014-10-29 NOTE — Assessment & Plan Note (Signed)
-   Will prescribe Flonase today. Discussed proper use. - Continue use of Zyrtec for allergies

## 2014-10-29 NOTE — Assessment & Plan Note (Addendum)
-   Will check A1C today and consider re-starting medications based on findings  - A1C 10.4, indicating need for insulin management - Will refer to diabetes classes.  - Was instructed to take Glipizide 2.5mg  on 01/24/14, however did not comply with this - States she had adverse effect with metformin, however no record of metformin is found in list of medications

## 2014-10-29 NOTE — Patient Instructions (Signed)
Thank you so much for coming to visit [place] today! I will let you know if anything abnormal shows up on your pap smear. I have restarted your blood pressure medicine. I have sent in a prescription for Flonase. This should help with your cough and allergies. You may try Aspercreme for your knees. Please keep a food log to keep track of what foods are causing your diarrhea. I am getting several labs today. We will wait to start diabetes medications depending on what these labs show.  Please follow up in 2 weeks so we can check on your blood pressure and talk about starting a medicine for your diabetes!  Thanks again! Dr. Gerlean Ren

## 2014-10-29 NOTE — Assessment & Plan Note (Signed)
-   Recommend over the counter Aspercreme.

## 2014-10-29 NOTE — Assessment & Plan Note (Addendum)
-   Initially 185/105. Trended down to 152/98. - Will restart Losartan-HCTZ 100-25mg  today  - Allergy to Lisinopril noted. Instructed to call office if she experiences any reaction to Losartan. Has tolerated in past, so don't anticipate reaction. - Call if episodes of hypotension occurs - Will check lipid panel and BMP today. Will calculate ASCVD risk and consider statin pending results. - Return in 1-2 weeks to re-check blood pressure with medication

## 2014-10-30 LAB — BASIC METABOLIC PANEL
BUN: 10 mg/dL (ref 6–23)
CALCIUM: 9.3 mg/dL (ref 8.4–10.5)
CO2: 26 mEq/L (ref 19–32)
Chloride: 104 mEq/L (ref 96–112)
Creat: 0.68 mg/dL (ref 0.50–1.10)
Glucose, Bld: 220 mg/dL — ABNORMAL HIGH (ref 70–99)
POTASSIUM: 3.9 meq/L (ref 3.5–5.3)
SODIUM: 138 meq/L (ref 135–145)

## 2014-10-30 LAB — LIPID PANEL
CHOLESTEROL: 163 mg/dL (ref 0–200)
HDL: 60 mg/dL (ref >39)
LDL Cholesterol: 79 mg/dL (ref 0–99)
TRIGLYCERIDES: 122 mg/dL (ref <150)
Total CHOL/HDL Ratio: 2.7 Ratio
VLDL: 24 mg/dL (ref 0–40)

## 2014-10-30 LAB — CBC
HCT: 35 % — ABNORMAL LOW (ref 36.0–46.0)
Hemoglobin: 11.4 g/dL — ABNORMAL LOW (ref 12.0–15.0)
MCH: 22.3 pg — AB (ref 26.0–34.0)
MCHC: 32.6 g/dL (ref 30.0–36.0)
MCV: 68.5 fL — ABNORMAL LOW (ref 78.0–100.0)
Platelets: 358 10*3/uL (ref 150–400)
RBC: 5.11 MIL/uL (ref 3.87–5.11)
RDW: 15.4 % (ref 11.5–15.5)
WBC: 9.3 10*3/uL (ref 4.0–10.5)

## 2014-10-30 LAB — CYTOLOGY - PAP

## 2014-10-31 LAB — CERVICOVAGINAL ANCILLARY ONLY
Chlamydia: NEGATIVE
Neisseria Gonorrhea: NEGATIVE

## 2014-11-03 ENCOUNTER — Encounter: Payer: Self-pay | Admitting: Family Medicine

## 2014-11-03 DIAGNOSIS — Z0000 Encounter for general adult medical examination without abnormal findings: Secondary | ICD-10-CM | POA: Insufficient documentation

## 2014-11-03 MED ORDER — GLIPIZIDE ER 5 MG PO TB24: 5.0000 mg | ORAL_TABLET | Freq: Every day | ORAL | Status: DC

## 2014-11-03 NOTE — Addendum Note (Signed)
Addended by: Lorna Few on: 11/03/2014 07:29 PM   Modules accepted: Orders, SmartSet

## 2014-11-06 ENCOUNTER — Emergency Department (HOSPITAL_COMMUNITY): Payer: 59

## 2014-11-06 ENCOUNTER — Encounter: Payer: Self-pay | Admitting: Family Medicine

## 2014-11-06 ENCOUNTER — Telehealth: Payer: Self-pay | Admitting: Family Medicine

## 2014-11-06 ENCOUNTER — Encounter (HOSPITAL_COMMUNITY): Payer: Self-pay | Admitting: Emergency Medicine

## 2014-11-06 ENCOUNTER — Emergency Department (HOSPITAL_COMMUNITY)
Admission: EM | Admit: 2014-11-06 | Discharge: 2014-11-06 | Disposition: A | Discharge status: Home / Self Care | Payer: 59 | Attending: Emergency Medicine | Admitting: Emergency Medicine

## 2014-11-06 DIAGNOSIS — Z791 Long term (current) use of non-steroidal anti-inflammatories (NSAID): Secondary | ICD-10-CM | POA: Diagnosis not present

## 2014-11-06 DIAGNOSIS — E669 Obesity, unspecified: Secondary | ICD-10-CM | POA: Diagnosis not present

## 2014-11-06 DIAGNOSIS — Z792 Long term (current) use of antibiotics: Secondary | ICD-10-CM | POA: Insufficient documentation

## 2014-11-06 DIAGNOSIS — Z8719 Personal history of other diseases of the digestive system: Secondary | ICD-10-CM | POA: Insufficient documentation

## 2014-11-06 DIAGNOSIS — J45909 Unspecified asthma, uncomplicated: Secondary | ICD-10-CM | POA: Insufficient documentation

## 2014-11-06 DIAGNOSIS — Z3202 Encounter for pregnancy test, result negative: Secondary | ICD-10-CM | POA: Insufficient documentation

## 2014-11-06 DIAGNOSIS — Z8619 Personal history of other infectious and parasitic diseases: Secondary | ICD-10-CM | POA: Diagnosis not present

## 2014-11-06 DIAGNOSIS — N23 Unspecified renal colic: Secondary | ICD-10-CM

## 2014-11-06 DIAGNOSIS — E1165 Type 2 diabetes mellitus with hyperglycemia: Secondary | ICD-10-CM | POA: Diagnosis not present

## 2014-11-06 DIAGNOSIS — R1011 Right upper quadrant pain: Secondary | ICD-10-CM

## 2014-11-06 DIAGNOSIS — Z79899 Other long term (current) drug therapy: Secondary | ICD-10-CM | POA: Diagnosis not present

## 2014-11-06 DIAGNOSIS — Z9851 Tubal ligation status: Secondary | ICD-10-CM | POA: Insufficient documentation

## 2014-11-06 DIAGNOSIS — R109 Unspecified abdominal pain: Secondary | ICD-10-CM

## 2014-11-06 DIAGNOSIS — R739 Hyperglycemia, unspecified: Secondary | ICD-10-CM

## 2014-11-06 DIAGNOSIS — Z7951 Long term (current) use of inhaled steroids: Secondary | ICD-10-CM | POA: Diagnosis not present

## 2014-11-06 LAB — URINALYSIS, ROUTINE W REFLEX MICROSCOPIC
Bilirubin Urine: NEGATIVE
Glucose, UA: >1000 mg/dL — AB
Ketones, ur: NEGATIVE mg/dL
NITRITE: NEGATIVE
PROTEIN: NEGATIVE mg/dL
Specific Gravity, Urine: 1.027 (ref 1.005–1.030)
UROBILINOGEN UA: 0.2 mg/dL (ref 0.0–1.0)
pH: 5.5 (ref 5.0–8.0)

## 2014-11-06 LAB — COMPREHENSIVE METABOLIC PANEL
ALBUMIN: 3.6 g/dL (ref 3.5–5.2)
ALT: 16 U/L (ref 0–35)
ANION GAP: 16 — AB (ref 5–15)
AST: 16 U/L (ref 0–37)
Alkaline Phosphatase: 115 U/L (ref 39–117)
BUN: 16 mg/dL (ref 6–23)
CALCIUM: 9.6 mg/dL (ref 8.4–10.5)
CO2: 24 mEq/L (ref 19–32)
CREATININE: 0.68 mg/dL (ref 0.50–1.10)
Chloride: 95 mEq/L — ABNORMAL LOW (ref 96–112)
GFR calc Af Amer: >90 mL/min (ref >90)
GFR calc non Af Amer: >90 mL/min (ref >90)
Glucose, Bld: 395 mg/dL — ABNORMAL HIGH (ref 70–99)
Potassium: 3.9 mEq/L (ref 3.7–5.3)
Sodium: 135 mEq/L — ABNORMAL LOW (ref 137–147)
Total Bilirubin: 0.9 mg/dL (ref 0.3–1.2)
Total Protein: 7.8 g/dL (ref 6.0–8.3)

## 2014-11-06 LAB — POC URINE PREG, ED: PREG TEST UR: NEGATIVE

## 2014-11-06 LAB — URINE MICROSCOPIC-ADD ON

## 2014-11-06 LAB — CBC WITH DIFFERENTIAL/PLATELET
BASOS ABS: 0 10*3/uL (ref 0.0–0.1)
Basophils Relative: 0 % (ref 0–1)
Eosinophils Absolute: 0.2 10*3/uL (ref 0.0–0.7)
Eosinophils Relative: 2 % (ref 0–5)
HCT: 36.8 % (ref 36.0–46.0)
Hemoglobin: 11.7 g/dL — ABNORMAL LOW (ref 12.0–15.0)
LYMPHS PCT: 26 % (ref 12–46)
Lymphs Abs: 2.6 10*3/uL (ref 0.7–4.0)
MCH: 22.6 pg — ABNORMAL LOW (ref 26.0–34.0)
MCHC: 31.8 g/dL (ref 30.0–36.0)
MCV: 71 fL — ABNORMAL LOW (ref 78.0–100.0)
MONOS PCT: 6 % (ref 3–12)
Monocytes Absolute: 0.6 10*3/uL (ref 0.1–1.0)
NEUTROS PCT: 66 % (ref 43–77)
Neutro Abs: 6.6 10*3/uL (ref 1.7–7.7)
PLATELETS: 286 10*3/uL (ref 150–400)
RBC: 5.18 MIL/uL — ABNORMAL HIGH (ref 3.87–5.11)
RDW: 13.6 % (ref 11.5–15.5)
WBC: 10 10*3/uL (ref 4.0–10.5)

## 2014-11-06 LAB — LIPASE, BLOOD: Lipase: 32 U/L (ref 11–59)

## 2014-11-06 MED ORDER — ONDANSETRON 4 MG PO TBDP: ORAL_TABLET | ORAL | Status: DC

## 2014-11-06 MED ORDER — MORPHINE SULFATE 4 MG/ML IJ SOLN
4.0000 mg | Freq: Once | INTRAMUSCULAR | Status: AC
  Administered 2014-11-06: 4 mg via INTRAVENOUS
  Filled 2014-11-06: qty 1

## 2014-11-06 MED ORDER — INSULIN ASPART 100 UNIT/ML IV SOLN
10.0000 [IU] | Freq: Once | INTRAVENOUS | Status: AC
  Administered 2014-11-06: 10 [IU] via INTRAVENOUS
  Filled 2014-11-06: qty 0.1

## 2014-11-06 MED ORDER — KETOROLAC TROMETHAMINE 30 MG/ML IJ SOLN
30.0000 mg | Freq: Once | INTRAMUSCULAR | Status: AC
  Administered 2014-11-06: 30 mg via INTRAVENOUS
  Filled 2014-11-06: qty 1

## 2014-11-06 MED ORDER — ONDANSETRON HCL 4 MG/2ML IJ SOLN
4.0000 mg | Freq: Once | INTRAMUSCULAR | Status: AC
  Administered 2014-11-06: 4 mg via INTRAVENOUS
  Filled 2014-11-06: qty 2

## 2014-11-06 MED ORDER — KETOROLAC TROMETHAMINE 10 MG PO TABS: 10.0000 mg | ORAL_TABLET | Freq: Four times a day (QID) | ORAL | Status: DC | PRN

## 2014-11-06 MED ORDER — SODIUM CHLORIDE 0.9 % IV BOLUS (SEPSIS)
1000.0000 mL | Freq: Once | INTRAVENOUS | Status: AC
  Administered 2014-11-06: 1000 mL via INTRAVENOUS

## 2014-11-06 MED ORDER — HYDROCODONE-ACETAMINOPHEN 5-325 MG PO TABS: 1.0000 | ORAL_TABLET | ORAL | Status: DC | PRN

## 2014-11-06 NOTE — Telephone Encounter (Signed)
Attempted to call x2 with no response. Please call to let her know that her A1C came back high at 10.4. I have sent in a prescription for Glipizide and it is important that she take it to lower her blood sugar. Please ask her what her reaction to Metformin was and how long she tried it. We may try restarting it.  Please have her schedule an appointment at the clinic in 3-4 weeks so we can see how she is doing on the medications and recheck her blood sugar.  Please also let her know that her pap smear and other lab tests were normal.  Thanks!

## 2014-11-06 NOTE — ED Provider Notes (Signed)
CSN: [id number]     Arrival date & time 11/06/14  0354 History   First MD Initiated Contact with Patient 11/06/14 0435     Chief Complaint  Patient presents with  . Abdominal Pain  . Emesis     (Consider location/radiation/quality/duration/timing/severity/associated sxs/prior Treatment) HPI Patient presents with acute onset right upper quadrant pain radiating around to the right flank starting around midnight waking her from sleep. Associated with multiple episodes of vomiting. No diarrhea. No fever or chills. No previously similar symptoms. Patient denies any dysuria, hematuria, urgency. States was recently started on a diuretic so she has had increased frequency. Past Medical History  Diagnosis Date  . Asthma   . Chlamydia   . Obesity   . Fatty liver   . Hx of shoulder dystocia, prior pregnancy, currently pregnant   . Pregnancy induced hypertension     diagnosed with pregnancy-started on aldomet-delivered 08/28/11  . Diabetes mellitus    Past Surgical History  Procedure Laterality Date  . Therapeutic abortion    . Wisdom tooth extraction    . Laparoscopic tubal ligation  10/29/2011    Procedure: LAPAROSCOPIC TUBAL LIGATION;  Surgeon: Mora Bellman, MD;  Location: [facility];  Service: Gynecology;  Laterality: Bilateral;  . Tubal ligation     Family History  Problem Relation Age of Onset  . Diabetes Mother   . Hypertension Mother   . Asthma Mother   . Diabetes Brother   . Hypertension Brother   . Asthma Brother   . Diabetes Father   . Cancer Father   . Asthma Father   . Hypertension Father    History  Substance Use Topics  . Smoking status: Never Smoker   . Smokeless tobacco: Never Used  . Alcohol Use: Yes     Comment: occasional   OB History    Gravida Para Term Preterm AB TAB SAB Ectopic Multiple Living   6 2 2  0 4 2 2  0 0 2     Review of Systems  Constitutional: Negative for fever and chills.  Respiratory: Negative for shortness of breath.   Cardiovascular:  Negative for chest pain.  Gastrointestinal: Positive for nausea, vomiting and abdominal pain. Negative for diarrhea and constipation.  Genitourinary: Positive for frequency and flank pain. Negative for dysuria, hematuria, difficulty urinating and pelvic pain.  Musculoskeletal: Positive for back pain. Negative for neck pain and neck stiffness.  Skin: Negative for rash and wound.  Neurological: Negative for dizziness, weakness, light-headedness, numbness and headaches.  All other systems reviewed and are negative.     Allergies  Lisinopril  Home Medications   Prior to Admission medications   Medication Sig Start Date End Date Taking? Authorizing Provider  fluticasone (FLONASE) 50 MCG/ACT nasal spray Place 2 sprays into both nostrils daily. 10/29/14  Yes Lecanto N Rumley, DO  losartan-hydrochlorothiazide (HYZAAR) 100-25 MG per tablet Take 1 tablet by mouth daily. 10/29/14 10/29/15 Yes [place] N Rumley, DO  cephALEXin (KEFLEX) 500 MG capsule Take 1 capsule (500 mg total) by mouth 3 (three) times daily. 01/24/14   Harden Mo, MD  clindamycin (CLEOCIN) 300 MG capsule Take 1 capsule (300 mg total) by mouth 3 (three) times daily. 03/03/14   Billy Fischer, MD  diclofenac (CATAFLAM) 50 MG tablet Take 1 tablet (50 mg total) by mouth 3 (three) times daily. For dental pain 03/03/14   Billy Fischer, MD  glipiZIDE (GLUCOTROL XL) 5 MG 24 hr tablet Take 1 tablet (5 mg total) by mouth daily  with breakfast. 11/03/14   Burna Cash Rumley, DO  phenazopyridine (PYRIDIUM) 200 MG tablet Take 1 tablet (200 mg total) by mouth 3 (three) times daily as needed for pain. 01/24/14   Harden Mo, MD  traMADol (ULTRAM) 50 MG tablet Take 1 tablet (50 mg total) by mouth every 6 (six) hours as needed for pain. 07/18/13   Kaitlyn Szekalski, PA-C   BP 131/76 mmHg  Pulse 103  Temp(Src) 97.7 F (36.5 C) (Oral)  Resp 20  SpO2 99%  LMP 10/21/2014 (Approximate) Physical Exam  Constitutional: She is oriented to person, place, and  time. She appears well-developed and well-nourished. No distress.  HENT:  Head: Normocephalic and atraumatic.  Mouth/Throat: Oropharynx is clear and moist.  Eyes: EOM are normal. Pupils are equal, round, and reactive to light.  Neck: Normal range of motion. Neck supple.  Cardiovascular: Normal rate and regular rhythm.   Pulmonary/Chest: Effort normal and breath sounds normal. No respiratory distress. She has no wheezes. She has no rales.  Abdominal: Soft. Bowel sounds are normal. She exhibits no distension and no mass. There is tenderness (tenderness to palpation in the right upper quadrant.no rebound or guarding.). There is no rebound and no guarding.  Musculoskeletal: Normal range of motion. She exhibits no edema or tenderness.  No CVA tenderness bilaterally.  Neurological: She is alert and oriented to person, place, and time.  Skin: Skin is warm and dry. No rash noted. No erythema.  Psychiatric: She has a normal mood and affect. Her behavior is normal.  Nursing note and vitals reviewed.   ED Course  Procedures (including critical care time) Labs Review Labs Reviewed  CBC WITH DIFFERENTIAL - Abnormal; Notable for the following:    RBC 5.18 (*)    Hemoglobin 11.7 (*)    MCV 71.0 (*)    MCH 22.6 (*)    All other components within normal limits  URINALYSIS, ROUTINE W REFLEX MICROSCOPIC  LIPASE, BLOOD  COMPREHENSIVE METABOLIC PANEL  POC URINE PREG, ED    Imaging Review No results found.   EKG Interpretation None      MDM   Final diagnoses:  RUQ pain      Patient's pain and vomiting is resolved completely. Ultrasound without any evidence of gallstones. Microscopic hematuria. Suspect renal stone. Will get CT abdomen without contrast. Anticipate discharge home.  Signed out to oncoming emergency physician pending CT abdomen and pelvis.  Julianne Rice, MD 11/10/14 [phone]

## 2014-11-06 NOTE — Discharge Instructions (Signed)
High Blood Sugar High blood sugar (hyperglycemia) means that the level of sugar in your blood is higher than it should be. Signs of high blood sugar include:  Feeling thirsty.  Frequent peeing (urinating).  Feeling tired or sleepy.  Dry mouth.  Vision changes.  Feeling weak.  Feeling hungry but losing weight.  Numbness and tingling in your hands or feet.  Headache. When you ignore these signs, your blood sugar may keep going up. These problems may get worse, and other problems may begin. HOME CARE  Check your blood sugars as told by your doctor. Write down the numbers with the date and time.  Take the right amount of insulin or diabetes pills at the right time. Write down the dose with date and time.  Refill your insulin or diabetes pills before running out.  Watch what you eat. Follow your meal plan.  Drink liquids without sugar, such as water. Check with your doctor if you have kidney or heart disease.  Follow your doctor's orders for exercise. Exercise at the same time of day.  Keep your doctor's appointments. GET HELP RIGHT AWAY IF:   You have trouble thinking or are confused.  You have fast breathing with fruity smelling breath.  You pass out (faint).  You have 2 to 3 days of high blood sugars and you do not know why.  You have chest pain.  You are feeling sick to your stomach (nauseous) or throwing up (vomiting).  You have sudden vision changes. MAKE SURE YOU:   Understand these instructions.  Will watch your condition.  Will get help right away if you are not doing well or get worse. Document Released: 10/04/2009 Document Revised: 02/29/2012 Document Reviewed: 10/04/2009 [facility] Patient Information 2015 [place], [place]. This information is not intended to replace advice given to you by your health care provider. Make sure you discuss any questions you have with your health care provider.  Kidney Stones Kidney stones (urolithiasis) are deposits  that form inside your kidneys. The intense pain is caused by the stone moving through the urinary tract. When the stone moves, the ureter goes into spasm around the stone. The stone is usually passed in the urine.  CAUSES   A disorder that makes certain neck glands produce too much parathyroid hormone (primary hyperparathyroidism).  A buildup of uric acid crystals, similar to gout in your joints.  Narrowing (stricture) of the ureter.  A kidney obstruction present at birth (congenital obstruction).  Previous surgery on the kidney or ureters.  Numerous kidney infections. SYMPTOMS   Feeling sick to your stomach (nauseous).  Throwing up (vomiting).  Blood in the urine (hematuria).  Pain that usually spreads (radiates) to the groin.  Frequency or urgency of urination. DIAGNOSIS   Taking a history and physical exam.  Blood or urine tests.  CT scan.  Occasionally, an examination of the inside of the urinary bladder (cystoscopy) is performed. TREATMENT   Observation.  Increasing your fluid intake.  Extracorporeal shock wave lithotripsy--This is a noninvasive procedure that uses shock waves to break up kidney stones.  Surgery may be needed if you have severe pain or persistent obstruction. There are various surgical procedures. Most of the procedures are performed with the use of small instruments. Only small incisions are needed to accommodate these instruments, so recovery time is minimized. The size, location, and chemical composition are all important variables that will determine the proper choice of action for you. Talk to your health care provider to better understand your  situation so that you will minimize the risk of injury to yourself and your kidney.  HOME CARE INSTRUCTIONS   Drink enough water and fluids to keep your urine clear or pale yellow. This will help you to pass the stone or stone fragments.  Strain all urine through the provided strainer. Keep all  particulate matter and stones for your health care provider to see. The stone causing the pain may be as small as a grain of salt. It is very important to use the strainer each and every time you pass your urine. The collection of your stone will allow your health care provider to analyze it and verify that a stone has actually passed. The stone analysis will often identify what you can do to reduce the incidence of recurrences.  Only take over-the-counter or prescription medicines for pain, discomfort, or fever as directed by your health care provider.  Make a follow-up appointment with your health care provider as directed.  Get follow-up X-rays if required. The absence of pain does not always mean that the stone has passed. It may have only stopped moving. If the urine remains completely obstructed, it can cause loss of kidney function or even complete destruction of the kidney. It is your responsibility to make sure X-rays and follow-ups are completed. Ultrasounds of the kidney can show blockages and the status of the kidney. Ultrasounds are not associated with any radiation and can be performed easily in a matter of minutes. SEEK MEDICAL CARE IF:  You experience pain that is progressive and unresponsive to any pain medicine you have been prescribed. SEEK IMMEDIATE MEDICAL CARE IF:   Pain cannot be controlled with the prescribed medicine.  You have a fever or shaking chills.  The severity or intensity of pain increases over 18 hours and is not relieved by pain medicine.  You develop a new onset of abdominal pain.  You feel faint or pass out.  You are unable to urinate. MAKE SURE YOU:   Understand these instructions.  Will watch your condition.  Will get help right away if you are not doing well or get worse. Document Released: 12/07/2005 Document Revised: 08/09/2013 Document Reviewed: 05/10/2013 [facility] Patient Information 2015 [place], [place]. This information is not intended to  replace advice given to you by your health care provider. Make sure you discuss any questions you have with your health care provider.

## 2014-11-06 NOTE — ED Notes (Signed)
Ultrasound at bedside

## 2014-11-06 NOTE — ED Provider Notes (Signed)
Care's in from Dr. Lita Mains at shift change. Patient awaiting a CT scan to evaluate for suspected renal calculus. The scan does reveal what appears to be acute on chronic hydronephrosis with a 2 mm stone at the UPJ. She will be treated with pain meds and follow-up with urology.  Veryl Speak, MD 11/06/14 [phone]

## 2014-11-06 NOTE — ED Notes (Signed)
Pt states she is having pain on her right side and emesis  Pt states her sxs started about midnight tonight  Pt states the pain is more in the flank area

## 2014-11-06 NOTE — ED Notes (Signed)
Patient transported to CT 

## 2014-11-06 NOTE — ED Notes (Signed)
Dr Yelverton at bedside.  

## 2014-11-06 NOTE — ED Notes (Signed)
Patient reports she was not feeling well yesterday and went to bed around 6:00pm.  She awoke at midnight with severe abdominal pain and nausea.  She estimates that she's vomited 5 times since awakening.

## 2014-11-07 ENCOUNTER — Telehealth: Payer: Self-pay | Admitting: Family Medicine

## 2014-11-07 NOTE — Telephone Encounter (Signed)
Pt called back because she needs a glucose meter and all the testing supplies that go with it. jw

## 2014-11-07 NOTE — Telephone Encounter (Signed)
LVM for patient to call back. ?

## 2014-11-07 NOTE — Telephone Encounter (Signed)
Forward to patient's PCP for Rx.Busick, Kevin Fenton

## 2014-11-19 ENCOUNTER — Telehealth: Payer: Self-pay | Admitting: *Deleted

## 2014-11-19 ENCOUNTER — Other Ambulatory Visit: Payer: Self-pay | Admitting: Family Medicine

## 2014-11-19 MED ORDER — GLUCOSE BLOOD VI STRP: ORAL_STRIP | Status: DC

## 2014-11-19 MED ORDER — ACCU-CHEK AVIVA DEVI: Status: DC

## 2014-11-19 MED ORDER — ACCU-CHEK MULTICLIX LANCETS MISC: Status: DC

## 2014-11-19 NOTE — Telephone Encounter (Signed)
Pt needs a refill on Accucheck Aviva plus test strips. Fleeger, Salome Spotted

## 2014-11-27 ENCOUNTER — Ambulatory Visit (INDEPENDENT_AMBULATORY_CARE_PROVIDER_SITE_OTHER): Payer: 59 | Admitting: *Deleted

## 2014-11-27 ENCOUNTER — Ambulatory Visit (INDEPENDENT_AMBULATORY_CARE_PROVIDER_SITE_OTHER): Payer: 59 | Admitting: Family Medicine

## 2014-11-27 ENCOUNTER — Encounter: Payer: Self-pay | Admitting: Family Medicine

## 2014-11-27 VITALS — BP 142/88 | HR 101 | Temp 98.3°F | Ht 63.0 in | Wt 311.5 lb

## 2014-11-27 DIAGNOSIS — Z23 Encounter for immunization: Secondary | ICD-10-CM

## 2014-11-27 DIAGNOSIS — E119 Type 2 diabetes mellitus without complications: Secondary | ICD-10-CM

## 2014-11-27 DIAGNOSIS — I10 Essential (primary) hypertension: Secondary | ICD-10-CM

## 2014-11-27 MED ORDER — GLIPIZIDE ER 10 MG PO TB24: 10.0000 mg | ORAL_TABLET | Freq: Every day | ORAL | Status: DC

## 2014-11-27 NOTE — Assessment & Plan Note (Addendum)
-   A1C 10.4 on 10/29/14 - Has been taking Glipizide 5mg  in the morning. Will increase dose to Glipizide 10mg  - She has been to Diabetes Education classes twice. She does not feel that these will help her. - She has been monitoring her calories - Denies Pneumonia Vaccine at this visit. States she will reconsider it at next visit. - Discussed symptoms of hypoglycemia and instructed to call clinic if these symptoms occur. Given handout on hypoglycemia. - Follow up in 2 months - Unsure of cause of sweet taste in mouth. Will research possible etiologies and discuss with Mrs. Hankinson at next visit.

## 2014-11-27 NOTE — Progress Notes (Signed)
Subjective:     Patient ID: Anna Hawkins, female   DOB: 06-17-1976, 38 y.o.   MRN: [id number]  HPI Anna Hawkins is a 38yo female presenting today for follow up on HTN and Diabetes.  # HTN: - Initial BP of 185/105 at last visit on 10/29/14. Trended down to 152/98. - Losartan-HCTZ 100-25mg  restarted at last visit. Has been taking and has noted no adverse effects since starting.   # Diabetes: - A1C 10.4 at last visit on 10/29/14 - Restarted Glipizide 5mg . States she takes this at 7:30 every morning. - She has not noted any adverse effects of Glipizide - Notes that she has tried Metformin in the past and has ended up in the hospital with severe diarrhea and vomiting resulting in dehydration each time she has tried it. - Has been keeping detailed logs on her phone using AccuCheck 360 app  - Log for past three days shows range of 145-242 with average glucose of 179 - Notes that she occasionally wakes up in a sweat with glucose of mid 200s.  - Notes that she has had a sweet taste in her mouth since her last visit. Taste is constant. Makes it difficult for her to eat sweet foods. - Denies polydipsia or polyuria  Review of Systems  Gastrointestinal: Positive for abdominal pain.       Secondary to suspected kidney stone  All other systems reviewed and are negative.      Objective:   Physical Exam  Constitutional: She is oriented to person, place, and time. She appears well-developed and well-nourished. No distress.  Cardiovascular: Normal rate, regular rhythm and normal heart sounds.  Exam reveals no gallop and no friction rub.   No murmur heard. Pulmonary/Chest: Effort normal and breath sounds normal. No respiratory distress. She has no wheezes. She has no rales.  Abdominal: Soft. Bowel sounds are normal. She exhibits no distension. There is no tenderness.  Musculoskeletal: She exhibits no edema.  Neurological: She is alert and oriented to person, place, and time.  Diabetic Foot Exam  showed no sensory loss with some callous formation but no ulcers present.  Psychiatric: She has a normal mood and affect. Her behavior is normal.       Assessment:     Please refer to Problem List for Assessment.     Plan:     Please refer to Problem List for Plan.

## 2014-11-27 NOTE — Assessment & Plan Note (Addendum)
-   Initial BP 153/85 today. Trended down to 144/88. - Continue current medications: Losartan-HCTZ

## 2014-11-27 NOTE — Patient Instructions (Signed)
Thank you so much for coming to see me today! Your blood pressure was much better on the recheck! We will continue your current blood pressure medicines. Your blood sugars are much improved as well, however I would like for them to be a little lower. We will increase your dose of Glipizide to 10mg . Please keep a close eye out for low blood sugar and let us know if you experience any symptoms; I've included some information below about it. We also recommend the pneumonia shot in those that are diabetic, so if you would like one you may have this done today!  You are doing an amazing job keeping track of your sugars on your phone, so keep up the good work! Please follow up in 2 months so we can recheck your A1C and see how you are doing!    Thanks again! Dr. Gerlean Ren  Hypoglycemia Hypoglycemia occurs when the glucose in your blood is too low. Glucose is a type of sugar that is your body's main energy source. Hormones, such as insulin and glucagon, control the level of glucose in the blood. Insulin lowers blood glucose and glucagon increases blood glucose. Having too much insulin in your blood stream, or not eating enough food containing sugar, can result in hypoglycemia. Hypoglycemia can happen to people with or without diabetes. It can develop quickly and can be a medical emergency.  CAUSES   Missing or delaying meals.  Not eating enough carbohydrates at meals.  Taking too much diabetes medicine.  Not timing your oral diabetes medicine or insulin doses with meals, snacks, and exercise.  Nausea and vomiting.  Certain medicines.  Severe illnesses, such as hepatitis, kidney disorders, and certain eating disorders.  Increased activity or exercise without eating something extra or adjusting medicines.  Drinking too much alcohol.  A nerve disorder that affects body functions like your heart rate, blood pressure, and digestion (autonomic neuropathy).  A condition where the stomach muscles do not  function properly (gastroparesis). Therefore, medicines and food may not absorb properly.  Rarely, a tumor of the pancreas can produce too much insulin. SYMPTOMS   Hunger.  Sweating (diaphoresis).  Change in body temperature.  Shakiness.  Headache.  Anxiety.  Lightheadedness.  Irritability.  Difficulty concentrating.  Dry mouth.  Tingling or numbness in the hands or feet.  Restless sleep or sleep disturbances.  Altered speech and coordination.  Change in mental status.  Seizures or prolonged convulsions.  Combativeness.  Drowsiness (lethargic).  Weakness.  Increased heart rate or palpitations.  Confusion.  Pale, gray skin color.  Blurred or double vision.  Fainting. DIAGNOSIS  A physical exam and medical history will be performed. Your caregiver may make a diagnosis based on your symptoms. Blood tests and other lab tests may be performed to confirm a diagnosis. Once the diagnosis is made, your caregiver will see if your signs and symptoms go away once your blood glucose is raised.  TREATMENT  Usually, you can easily treat your hypoglycemia when you notice symptoms.  Check your blood glucose. If it is less than 70 mg/dl, take one of the following:   3-4 glucose tablets.    cup juice.    cup regular soda.   1 cup skim milk.   -1 tube of glucose gel.   5-6 hard candies.   Avoid high-fat drinks or food that may delay a rise in blood glucose levels.  Do not take more than the recommended amount of sugary foods, drinks, gel, or tablets. Doing so  will cause your blood glucose to go too high.   Wait 10-15 minutes and recheck your blood glucose. If it is still less than 70 mg/dl or below your target range, repeat treatment.   Eat a snack if it is more than 1 hour until your next meal.  There may be a time when your blood glucose may go so low that you are unable to treat yourself at home when you start to notice symptoms. You may need  someone to help you. You may even faint or be unable to swallow. If you cannot treat yourself, someone will need to bring you to the hospital.  [place]  If you have diabetes, follow your diabetes management plan by:  Taking your medicines as directed.  Following your exercise plan.  Following your meal plan. Do not skip meals. Eat on time.  Testing your blood glucose regularly. Check your blood glucose before and after exercise. If you exercise longer or different than usual, be sure to check blood glucose more frequently.  Wearing your medical alert jewelry that says you have diabetes.  Identify the cause of your hypoglycemia. Then, develop ways to prevent the recurrence of hypoglycemia.  Do not take a hot bath or shower right after an insulin shot.  Always carry treatment with you. Glucose tablets are the easiest to carry.  If you are going to drink alcohol, drink it only with meals.  Tell friends or family members ways to keep you safe during a seizure. This may include removing hard or sharp objects from the area or turning you on your side.  Maintain a healthy weight. SEEK MEDICAL CARE IF:   You are having problems keeping your blood glucose in your target range.  You are having frequent episodes of hypoglycemia.  You feel you might be having side effects from your medicines.  You are not sure why your blood glucose is dropping so low.  You notice a change in vision or a new problem with your vision. SEEK IMMEDIATE MEDICAL CARE IF:   Confusion develops.  A change in mental status occurs.  The inability to swallow develops.  Fainting occurs. Document Released: 12/07/2005 Document Revised: 12/12/2013 Document Reviewed: 04/04/2012 [facility] Patient Information 2015 [place], [place]. This information is not intended to replace advice given to you by your health care provider. Make sure you discuss any questions you have with your health care provider.

## 2014-12-10 ENCOUNTER — Ambulatory Visit: Payer: 59 | Admitting: Family Medicine

## 2014-12-24 ENCOUNTER — Telehealth: Payer: Self-pay | Admitting: *Deleted

## 2014-12-24 DIAGNOSIS — E119 Type 2 diabetes mellitus without complications: Secondary | ICD-10-CM

## 2014-12-24 NOTE — Telephone Encounter (Signed)
Received a fax from [facility] requesting to change pt's diabetic supplies including the meter to One Touch.  Accu-Chek Aviva is not preferred by pt's insurance.  Please include complete directions and not use as instructed and ICD-10 code.  Anna Barrow, RN

## 2014-12-27 MED ORDER — GLUCOSE BLOOD VI STRP: ORAL_STRIP | Status: DC

## 2014-12-27 MED ORDER — LANCET DEVICE MISC: 1.0000 | Freq: Every day | Status: AC

## 2014-12-27 NOTE — Telephone Encounter (Signed)
Diabetic prescriptions changed to One Touch.

## 2015-01-07 ENCOUNTER — Telehealth: Payer: Self-pay | Admitting: Family Medicine

## 2015-01-07 DIAGNOSIS — E119 Type 2 diabetes mellitus without complications: Secondary | ICD-10-CM

## 2015-01-07 NOTE — Telephone Encounter (Signed)
Patient need a prescription sent to [facility] on [address] For a One Touch Meter with strips

## 2015-01-08 MED ORDER — GLUCOSE BLOOD VI STRP: ORAL_STRIP | Status: DC

## 2015-02-08 ENCOUNTER — Encounter (HOSPITAL_COMMUNITY): Payer: Self-pay | Admitting: Emergency Medicine

## 2015-02-08 ENCOUNTER — Emergency Department (INDEPENDENT_AMBULATORY_CARE_PROVIDER_SITE_OTHER)
Admission: EM | Admit: 2015-02-08 | Discharge: 2015-02-08 | Disposition: A | Discharge status: Home / Self Care | Payer: 59 | Source: Home / Self Care | Attending: Family Medicine | Admitting: Family Medicine

## 2015-02-08 DIAGNOSIS — H538 Other visual disturbances: Secondary | ICD-10-CM

## 2015-02-08 DIAGNOSIS — H5712 Ocular pain, left eye: Secondary | ICD-10-CM | POA: Diagnosis not present

## 2015-02-08 MED ORDER — OLOPATADINE HCL 0.2 % OP SOLN: OPHTHALMIC | Status: DC

## 2015-02-08 MED ORDER — POLYMYXIN B-TRIMETHOPRIM 10000-0.1 UNIT/ML-% OP SOLN: 1.0000 [drp] | OPHTHALMIC | Status: DC

## 2015-02-08 NOTE — ED Provider Notes (Signed)
CSN: [id number]     Arrival date & time 02/08/15  1350 History   First MD Initiated Contact with Patient 02/08/15 1518     Chief Complaint  Patient presents with  . Eye Problem   (Consider location/radiation/quality/duration/timing/severity/associated sxs/prior Treatment) HPI         39 year old female presents for evaluation of left eye pain, left eye blurry vision. Her symptoms started yesterday. She describes the pain as a mild burning sensation on the surface of her left eye. She feels like the eye is dry as well. She feels like her vision is very slightly decreased and that eye. She denies any other systemic symptoms. She has had this once before a few years ago.   Past Medical History  Diagnosis Date  . Asthma   . Chlamydia   . Obesity   . Fatty liver   . Hx of shoulder dystocia, prior pregnancy, currently pregnant   . Pregnancy induced hypertension     diagnosed with pregnancy-started on aldomet-delivered 08/28/11  . Diabetes mellitus    Past Surgical History  Procedure Laterality Date  . Therapeutic abortion    . Wisdom tooth extraction    . Laparoscopic tubal ligation  10/29/2011    Procedure: LAPAROSCOPIC TUBAL LIGATION;  Surgeon: Mora Bellman, MD;  Location: [facility];  Service: Gynecology;  Laterality: Bilateral;  . Tubal ligation     Family History  Problem Relation Age of Onset  . Diabetes Mother   . Hypertension Mother   . Asthma Mother   . Diabetes Brother   . Hypertension Brother   . Asthma Brother   . Diabetes Father   . Cancer Father   . Asthma Father   . Hypertension Father    History  Substance Use Topics  . Smoking status: Never Smoker   . Smokeless tobacco: Never Used  . Alcohol Use: Yes     Comment: occasional   OB History    Gravida Para Term Preterm AB TAB SAB Ectopic Multiple Living   6 2 2  0 4 2 2  0 0 2     Review of Systems  Constitutional: Negative for fever and chills.  Eyes: Positive for pain, itching and visual disturbance. Negative  for photophobia, discharge and redness.  All other systems reviewed and are negative.   Allergies  Lisinopril  Home Medications   Prior to Admission medications   Medication Sig Start Date End Date Taking? Authorizing Provider  glipiZIDE (GLUCOTROL XL) 10 MG 24 hr tablet Take 1 tablet (10 mg total) by mouth daily with breakfast. 11/27/14  Yes [facility], DO  glucose blood test strip Use as instructed 12/27/14  Yes [employer], DO  glucose blood test strip Use as instructed 01/08/15  Yes [place] [employer], DO  Lancet Device MISC 1 Device by Does not apply route daily. 12/27/14  Yes Colwyn N Rumley, DO  losartan-hydrochlorothiazide (HYZAAR) 100-25 MG per tablet Take 1 tablet by mouth daily. 10/29/14 10/29/15 Yes [place] N Rumley, DO  cephALEXin (KEFLEX) 500 MG capsule Take 1 capsule (500 mg total) by mouth 3 (three) times daily. 01/24/14   Harden Mo, MD  clindamycin (CLEOCIN) 300 MG capsule Take 1 capsule (300 mg total) by mouth 3 (three) times daily. 03/03/14   Billy Fischer, MD  diclofenac (CATAFLAM) 50 MG tablet Take 1 tablet (50 mg total) by mouth 3 (three) times daily. For dental pain 03/03/14   Billy Fischer, MD  fluticasone [facility]) 50 MCG/ACT nasal spray  Place 2 sprays into both nostrils daily. 10/29/14   Phelps N Rumley, DO  HYDROcodone-acetaminophen (NORCO) 5-325 MG per tablet Take 1-2 tablets by mouth every 4 (four) hours as needed. 11/06/14   Julianne Rice, MD  ketorolac (TORADOL) 10 MG tablet Take 1 tablet (10 mg total) by mouth every 6 (six) hours as needed. 11/06/14   Julianne Rice, MD  Olopatadine HCl (PATADAY) 0.2 % SOLN 1 drop per eye once daily as needed for redness, itching, or irritation 02/08/15   Liam Graham, PA-C  ondansetron (ZOFRAN ODT) 4 MG disintegrating tablet 4mg  ODT q4 hours prn nausea/vomit 11/06/14   Julianne Rice, MD  phenazopyridine (PYRIDIUM) 200 MG tablet Take 1 tablet (200 mg total) by mouth 3 (three) times daily as needed for pain. 01/24/14    Harden Mo, MD  traMADol (ULTRAM) 50 MG tablet Take 1 tablet (50 mg total) by mouth every 6 (six) hours as needed for pain. 07/18/13   Alvina Chou, PA-C  trimethoprim-polymyxin b (POLYTRIM) ophthalmic solution Place 1 drop into both eyes every 3 (three) hours. While awake, up to 6 doses per day 02/08/15   Liam Graham, PA-C   BP 146/57 mmHg  Pulse 102  Temp(Src) 98.1 F (36.7 C) (Oral)  Resp 20  SpO2 100%  LMP 01/21/2015 Physical Exam  Constitutional: She is oriented to person, place, and time. Vital signs are normal. She appears well-developed and well-nourished. No distress.  HENT:  Head: Normocephalic and atraumatic.  Eyes: Conjunctivae and EOM are normal. Pupils are equal, round, and reactive to light. Lids are everted and swept, no foreign bodies found. Right eye exhibits no discharge and no exudate. Left eye exhibits no discharge and no exudate. Right conjunctiva is not injected. Left conjunctiva is not injected. No scleral icterus.  Fundoscopic exam:      The right eye shows red reflex.       The left eye shows red reflex.  Slit lamp exam:      The right eye shows no corneal abrasion, no corneal ulcer, no fluorescein uptake and no anterior chamber bulge.       The left eye shows no corneal abrasion and no corneal ulcer.  Pulmonary/Chest: Effort normal. No respiratory distress.  Neurological: She is alert and oriented to person, place, and time. She has normal strength. Coordination normal.  Skin: Skin is warm and dry. No rash noted. She is not diaphoretic.  Psychiatric: She has a normal mood and affect. Judgment normal.  Nursing note and vitals reviewed.   ED Course  Procedures (including critical care time) Labs Review Labs Reviewed - No data to display  Imaging Review No results found.   MDM   1. Eye discomfort, left   2. Blurred vision, left eye    The exam is entirely normal. We'll attempt treatment with Polytrim and had a day drops. She is given  follow-up information for ophthalmology if her symptoms do not improve significantly by Monday. If she worsens over the weekend she will return here for reevaluation  Meds ordered this encounter  Medications  . trimethoprim-polymyxin b (POLYTRIM) ophthalmic solution    Sig: Place 1 drop into both eyes every 3 (three) hours. While awake, up to 6 doses per day    Dispense:  10 mL    Refill:  0    Order Specific Question:  Supervising Provider    Answer:  Billy Fischer [phone]  . Olopatadine HCl (PATADAY) 0.2 % SOLN    Sig:  1 drop per eye once daily as needed for redness, itching, or irritation    Dispense:  2.5 mL    Refill:  0    Order Specific Question:  Supervising Provider    Answer:  Ihor Gully D [[id number]]       Liam Graham, PA-C 02/08/15 1523

## 2015-02-08 NOTE — Discharge Instructions (Signed)
Blurred Vision You have been seen today complaining of blurred vision. This means you have a loss of ability to see small details.  CAUSES  Blurred vision can be a symptom of underlying eye problems, such as:  Aging of the eye (presbyopia).  Glaucoma.  Cataracts.  Eye infection.  Eye-related migraine.  Diabetes mellitus.  Fatigue.  Migraine headaches.  High blood pressure.  Breakdown of the back of the eye (macular degeneration).  Problems caused by some medications. The most common cause of blurred vision is the need for eyeglasses or a new prescription. Today in the emergency department, no cause for your blurred vision can be found. SYMPTOMS  Blurred vision is the loss of visual sharpness and detail (acuity). DIAGNOSIS  Should blurred vision continue, you should see your caregiver. If your caregiver is your primary care physician, he or she may choose to refer you to another specialist.  TREATMENT  Do not ignore your blurred vision. Make sure to have it checked out to see if further treatment or referral is necessary. SEEK MEDICAL CARE IF:  You are unable to get into a specialist so we can help you with a referral. SEEK IMMEDIATE MEDICAL CARE IF: You have severe eye pain, severe headache, or sudden loss of vision. MAKE SURE YOU:   Understand these instructions.  Will watch your condition.  Will get help right away if you are not doing well or get worse. Document Released: 12/10/2003 Document Revised: 02/29/2012 Document Reviewed: 07/11/2008 [employer] Patient Information 2015 [place], [place]. This information is not intended to replace advice given to you by your health care provider. Make sure you discuss any questions you have with your health care provider.  Eye Drops Use eye drops as directed. It may be easier to have someone help you put the drops in your eye. If you are alone, use the following instructions to help you.  Wash your hands before putting drops in  your eyes.  Read the label and look at your medication. Check for any expiration date that may appear on the bottle or tube. Changes of color may be a warning that the medication is old or ineffective. This is especially true if the medication has become brown in color. If you have questions or concerns, call your caregiver. DROPS  Tilt your head back with the affected eye uppermost. Gently pull down on your lower lid. Do not pull up on the upper lid.  Look up. Place the dropper or bottle just over the edge of the lower lid near the white portion at the bottom of the eye. The goal is to have the drop go into the little sac formed by the lower lid and the bottom of the eye itself. Do not release the drop from a height of several inches over the eye. That will only serve to startle the person receiving the medicine when it lands and forces a blink.  Steady your hand in a comfortable manner. An example would be to hold the dropper or bottle between your thumb and index (pointing) finger. Lean your index finger against the brow.  Then, slowly and gently squeeze one drop of medication into your eye.  Once the medication has been applied, place your finger between the lower eyelid and the nose, pressing firmly against the nose for 5-10 seconds. This will slow the process of the eye drop entering the small canal that normally drains tears into the nose, and therefore increases the exposure of the medicine to the  eye for a few extra seconds. OINTMENTS  Look up. Place the tip of the tube just over the edge of the lower lid near the white portion at the bottom of the eye. The goal is to create a line of ointment along the inner surface of the eyelid in the little sac formed by the lower lid and the bottom of the eye itself.  Avoid touching the tube tip to your eyeball or eyelid. This avoids contamination of the tube or the medicine in the tube.  Once a line of medicine has been created, hold the upper lid  up and look down before releasing the upper lid. This will force the ointment to spread over the surface of the eye.  Your vision will be very blurry for a few minutes after applying an ointment properly. This is normal and will clear as you continue to blink. For this reason, it is best to apply ointments just before going to sleep, or at a time when you can rest your eyes for 5-10 minutes after applying the medication. GENERAL  Store your medicine in a cool, dry place after each use.  If you need a second medication, wait at least two minutes. This helps the first medication to be taken up (absorbed) by the eye.  If you have been instructed to use both an eye drop and an eye ointment, always apply the drop first and then the ointment 3-4 minutes afterward. Never put medications into the eye unless the label reads, "For Ophthalmic Use," "For Use In Eyes" or "Eye Drops." If you have questions, call your caregiver. Document Released: 03/15/2001 Document Revised: 04/23/2014 Document Reviewed: 05/21/2009 [facility] Patient Information 2015 [place], [place]. This information is not intended to replace advice given to you by your health care provider. Make sure you discuss any questions you have with your health care provider.

## 2015-03-04 ENCOUNTER — Encounter: Payer: Self-pay | Admitting: Family Medicine

## 2015-03-04 ENCOUNTER — Ambulatory Visit (INDEPENDENT_AMBULATORY_CARE_PROVIDER_SITE_OTHER): Payer: 59 | Admitting: Family Medicine

## 2015-03-04 VITALS — BP 118/78 | HR 106 | Temp 98.2°F | Ht 63.0 in | Wt 313.0 lb

## 2015-03-04 DIAGNOSIS — E119 Type 2 diabetes mellitus without complications: Secondary | ICD-10-CM

## 2015-03-04 LAB — POCT GLYCOSYLATED HEMOGLOBIN (HGB A1C): Hemoglobin A1C: 8.2

## 2015-03-04 MED ORDER — GLIPIZIDE ER 5 MG PO TB24: 15.0000 mg | ORAL_TABLET | Freq: Every day | ORAL | Status: DC

## 2015-03-04 MED ORDER — CETIRIZINE HCL 10 MG PO TABS: 10.0000 mg | ORAL_TABLET | Freq: Every day | ORAL | Status: DC

## 2015-03-04 MED ORDER — GLIPIZIDE ER 10 MG PO TB24: 10.0000 mg | ORAL_TABLET | Freq: Every day | ORAL | Status: DC

## 2015-03-04 MED ORDER — GABAPENTIN 300 MG PO CAPS: 300.0000 mg | ORAL_CAPSULE | Freq: Every day | ORAL | Status: DC

## 2015-03-04 NOTE — Patient Instructions (Signed)
Thank you so much for coming to visit me today! We will increase your dose of Glipizide to 15mg . Your A1C is a lot better than at your last visit... It improved from 10.4 to 8.2!!!! Continue to work on your diet and exercise! You are doing a great job!  I have sent in for Zyrtec to help with your allergies. I have also sent in for Gabapentin to help with the nerve pain in your left foot; the dose of this medication can be increased if needed, so let me know if it works or not!  Thanks again! Dr. Gerlean Ren

## 2015-03-05 NOTE — Assessment & Plan Note (Signed)
-   A1C improved from 10.4 to 8.2 - Will increase dose of Glipizide to 15mg  daily. Failed metformin. - Encouraged to continue working on diet and exercise.  - Starting Gabapentin 300mg  at night. Discussed that this is a very small dose and that this can be titrated up if needed - Follow up in 3 months to recheck A1C

## 2015-03-05 NOTE — Progress Notes (Signed)
Subjective:     Patient ID: Anna Hawkins, female   DOB: Jun 12, 1976, 39 y.o.   MRN: [id number]  HPI Anna Hawkins is a 39yo female presenting today for diabetes followup. - States she has cut soft drinks out of her diet except for one every once in a while. Mostly drinks water. - Mostly eats fish and vegetables - Bought a treadmill for Valentine's Day and has been trying to use it at least three times a week - Sugars average about 130, but was 222 after lunch today. - Denies any episodes of hypoglycemia - Notes nerve like pain shooting down into her left foot, worse at night - Failed treatment with metformin. States she was hospitalized every time she tried to start taking it - Would like something else for allergies, stating Flonase is not helping. Zyrtec and Claritin usually help, with Zyrtec working the best.  Review of Systems  All other systems reviewed and are negative.      Objective:   Physical Exam  Constitutional: She appears well-developed and well-nourished. No distress.  Cardiovascular: Normal rate and regular rhythm.  Exam reveals no gallop and no friction rub.   No murmur heard. Pulmonary/Chest: Effort normal. No respiratory distress. She has no wheezes. She has no rales.  Abdominal: Soft. She exhibits no distension. There is no tenderness.  Musculoskeletal: She exhibits no edema.  Neurological:  Sensation intact in feet bilaterally  Skin:  No ulcers noted on feet bilaterally  Psychiatric: She has a normal mood and affect. Her behavior is normal.       Assessment:     Please refer to Problem List for Assessment.     Plan:     Please refer to Problem List for Plan.

## 2015-06-11 ENCOUNTER — Emergency Department (HOSPITAL_COMMUNITY): Payer: 59

## 2015-06-11 ENCOUNTER — Emergency Department (HOSPITAL_COMMUNITY)
Admission: EM | Admit: 2015-06-11 | Discharge: 2015-06-11 | Disposition: A | Discharge status: Home / Self Care | Payer: 59 | Attending: Emergency Medicine | Admitting: Emergency Medicine

## 2015-06-11 ENCOUNTER — Encounter (HOSPITAL_COMMUNITY): Payer: Self-pay | Admitting: Emergency Medicine

## 2015-06-11 DIAGNOSIS — R0602 Shortness of breath: Secondary | ICD-10-CM

## 2015-06-11 DIAGNOSIS — I10 Essential (primary) hypertension: Secondary | ICD-10-CM | POA: Diagnosis not present

## 2015-06-11 DIAGNOSIS — Z79899 Other long term (current) drug therapy: Secondary | ICD-10-CM | POA: Insufficient documentation

## 2015-06-11 DIAGNOSIS — R590 Localized enlarged lymph nodes: Secondary | ICD-10-CM | POA: Diagnosis not present

## 2015-06-11 DIAGNOSIS — D649 Anemia, unspecified: Secondary | ICD-10-CM

## 2015-06-11 DIAGNOSIS — R0789 Other chest pain: Secondary | ICD-10-CM | POA: Insufficient documentation

## 2015-06-11 DIAGNOSIS — Z8619 Personal history of other infectious and parasitic diseases: Secondary | ICD-10-CM | POA: Insufficient documentation

## 2015-06-11 DIAGNOSIS — J45901 Unspecified asthma with (acute) exacerbation: Secondary | ICD-10-CM | POA: Diagnosis not present

## 2015-06-11 DIAGNOSIS — Z8719 Personal history of other diseases of the digestive system: Secondary | ICD-10-CM | POA: Diagnosis not present

## 2015-06-11 DIAGNOSIS — E1165 Type 2 diabetes mellitus with hyperglycemia: Secondary | ICD-10-CM | POA: Insufficient documentation

## 2015-06-11 DIAGNOSIS — E669 Obesity, unspecified: Secondary | ICD-10-CM | POA: Diagnosis not present

## 2015-06-11 DIAGNOSIS — Z7951 Long term (current) use of inhaled steroids: Secondary | ICD-10-CM | POA: Insufficient documentation

## 2015-06-11 DIAGNOSIS — R05 Cough: Secondary | ICD-10-CM

## 2015-06-11 DIAGNOSIS — Z792 Long term (current) use of antibiotics: Secondary | ICD-10-CM | POA: Diagnosis not present

## 2015-06-11 DIAGNOSIS — J069 Acute upper respiratory infection, unspecified: Secondary | ICD-10-CM | POA: Diagnosis not present

## 2015-06-11 DIAGNOSIS — R059 Cough, unspecified: Secondary | ICD-10-CM

## 2015-06-11 LAB — CBC WITH DIFFERENTIAL/PLATELET
BASOS ABS: 0 10*3/uL (ref 0.0–0.1)
BASOS PCT: 0 % (ref 0–1)
Eosinophils Absolute: 0.3 10*3/uL (ref 0.0–0.7)
Eosinophils Relative: 6 % — ABNORMAL HIGH (ref 0–5)
HCT: 34.2 % — ABNORMAL LOW (ref 36.0–46.0)
Hemoglobin: 11 g/dL — ABNORMAL LOW (ref 12.0–15.0)
Lymphocytes Relative: 42 % (ref 12–46)
Lymphs Abs: 2.3 10*3/uL (ref 0.7–4.0)
MCH: 22.9 pg — ABNORMAL LOW (ref 26.0–34.0)
MCHC: 32.2 g/dL (ref 30.0–36.0)
MCV: 71.3 fL — ABNORMAL LOW (ref 78.0–100.0)
Monocytes Absolute: 0.5 10*3/uL (ref 0.1–1.0)
Monocytes Relative: 9 % (ref 3–12)
NEUTROS PCT: 43 % (ref 43–77)
Neutro Abs: 2.3 10*3/uL (ref 1.7–7.7)
Platelets: 247 10*3/uL (ref 150–400)
RBC: 4.8 MIL/uL (ref 3.87–5.11)
RDW: 14 % (ref 11.5–15.5)
WBC: 5.4 10*3/uL (ref 4.0–10.5)

## 2015-06-11 LAB — I-STAT CHEM 8, ED
BUN: 9 mg/dL (ref 6–20)
CREATININE: 0.8 mg/dL (ref 0.44–1.00)
Calcium, Ion: 1.14 mmol/L (ref 1.12–1.23)
Chloride: 106 mmol/L (ref 101–111)
Glucose, Bld: 277 mg/dL — ABNORMAL HIGH (ref 65–99)
HEMATOCRIT: 38 % (ref 36.0–46.0)
HEMOGLOBIN: 12.9 g/dL (ref 12.0–15.0)
POTASSIUM: 3.6 mmol/L (ref 3.5–5.1)
SODIUM: 142 mmol/L (ref 135–145)
TCO2: 21 mmol/L (ref 0–100)

## 2015-06-11 LAB — I-STAT TROPONIN, ED: Troponin i, poc: 0 ng/mL (ref 0.00–0.08)

## 2015-06-11 MED ORDER — PREDNISONE 20 MG PO TABS: ORAL_TABLET | ORAL | Status: DC

## 2015-06-11 MED ORDER — IPRATROPIUM BROMIDE 0.02 % IN SOLN
0.5000 mg | Freq: Once | RESPIRATORY_TRACT | Status: AC
  Administered 2015-06-11: 0.5 mg via RESPIRATORY_TRACT
  Filled 2015-06-11: qty 2.5

## 2015-06-11 MED ORDER — ALBUTEROL SULFATE (2.5 MG/3ML) 0.083% IN NEBU
5.0000 mg | INHALATION_SOLUTION | Freq: Once | RESPIRATORY_TRACT | Status: AC
  Administered 2015-06-11: 5 mg via RESPIRATORY_TRACT
  Filled 2015-06-11: qty 6

## 2015-06-11 MED ORDER — ALBUTEROL SULFATE HFA 108 (90 BASE) MCG/ACT IN AERS: 2.0000 | INHALATION_SPRAY | RESPIRATORY_TRACT | Status: DC | PRN

## 2015-06-11 MED ORDER — PREDNISONE 20 MG PO TABS
60.0000 mg | ORAL_TABLET | Freq: Once | ORAL | Status: AC
  Administered 2015-06-11: 60 mg via ORAL
  Filled 2015-06-11: qty 3

## 2015-06-11 NOTE — ED Provider Notes (Signed)
CSN: [id number]     Arrival date & time 06/11/15  1151 History   First MD Initiated Contact with Patient 06/11/15 1201     Chief Complaint  Patient presents with  . Shortness of Breath     (Consider location/radiation/quality/duration/timing/severity/associated sxs/prior Treatment) HPI Comments: Anna Hawkins is a 39 y.o. female with a PMHx of asthma, obesity, pregnancy induced HTN, and DM2, who presents to the ED with complaints of URI symptoms that began on Friday 4 days ago. Symptoms include a dry nonproductive cough, sinus congestion, sore throat, intermittent "tingling" in her right ear only with coughing, shortness of breath, wheezing, and mild chest tightness. She reports that her ear is not painful, and the "tingling" only occurs with coughing. Her shortness of breath worsens with coughing. She has tried Alka-Seltzer and albuterol with no relief of symptoms. She denies any fevers, chills, chest pain, hemoptysis, PND, orthopnea, leg swelling, recent travel/surgery/immobilization, history of DVT/PE, estrogen use, abdominal pain, nausea, vomiting, diarrhea, constipation, dysuria, hematuria, vaginal bleeding or discharge, numbness, tingling, weakness, rhinorrhea, trismus, drooling, ear pain or drainage, eye itching or discharge. She endorses seasonal allergies, and has positive sick contacts at home stating that her granddaughter is sick with similar symptoms. States this feels like prior asthma exacerbations.   Patient is a 39 y.o. female presenting with shortness of breath. The history is provided by the patient. No language interpreter was used.  Shortness of Breath Severity:  Mild Onset quality:  Gradual Duration:  4 days Timing:  Constant Progression:  Unchanged Chronicity:  Recurrent Context: URI   Relieved by:  Nothing Worsened by:  Coughing Ineffective treatments:  Inhaler (and alka seltzer) Associated symptoms: cough, sore throat and wheezing   Associated symptoms: no  abdominal pain, no chest pain, no ear pain, no fever, no hemoptysis, no PND, no sputum production, no swollen glands and no vomiting   Risk factors: obesity   Risk factors: no hx of PE/DVT, no oral contraceptive use, no prolonged immobilization, no recent surgery and no tobacco use     Past Medical History  Diagnosis Date  . Asthma   . Chlamydia   . Obesity   . Fatty liver   . Hx of shoulder dystocia, prior pregnancy, currently pregnant   . Pregnancy induced hypertension     diagnosed with pregnancy-started on aldomet-delivered 08/28/11  . Diabetes mellitus    Past Surgical History  Procedure Laterality Date  . Therapeutic abortion    . Wisdom tooth extraction    . Laparoscopic tubal ligation  10/29/2011    Procedure: LAPAROSCOPIC TUBAL LIGATION;  Surgeon: Mora Bellman, MD;  Location: [facility];  Service: Gynecology;  Laterality: Bilateral;  . Tubal ligation     Family History  Problem Relation Age of Onset  . Diabetes Mother   . Hypertension Mother   . Asthma Mother   . Diabetes Brother   . Hypertension Brother   . Asthma Brother   . Diabetes Father   . Cancer Father   . Asthma Father   . Hypertension Father    History  Substance Use Topics  . Smoking status: Never Smoker   . Smokeless tobacco: Never Used  . Alcohol Use: Yes     Comment: occasional   OB History    Gravida Para Term Preterm AB TAB SAB Ectopic Multiple Living   6 2 2  0 4 2 2  0 0 2     Review of Systems  Constitutional: Negative for fever and chills.  HENT: Positive  for sinus pressure and sore throat. Negative for drooling, ear discharge, ear pain, rhinorrhea and trouble swallowing.   Eyes: Negative for discharge and itching.  Respiratory: Positive for cough, chest tightness, shortness of breath and wheezing. Negative for hemoptysis and sputum production.   Cardiovascular: Negative for chest pain, leg swelling and PND.  Gastrointestinal: Negative for nausea, vomiting, abdominal pain, diarrhea and  constipation.  Genitourinary: Negative for dysuria, hematuria, vaginal bleeding and vaginal discharge.  Musculoskeletal: Negative for myalgias and arthralgias.  Skin: Negative for color change.  Allergic/Immunologic: Positive for environmental allergies (seasonal) and immunocompromised state (diabetic).  Neurological: Negative for weakness and numbness.  Psychiatric/Behavioral: Negative for confusion.   10 Systems reviewed and are negative for acute change except as noted in the HPI.    Allergies  Lisinopril  Home Medications   Prior to Admission medications   Medication Sig Start Date End Date Taking? Authorizing Provider  cephALEXin (KEFLEX) 500 MG capsule Take 1 capsule (500 mg total) by mouth 3 (three) times daily. 01/24/14   Harden Mo, MD  cetirizine (ZYRTEC) 10 MG tablet Take 1 tablet (10 mg total) by mouth daily. 03/04/15   [place]ardner N Rumley, DO  clindamycin (CLEOCIN) 300 MG capsule Take 1 capsule (300 mg total) by mouth 3 (three) times daily. 03/03/14   Billy Fischer, MD  diclofenac (CATAFLAM) 50 MG tablet Take 1 tablet (50 mg total) by mouth 3 (three) times daily. For dental pain 03/03/14   Billy Fischer, MD  fluticasone [facility]) 50 MCG/ACT nasal spray Place 2 sprays into both nostrils daily. 10/29/14   [place] N Rumley, DO  gabapentin (NEURONTIN) 300 MG capsule Take 1 capsule (300 mg total) by mouth at bedtime. 03/04/15   [place] N Rumley, DO  glipiZIDE (GLUCOTROL XL) 5 MG 24 hr tablet Take 3 tablets (15 mg total) by mouth daily with breakfast. 03/04/15   Lorna Few, DO  glucose blood test strip Use as instructed 12/27/14   Burna Cash Rumley, DO  glucose blood test strip Use as instructed 01/08/15   [place] N Rumley, DO  HYDROcodone-acetaminophen (NORCO) 5-325 MG per tablet Take 1-2 tablets by mouth every 4 (four) hours as needed. 11/06/14   Julianne Rice, MD  ketorolac (TORADOL) 10 MG tablet Take 1 tablet (10 mg total) by mouth every 6 (six) hours as needed. 11/06/14   Julianne Rice, MD  Lancet Device MISC 1 Device by Does not apply route daily. 12/27/14   [place] N Rumley, DO  losartan-hydrochlorothiazide (HYZAAR) 100-25 MG per tablet Take 1 tablet by mouth daily. 10/29/14 10/29/15  [place] N Rumley, DO  Olopatadine HCl (PATADAY) 0.2 % SOLN 1 drop per eye once daily as needed for redness, itching, or irritation 02/08/15   Liam Graham, PA-C  ondansetron (ZOFRAN ODT) 4 MG disintegrating tablet 4mg  ODT q4 hours prn nausea/vomit 11/06/14   Julianne Rice, MD  phenazopyridine (PYRIDIUM) 200 MG tablet Take 1 tablet (200 mg total) by mouth 3 (three) times daily as needed for pain. 01/24/14   Harden Mo, MD  traMADol (ULTRAM) 50 MG tablet Take 1 tablet (50 mg total) by mouth every 6 (six) hours as needed for pain. 07/18/13   Alvina Chou, PA-C  trimethoprim-polymyxin b (POLYTRIM) ophthalmic solution Place 1 drop into both eyes every 3 (three) hours. While awake, up to 6 doses per day 02/08/15   Liam Graham, PA-C   BP 161/87 mmHg  Pulse 98  Temp(Src) 97.9 F (36.6 C) (Oral)  Resp 20  SpO2 100%  LMP 06/09/2015 Physical Exam  Constitutional: She is oriented to person, place, and time. Vital signs are normal. She appears well-developed and well-nourished.  Non-toxic appearance. No distress.  Afebrile, nontoxic, NAD  HENT:  Head: Normocephalic and atraumatic.  Right Ear: Hearing, tympanic membrane, external ear and ear canal normal.  Left Ear: Hearing, tympanic membrane, external ear and ear canal normal.  Nose: Mucosal edema and rhinorrhea present. Right sinus exhibits maxillary sinus tenderness. Right sinus exhibits no frontal sinus tenderness. Left sinus exhibits maxillary sinus tenderness. Left sinus exhibits no frontal sinus tenderness.  Mouth/Throat: Uvula is midline, oropharynx is clear and moist and mucous membranes are normal. No trismus in the jaw. No uvula swelling.  Ears are clear bilaterally. Nose with clear rhinorrhea and mucosal edema, b/l  maxillary sinus TTP. Oropharynx clear and moist, without uvular swelling or deviation, no trismus or drooling, no tonsillar swelling or erythema, no exudates.    Eyes: Conjunctivae and EOM are normal. Right eye exhibits no discharge. Left eye exhibits no discharge.  Neck: Normal range of motion. Neck supple.  Cardiovascular: Normal rate, regular rhythm, normal heart sounds and intact distal pulses.  Exam reveals no gallop and no friction rub.   No murmur heard. RRR, nl s1/s2, no m/r/g, distal pulses intact, no pedal edema   Pulmonary/Chest: Effort normal. No respiratory distress. She has decreased breath sounds. She has wheezes. She has no rhonchi. She has no rales.  Somewhat diminished breath sounds throughout which could be due to body habitus obscuring exam vs bronchospasm and poor air movement, mild trace wheezing audible throughout, no rhonchi or rales appreciated, no hypoxia or increased WOB, speaking in full sentences, SpO2 100% on RA   Abdominal: Soft. Normal appearance and bowel sounds are normal. She exhibits no distension. There is no tenderness. There is no rigidity, no rebound, no guarding, no CVA tenderness, no tenderness at McBurney's point and negative Murphy's sign.  Musculoskeletal: Normal range of motion.  MAE x4 Strength and sensation grossly intact Distal pulses intact No pedal edema, neg homan's bilaterally   Lymphadenopathy:       Head (right side): No submandibular and no tonsillar adenopathy present.       Head (left side): No submandibular and no tonsillar adenopathy present.    She has cervical adenopathy.  Shotty cervical LAD bilaterally which is nonTTP  Neurological: She is alert and oriented to person, place, and time. She has normal strength. No sensory deficit.  Skin: Skin is warm, dry and intact. No rash noted.  Psychiatric: She has a normal mood and affect.  Nursing note and vitals reviewed.   ED Course  Procedures (including critical care time) Labs  Review Labs Reviewed  CBC WITH DIFFERENTIAL/PLATELET - Abnormal; Notable for the following:    Hemoglobin 11.0 (*)    HCT 34.2 (*)    MCV 71.3 (*)    MCH 22.9 (*)    Eosinophils Relative 6 (*)    All other components within normal limits  I-STAT CHEM 8, ED - Abnormal; Notable for the following:    Glucose, Bld 277 (*)    All other components within normal limits  I-STAT TROPOININ, ED    Imaging Review Dg Chest 2 View  06/11/2015   CLINICAL DATA:  Shortness of breath and cough for 4 days  EXAM: CHEST  2 VIEW  COMPARISON:  May 08, 2012  FINDINGS: Lungs are clear. Heart size and pulmonary vascularity are normal. No adenopathy. No bone lesions.  IMPRESSION:  No edema or consolidation.   Electronically Signed   By: Lowella Grip III M.D.   On: 06/11/2015 13:42     EKG Interpretation None      MDM   Final diagnoses:  Shortness of breath  Cough  Asthma exacerbation  URI (upper respiratory infection)  Anemia, unspecified anemia type  Hyperglycemia due to type 2 diabetes mellitus  HTN (hypertension), benign  Chest tightness    39 y.o. female here with URI symptoms and SOB/chest tightness but denies CP. States it feels similar to asthma exacerbations in the past. Some mild wheezing on exam, overall somewhat diminished breath sounds likely from bronchospasm vs body habitus obscuring exam. No rhonchi or rales. Will get basic labs, EKG, CXR, and give prednisone and nebs then reassess.   1:59 PM EKG unremarkable. Chem 8 showing hyperglycemia without anion gap. Trop neg. CBC w/diff showing chronic anemia but no leukocytosis. CXR clear. Lung sounds improved after nebs/prednisone. Will treat as asthma exacerbation, doubt need for abx. Discussed OTC meds for symptoms. Will have her f/up with PCP in 1 wk. I explained the diagnosis and have given explicit precautions to return to the ER including for any other new or worsening symptoms. The patient understands and accepts the medical plan as  it's been dictated and I have answered their questions. Discharge instructions concerning home care and prescriptions have been given. The patient is STABLE and is discharged to home in good condition.  BP 161/87 mmHg  Pulse 98  Temp(Src) 97.9 F (36.6 C) (Oral)  Resp 20  SpO2 100%  LMP 06/09/2015  Meds ordered this encounter  Medications  . predniSONE (DELTASONE) tablet 60 mg    Sig:   . ipratropium (ATROVENT) nebulizer solution 0.5 mg    Sig:   . albuterol (PROVENTIL) (2.5 MG/3ML) 0.083% nebulizer solution 5 mg    Sig:   . predniSONE (DELTASONE) 20 MG tablet    Sig: 3 tabs po daily x 3 days    Dispense:  9 tablet    Refill:  0    Order Specific Question:  Supervising Provider    Answer:  MILLER, [place]  . albuterol (PROVENTIL HFA;VENTOLIN HFA) 108 (90 BASE) MCG/ACT inhaler    Sig: Inhale 2 puffs into the lungs every 2 (two) hours as needed for wheezing or shortness of breath (cough).    Dispense:  1 Inhaler    Refill:  0    Order Specific Question:  Supervising Provider    Answer:  Noemi Chapel [[id number]]     Shatara Stanek Camprubi-Soms, PA-C 06/11/15 1400  Davonna Belling, MD 06/12/15 [phone]

## 2015-06-11 NOTE — Discharge Instructions (Signed)
Continue to stay well-hydrated. Gargle warm salt water and spit it out. Use chloraseptic spray as needed for your sore throat. Continue to alternate between Tylenol and Ibuprofen for pain or fever. Use Mucinex for cough suppression/expectoration of mucus. Use netipot and flonase to help with nasal congestion. May consider over-the-counter Benadryl or other antihistamine to decrease secretions and for watery itchy eyes. Use inhaler as directed, as needed for cough/chest congestion. Take prednisone as directed, beginning tomorrow. Followup with your primary care doctor in 5-7 days for recheck of ongoing symptoms. Return to emergency department for emergent changing or worsening of symptoms.    Asthma Asthma is a condition of the lungs in which the airways tighten and narrow. Asthma can make it hard to breathe. Asthma cannot be cured, but medicine and lifestyle changes can help control it. Asthma may be started (triggered) by:  Animal skin flakes (dander).  Dust.  Cockroaches.  Pollen.  Mold.  Smoke.  Cleaning products.  Hair sprays or aerosol sprays.  Paint fumes or strong smells.  Cold air, weather changes, and winds.  Crying or laughing hard.  Stress.  Certain medicines or drugs.  Foods, such as dried fruit, potato chips, and sparkling grape juice.  Infections or conditions (colds, flu).  Exercise.  Certain medical conditions or diseases.  Exercise or tiring activities. HOME CARE   Take medicine as told by your doctor.  Use a peak flow meter as told by your doctor. A peak flow meter is a tool that measures how well the lungs are working.  Record and keep track of the peak flow meter's readings.  Understand and use the asthma action plan. An asthma action plan is a written plan for taking care of your asthma and treating your attacks.  To help prevent asthma attacks:  Do not smoke. Stay away from secondhand smoke.  Change your heating and air conditioning filter  often.  Limit your use of fireplaces and wood stoves.  Get rid of pests (such as roaches and mice) and their droppings.  Throw away plants if you see mold on them.  Clean your floors. Dust regularly. Use cleaning products that do not smell.  Have someone vacuum when you are not home. Use a vacuum cleaner with a HEPA filter if possible.  Replace carpet with wood, tile, or vinyl flooring. Carpet can trap animal skin flakes and dust.  Use allergy-proof pillows, mattress covers, and box spring covers.  Wash bed sheets and blankets every week in hot water and dry them in a dryer.  Use blankets that are made of polyester or cotton.  Clean bathrooms and kitchens with bleach. If possible, have someone repaint the walls in these rooms with mold-resistant paint. Keep out of the rooms that are being cleaned and painted.  Wash hands often. GET HELP IF:  You have make a whistling sound when breaking (wheeze), have shortness of breath, or have a cough even if taking medicine to prevent attacks.  The colored mucus you cough up (sputum) is thicker than usual.  The colored mucus you cough up changes from clear or white to yellow, green, gray, or bloody.  You have problems from the medicine you are taking such as:  A rash.  Itching.  Swelling.  Trouble breathing.  You need reliever medicines more than 2-3 times a week.  Your peak flow measurement is still at 50-79% of your personal best after following the action plan for 1 hour.  You have a fever. GET HELP RIGHT AWAY  IF:   You seem to be worse and are not responding to medicine during an asthma attack.  You are short of breath even at rest.  You get short of breath when doing very little activity.  You have trouble eating, drinking, or talking.  You have chest pain.  You have a fast heartbeat.  Your lips or fingernails start to turn blue.  You are light-headed, dizzy, or faint.  Your peak flow is less than 50% of your  personal best. MAKE SURE YOU:   Understand these instructions.  Will watch your condition.  Will get help right away if you are not doing well or get worse. Document Released: 05/25/2008 Document Revised: 04/23/2014 Document Reviewed: 07/06/2013 [facility] Patient Information 2015 [place], [place]. This information is not intended to replace advice given to you by your health care provider. Make sure you discuss any questions you have with your health care provider.  Cough, Adult  A cough is a reflex that helps clear your throat and airways. It can help heal the body or may be a reaction to an irritated airway. A cough may only last 2 or 3 weeks (acute) or may last more than 8 weeks (chronic).  CAUSES Acute cough:  Viral or bacterial infections. Chronic cough:  Infections.  Allergies.  Asthma.  Post-nasal drip.  Smoking.  Heartburn or acid reflux.  Some medicines.  Chronic lung problems (COPD).  Cancer. SYMPTOMS   Cough.  Fever.  Chest pain.  Increased breathing rate.  High-pitched whistling sound when breathing (wheezing).  Colored mucus that you cough up (sputum). TREATMENT   A bacterial cough may be treated with antibiotic medicine.  A viral cough must run its course and will not respond to antibiotics.  Your caregiver may recommend other treatments if you have a chronic cough. HOME CARE INSTRUCTIONS   Only take over-the-counter or prescription medicines for pain, discomfort, or fever as directed by your caregiver. Use cough suppressants only as directed by your caregiver.  Use a cold steam vaporizer or humidifier in your bedroom or home to help loosen secretions.  Sleep in a semi-upright position if your cough is worse at night.  Rest as needed.  Stop smoking if you smoke. SEEK IMMEDIATE MEDICAL CARE IF:   You have pus in your sputum.  Your cough starts to worsen.  You cannot control your cough with suppressants and are losing sleep.  You  begin coughing up blood.  You have difficulty breathing.  You develop pain which is getting worse or is uncontrolled with medicine.  You have a fever. MAKE SURE YOU:   Understand these instructions.  Will watch your condition.  Will get help right away if you are not doing well or get worse. Document Released: 06/05/2011 Document Revised: 02/29/2012 Document Reviewed: 06/05/2011 [facility] Patient Information 2015 [place], [place]. This information is not intended to replace advice given to you by your health care provider. Make sure you discuss any questions you have with your health care provider.  Upper Respiratory Infection, Adult An upper respiratory infection (URI) is also sometimes known as the common cold. The upper respiratory tract includes the nose, sinuses, throat, trachea, and bronchi. Bronchi are the airways leading to the lungs. Most people improve within 1 week, but symptoms can last up to 2 weeks. A residual cough may last even longer.  CAUSES Many different viruses can infect the tissues lining the upper respiratory tract. The tissues become irritated and inflamed and often become very moist. Mucus production  is also common. A cold is contagious. You can easily spread the virus to others by oral contact. This includes kissing, sharing a glass, coughing, or sneezing. Touching your mouth or nose and then touching a surface, which is then touched by another person, can also spread the virus. SYMPTOMS  Symptoms typically develop 1 to 3 days after you come in contact with a cold virus. Symptoms vary from person to person. They may include:  Runny nose.  Sneezing.  Nasal congestion.  Sinus irritation.  Sore throat.  Loss of voice (laryngitis).  Cough.  Fatigue.  Muscle aches.  Loss of appetite.  Headache.  Low-grade fever. DIAGNOSIS  You might diagnose your own cold based on familiar symptoms, since most people get a cold 2 to 3 times a year. Your caregiver  can confirm this based on your exam. Most importantly, your caregiver can check that your symptoms are not due to another disease such as strep throat, sinusitis, pneumonia, asthma, or epiglottitis. Blood tests, throat tests, and X-rays are not necessary to diagnose a common cold, but they may sometimes be helpful in excluding other more serious diseases. Your caregiver will decide if any further tests are required. RISKS AND COMPLICATIONS  You may be at risk for a more severe case of the common cold if you smoke cigarettes, have chronic heart disease (such as heart failure) or lung disease (such as asthma), or if you have a weakened immune system. The very young and very old are also at risk for more serious infections. Bacterial sinusitis, middle ear infections, and bacterial pneumonia can complicate the common cold. The common cold can worsen asthma and chronic obstructive pulmonary disease (COPD). Sometimes, these complications can require emergency medical care and may be life-threatening. PREVENTION  The best way to protect against getting a cold is to practice good hygiene. Avoid oral or hand contact with people with cold symptoms. Wash your hands often if contact occurs. There is no clear evidence that vitamin C, vitamin E, echinacea, or exercise reduces the chance of developing a cold. However, it is always recommended to get plenty of rest and practice good nutrition. TREATMENT  Treatment is directed at relieving symptoms. There is no cure. Antibiotics are not effective, because the infection is caused by a virus, not by bacteria. Treatment may include:  Increased fluid intake. Sports drinks offer valuable electrolytes, sugars, and fluids.  Breathing heated mist or steam (vaporizer or shower).  Eating chicken soup or other clear broths, and maintaining good nutrition.  Getting plenty of rest.  Using gargles or lozenges for comfort.  Controlling fevers with ibuprofen or acetaminophen as  directed by your caregiver.  Increasing usage of your inhaler if you have asthma. Zinc gel and zinc lozenges, taken in the first 24 hours of the common cold, can shorten the duration and lessen the severity of symptoms. Pain medicines may help with fever, muscle aches, and throat pain. A variety of non-prescription medicines are available to treat congestion and runny nose. Your caregiver can make recommendations and may suggest nasal or lung inhalers for other symptoms.  HOME CARE INSTRUCTIONS   Only take over-the-counter or prescription medicines for pain, discomfort, or fever as directed by your caregiver.  Use a warm mist humidifier or inhale steam from a shower to increase air moisture. This may keep secretions moist and make it easier to breathe.  Drink enough water and fluids to keep your urine clear or pale yellow.  Rest as needed.  Return to work  when your temperature has returned to normal or as your caregiver advises. You may need to stay home longer to avoid infecting others. You can also use a face mask and careful hand washing to prevent spread of the virus. SEEK MEDICAL CARE IF:   After the first few days, you feel you are getting worse rather than better.  You need your caregiver's advice about medicines to control symptoms.  You develop chills, worsening shortness of breath, or brown or red sputum. These may be signs of pneumonia.  You develop yellow or brown nasal discharge or pain in the face, especially when you bend forward. These may be signs of sinusitis.  You develop a fever, swollen neck glands, pain with swallowing, or white areas in the back of your throat. These may be signs of strep throat. SEEK IMMEDIATE MEDICAL CARE IF:   You have a fever.  You develop severe or persistent headache, ear pain, sinus pain, or chest pain.  You develop wheezing, a prolonged cough, cough up blood, or have a change in your usual mucus (if you have chronic lung disease).  You  develop sore muscles or a stiff neck. Document Released: 06/02/2001 Document Revised: 02/29/2012 Document Reviewed: 03/14/2014 [facility] Patient Information 2015 [place], [place]. This information is not intended to replace advice given to you by your health care provider. Make sure you discuss any questions you have with your health care provider.

## 2015-06-11 NOTE — ED Notes (Signed)
Pt co sob, recent cold, dry cough, and sore throat. Also co right ear pain. Pt denies chest pain , H x asthma.

## 2015-06-13 ENCOUNTER — Ambulatory Visit (INDEPENDENT_AMBULATORY_CARE_PROVIDER_SITE_OTHER): Payer: 59 | Admitting: Family Medicine

## 2015-06-13 ENCOUNTER — Encounter: Payer: Self-pay | Admitting: Family Medicine

## 2015-06-13 VITALS — BP 128/77 | HR 96 | Temp 98.3°F | Ht 63.0 in | Wt 316.0 lb

## 2015-06-13 DIAGNOSIS — J0100 Acute maxillary sinusitis, unspecified: Secondary | ICD-10-CM | POA: Diagnosis not present

## 2015-06-13 DIAGNOSIS — J329 Chronic sinusitis, unspecified: Secondary | ICD-10-CM | POA: Insufficient documentation

## 2015-06-13 MED ORDER — BENZONATATE 100 MG PO CAPS: 100.0000 mg | ORAL_CAPSULE | Freq: Three times a day (TID) | ORAL | Status: DC | PRN

## 2015-06-13 MED ORDER — AMOXICILLIN-POT CLAVULANATE 875-125 MG PO TABS: 1.0000 | ORAL_TABLET | Freq: Two times a day (BID) | ORAL | Status: DC

## 2015-06-13 NOTE — Assessment & Plan Note (Signed)
Patient with 7 days of sinus congestion that is not improving. Symptoms consistent with acute sinusitis, likely now bacterial given week of symptoms with no improvement. Normal lung sounds. Asthma exacerbation has been well treated to this point. No dyspnea or chest pain. Will treat with augmentin and tessalon. Advised to monitor CBGs while on prednisone and if >250 or <80 she is to let us know. She will follow-up with PCP after acute illness for her DM. Given return precautions.

## 2015-06-13 NOTE — Patient Instructions (Signed)
Nice to see you. I'm glad that your asthma issue is better. It sounds like you have a bacterial sinus infection. We will treat you with augmentin for this and tessalon for cough. If you develop chest pain, trouble breathing, fever, wheezing, elevated blood sugars, or low blood sugars please seek medical attention.

## 2015-06-13 NOTE — Progress Notes (Signed)
Patient ID: [name], female   DOB: May 26, 1976, 39 y.o.   MRN: [id number]  Tommi Rumps, MD Phone: [phone]  Anna Hawkins is a 39 y.o. female who presents today for same day appointment.   Patient notes that her asthma exacerbation has resolved. She has no chest pain, chest tightness, or shortness of breath. She notes sinus congestion that has been present for 7 days and is not getting better. Is maxillary congestion. Non productive cough. No fevers. Some post nasal drip. Ears are itching. Notes sick contacts with grandchild. Notes some jittery with albuterol treatments. Notes cbgs slightly higher than normal with prednisone - to 256 at highest.   PMH: nonsmoker.   ROS: Per HPI   Physical Exam Filed Vitals:   06/13/15 1547  BP:   Pulse: 96  Temp:     Gen: Well NAD HEENT: PERRL,  MMM, no OP erythema, normal TMs, nasal turbinate edema noted with no lesions, no cervical LAD Lungs: CTABL Nl WOB Heart: RRR, no mumur appreciated Exts: Non edematous BL  LE, warm and well perfused.    Assessment/Plan: Please see individual problem list.  Tommi Rumps, MD [place] PGY-3

## 2015-07-17 ENCOUNTER — Other Ambulatory Visit: Payer: Self-pay | Admitting: Family Medicine

## 2015-09-02 DIAGNOSIS — E119 Type 2 diabetes mellitus without complications: Secondary | ICD-10-CM | POA: Insufficient documentation

## 2015-09-10 ENCOUNTER — Other Ambulatory Visit: Payer: Self-pay | Admitting: General Surgery

## 2015-09-20 ENCOUNTER — Ambulatory Visit
Admission: RE | Admit: 2015-09-20 | Discharge: 2015-09-20 | Disposition: A | Discharge status: Home / Self Care | Payer: 59 | Source: Ambulatory Visit | Attending: General Surgery | Admitting: General Surgery

## 2015-11-06 ENCOUNTER — Other Ambulatory Visit: Payer: Self-pay | Admitting: Family Medicine

## 2015-12-03 ENCOUNTER — Encounter: Payer: Self-pay | Admitting: Family Medicine

## 2015-12-03 ENCOUNTER — Ambulatory Visit (INDEPENDENT_AMBULATORY_CARE_PROVIDER_SITE_OTHER): Payer: 59 | Admitting: Family Medicine

## 2015-12-03 VITALS — BP 95/52 | HR 108 | Temp 98.3°F | Ht 63.0 in | Wt 308.0 lb

## 2015-12-03 DIAGNOSIS — E119 Type 2 diabetes mellitus without complications: Secondary | ICD-10-CM

## 2015-12-03 DIAGNOSIS — Z131 Encounter for screening for diabetes mellitus: Secondary | ICD-10-CM

## 2015-12-03 DIAGNOSIS — Z23 Encounter for immunization: Secondary | ICD-10-CM | POA: Diagnosis not present

## 2015-12-03 LAB — POCT GLYCOSYLATED HEMOGLOBIN (HGB A1C): Hemoglobin A1C: 8.7

## 2015-12-03 MED ORDER — GLIPIZIDE ER 5 MG PO TB24: ORAL_TABLET | ORAL | Status: DC

## 2015-12-03 MED ORDER — ALBUTEROL SULFATE HFA 108 (90 BASE) MCG/ACT IN AERS: 1.0000 | INHALATION_SPRAY | RESPIRATORY_TRACT | Status: DC | PRN

## 2015-12-03 NOTE — Patient Instructions (Signed)
Thank you so much for coming to visit me today! I have sent refills of your medication to the pharmacy. I will also sent prescriptions for your glucometer, test strips, and lancets later today. If you have problems picking any of these up, please let me know. Please follow up in three months so we can recheck your A1C and consider adjusting medications at that time!  Thanks again! Dr. Gerlean Ren

## 2015-12-04 MED ORDER — GLUCOSE BLOOD VI STRP: ORAL_STRIP | Status: AC

## 2015-12-04 MED ORDER — ACCU-CHEK MULTICLIX LANCETS MISC: Status: AC

## 2015-12-04 NOTE — Progress Notes (Signed)
Subjective:     Patient ID: Anna Hawkins, female   DOB: [birth year], 39 y.o.   MRN: [id number]  HPI Anna Hawkins is a 39yo female presenting today for diabetes follow up. - Has not been checking her blood sugars. States her glucometer broke three months ago. Requests prescription for glucometer, test strips, and lancets today. - Has been following with [facility]. Has been working on exercise and dieting with them, attempting to manage blood sugar. Seen by them once a month. Hopeful for gastric sleeve surgery in future, however must lose weight and have A1C less than 9.0. - Last A1C in our records 8.2 in March 2016. Reports that [facility] checked in September and it was 9.8. - Currently prescribed Glipizide 15mg  daily. Failed Metformin. - Takes Gabapentin 300mg  at night occasionally for nerve pain. States it works really well when she experiences pain. - Requests refill of Albuterol and Glipizide   - Medications reviewed and updated in Epic. Smoking status reviewed.  Review of Systems Per HPI    Objective:   Physical Exam  Constitutional: She appears well-developed and well-nourished. No distress.  HENT:  Head: Normocephalic and atraumatic.  Cardiovascular: Normal rate and regular rhythm.  Exam reveals no gallop and no friction rub.   No murmur heard. Pulmonary/Chest: Effort normal. No respiratory distress. She has no wheezes. She has no rales.  Abdominal: Soft. She exhibits no distension. There is no tenderness.  Musculoskeletal: She exhibits no edema.  Psychiatric: She has a normal mood and affect. Her behavior is normal.       Assessment and Plan:     Diabetes - A1C 8.2 in March 2016 as noted in [place]. Reports A1C at [facility] of 9.8 in September 2016. Improved today from reported value at 8.7. - Anna Hawkins would prefer to continue to work on aggressive diet and exercise measures to lower A1C. Offered addition of Invokana to help with weight loss  and blood sugar control, but declined. Following with Bariatric Surgery at [facility] once a month with ultimate goal of undergoing gastric sleeve. - Refill of Glipizide given - Prescription for glucometer, test strips, and lancets sent to pharmacy.  - Recommend buying notebook to record blood sugars at least twice a day, once fasting and once after largest meal of day. - Follow up in three months for A1C check. If no significant improvement will continue to offer additional medications to manage diabetes. To bring blood sugar log to next visit.

## 2015-12-04 NOTE — Assessment & Plan Note (Addendum)
-   A1C 8.2 in March 2016 as noted in [place]. Reports A1C at [facility] of 9.8 in September 2016. Improved today from reported value at 8.7. - Mrs. Churchwell would prefer to continue to work on aggressive diet and exercise measures to lower A1C. Offered addition of Invokana to help with weight loss and blood sugar control, but declined. Following with Bariatric Surgery at [facility] once a month with ultimate goal of undergoing gastric sleeve. - Refill of Glipizide given - Prescription for glucometer, test strips, and lancets sent to pharmacy.  - Recommend buying notebook to record blood sugars at least twice a day, once fasting and once after largest meal of day. - Follow up in three months for A1C check. If no significant improvement will continue to offer additional medications to manage diabetes. To bring blood sugar log to next visit.

## 2016-02-03 ENCOUNTER — Ambulatory Visit (INDEPENDENT_AMBULATORY_CARE_PROVIDER_SITE_OTHER): Payer: 59 | Admitting: Family Medicine

## 2016-02-03 ENCOUNTER — Encounter: Payer: Self-pay | Admitting: Family Medicine

## 2016-02-03 VITALS — BP 118/72 | HR 89 | Temp 97.7°F | Wt 303.0 lb

## 2016-02-03 DIAGNOSIS — J309 Allergic rhinitis, unspecified: Secondary | ICD-10-CM | POA: Diagnosis not present

## 2016-02-03 MED ORDER — FLUTICASONE PROPIONATE 50 MCG/ACT NA SUSP: 2.0000 | Freq: Every day | NASAL | Status: DC

## 2016-02-03 MED ORDER — FEXOFENADINE HCL 180 MG PO TABS: 180.0000 mg | ORAL_TABLET | Freq: Every day | ORAL | Status: DC

## 2016-02-03 NOTE — Patient Instructions (Signed)
Allergic Rhinitis Allergic rhinitis is when the mucous membranes in the nose respond to allergens. Allergens are particles in the air that cause your body to have an allergic reaction. This causes you to release allergic antibodies. Through a chain of events, these eventually cause you to release histamine into the blood stream. Although meant to protect the body, it is this release of histamine that causes your discomfort, such as frequent sneezing, congestion, and an itchy, runny nose.  CAUSES Seasonal allergic rhinitis (hay fever) is caused by pollen allergens that may come from grasses, trees, and weeds. Year-round allergic rhinitis (perennial allergic rhinitis) is caused by allergens such as house dust mites, pet dander, and mold spores. SYMPTOMS  Nasal stuffiness (congestion).  Itchy, runny nose with sneezing and tearing of the eyes. DIAGNOSIS Your health care provider can help you determine the allergen or allergens that trigger your symptoms. If you and your health care provider are unable to determine the allergen, skin or blood testing may be used. Your health care provider will diagnose your condition after taking your health history and performing a physical exam. Your health care provider may assess you for other related conditions, such as asthma, pink eye, or an ear infection. TREATMENT Allergic rhinitis does not have a cure, but it can be controlled by:  Medicines that block allergy symptoms. These may include allergy shots, nasal sprays, and oral antihistamines.  Avoiding the allergen. Hay fever may often be treated with antihistamines in pill or nasal spray forms. Antihistamines block the effects of histamine. There are over-the-counter medicines that may help with nasal congestion and swelling around the eyes. Check with your health care provider before taking or giving this medicine. If avoiding the allergen or the medicine prescribed do not work, there are many new medicines  your health care provider can prescribe. Stronger medicine may be used if initial measures are ineffective. Desensitizing injections can be used if medicine and avoidance does not work. Desensitization is when a patient is given ongoing shots until the body becomes less sensitive to the allergen. Make sure you follow up with your health care provider if problems continue. HOME CARE INSTRUCTIONS It is not possible to completely avoid allergens, but you can reduce your symptoms by taking steps to limit your exposure to them. It helps to know exactly what you are allergic to so that you can avoid your specific triggers. SEEK MEDICAL CARE IF:  You have a fever.  You develop a cough that does not stop easily (persistent).  You have shortness of breath.  You start wheezing.  Symptoms interfere with normal daily activities.   This information is not intended to replace advice given to you by your health care provider. Make sure you discuss any questions you have with your health care provider.   Document Released: 09/01/2001 Document Revised: 12/28/2014 Document Reviewed: 08/14/2013 Elsevier Interactive Patient Education 2016 [employer]  

## 2016-02-03 NOTE — Progress Notes (Signed)
   Subjective:   Anna Hawkins is a 40 y.o. female with a history of  HTN, diabetes , obesity here for   Same day appointment for Cough and nasal congestion  Rhinorrhea, congestion 3 weeks ago head cold with congestion - better last week Last week GI bug with diarrhea This AM - clear rhinorrhea, dry itchy cough, post-nasal drip Tried flonase last night and didn't help Takes Zyrtec daily as well - doesn't think this has helped in the past Trouble with allergic rhinitis in the past and this feels like that Little bit of wheezing and SOB at times No fevers  Review of Systems:  Per HPI. All other systems reviewed and are negative.   PMH, PSH, Medications, Allergies, and FmHx reviewed and updated in EMR.  Social History: never smoker  Objective:  BP 118/72 mmHg  Pulse 89  Temp(Src) 97.7 F (36.5 C) (Oral)  Wt 303 lb (137.44 kg)  LMP 01/27/2016  Gen:  40 y.o. female in NAD HEENT: NCAT, MMM, EOMI, PERRL, anicteric sclerae, OP clear, nasal turbinates inflammed, TMs appear to have fluid behind them, no erythema CV: RRR, no MRG Resp: Non-labored, CTAB, no wheezes noted Ext: WWP, no edema Neuro: Alert and oriented, speech normal     Assessment & Plan:     Anna Hawkins is a 40 y.o. female here for  Symptoms consistent with allergic rhinitis  Allergic rhinitis  Symptoms consistent with allergic rhinitis Advised patient to use Flonase regularly to see benefit Instructed patient on how to use Flonase properly  switch Zyrtec to Allegra to see if this will be more beneficial for the patient   return precautions discussed     Virginia Crews, MD MPH PGY-2,  [facility] 02/03/2016  3:26 PM

## 2016-02-03 NOTE — Assessment & Plan Note (Signed)
Symptoms consistent with allergic rhinitis Advised patient to use Flonase regularly to see benefit Instructed patient on how to use Flonase properly  switch Zyrtec to Allegra to see if this will be more beneficial for the patient   return precautions discussed

## 2016-03-23 ENCOUNTER — Encounter: Payer: Self-pay | Admitting: Family Medicine

## 2016-03-23 ENCOUNTER — Ambulatory Visit (INDEPENDENT_AMBULATORY_CARE_PROVIDER_SITE_OTHER): Payer: 59 | Admitting: Family Medicine

## 2016-03-23 VITALS — BP 138/72 | HR 104 | Temp 97.7°F | Ht 63.0 in | Wt 296.4 lb

## 2016-03-23 DIAGNOSIS — E119 Type 2 diabetes mellitus without complications: Secondary | ICD-10-CM

## 2016-03-23 DIAGNOSIS — R7989 Other specified abnormal findings of blood chemistry: Secondary | ICD-10-CM

## 2016-03-23 DIAGNOSIS — E0590 Thyrotoxicosis, unspecified without thyrotoxic crisis or storm: Secondary | ICD-10-CM

## 2016-03-23 DIAGNOSIS — R946 Abnormal results of thyroid function studies: Secondary | ICD-10-CM

## 2016-03-23 LAB — T3, FREE: T3, Free: 7 pg/mL — ABNORMAL HIGH (ref 2.3–4.2)

## 2016-03-23 LAB — T4, FREE: FREE T4: 2.9 ng/dL — AB (ref 0.8–1.8)

## 2016-03-23 LAB — POCT GLYCOSYLATED HEMOGLOBIN (HGB A1C): Hemoglobin A1C: 8.8

## 2016-03-23 MED ORDER — CANAGLIFLOZIN 100 MG PO TABS: 100.0000 mg | ORAL_TABLET | Freq: Every day | ORAL | Status: DC

## 2016-03-23 NOTE — Patient Instructions (Signed)
Thank you so much for coming to visit today! - I have sent a prescription for Invokana to the pharmacy. I have included information concerning this medication below. - We will check several other labs before starting thyroid medication. I will let you know the results - I have ordered an ultrasound of your thyroid. This will be scheduled before you leave today. The nurse will let you know the date/time. - Follow up pending thyroid tests.  Thanks again! Dr. Gerlean Ren  Canagliflozin oral tablets What is this medicine? CANAGLIFLOZIN (KAN a gli FLOE zin) helps to treat type 2 diabetes. It helps to control blood sugar. Treatment is combined with diet and exercise. This medicine may be used for other purposes; ask your health care provider or pharmacist if you have questions. How should I use this medicine? Take this medicine by mouth with a glass of water. Follow the directions on the prescription label. Take it before the first meal of the day. Take your dose at the same time each day. Do not take more often than directed. Do not stop taking except on your doctor's advice. A special MedGuide will be given to you by the pharmacist with each prescription and refill. Be sure to read this information carefully each time. Talk to your pediatrician regarding the use of this medicine in children. Special care may be needed. Overdosage: If you think you have taken too much of this medicine contact a poison control center or emergency room at once. NOTE: This medicine is only for you. Do not share this medicine with others. What if I miss a dose? If you miss a dose, take it as soon as you can. If it is almost time for your next dose, take only that dose. Do not take double or extra doses. What should I watch for while using this medicine? Visit your doctor or health care professional for regular checks on your progress. This medicine can cause a serious condition in which there is too much acid in the blood.  If you develop nausea, vomiting, stomach pain, unusual tiredness, or breathing problems, stop taking this medicine and call your doctor right away. If possible, use a ketone dipstick to check for ketones in your urine. A test called the HbA1C (A1C) will be monitored. This is a simple blood test. It measures your blood sugar control over the last 2 to 3 months. You will receive this test every 3 to 6 months. Learn how to check your blood sugar. Learn the symptoms of low and high blood sugar and how to manage them. Always carry a quick-source of sugar with you in case you have symptoms of low blood sugar. Examples include hard sugar candy or glucose tablets. Make sure others know that you can choke if you eat or drink when you develop serious symptoms of low blood sugar, such as seizures or unconsciousness. They must get medical help at once. Tell your doctor or health care professional if you have high blood sugar. You might need to change the dose of your medicine. If you are sick or exercising more than usual, you might need to change the dose of your medicine. Do not skip meals. Ask your doctor or health care professional if you should avoid alcohol. Many nonprescription cough and cold products contain sugar or alcohol. These can affect blood sugar. Wear a medical ID bracelet or chain, and carry a card that describes your disease and details of your medicine and dosage times. What side effects may  I notice from receiving this medicine? Side effects that you should report to your doctor or health care professional as soon as possible: -allergic reactions like skin rash, itching or hives, swelling of the face, lips, or tongue -breathing problems -chest pain -dizziness -fast or irregular heartbeat -feeling faint or lightheaded, falls -muscle weakness -nausea, vomiting, unusual stomach upset or pain -new pain or tenderness, change in skin color, sores or ulcers, or infection in legs or feet -signs  and symptoms of low blood sugar such as feeling anxious, confusion, dizziness, increased hunger, unusually weak or tired, sweating, shakiness, cold, irritable, headache, blurred vision, fast heartbeat, loss of consciousness -signs and symptoms of a urinary tract infection, such as fever, chills, a burning feeling when urinating, blood in the urine, back pain -trouble passing urine or change in the amount of urine, including an urgent need to urinate more often, in larger amounts, or at night -penile discharge, itching, or pain in men -unusual tiredness -vaginal discharge, itching, or odor in women Side effects that usually do not require medical attention (Report these to your doctor or health care professional if they continue or are bothersome.): -constipation -mild increase in urination -thirsty This list may not describe all possible side effects. Call your doctor for medical advice about side effects. You may report side effects to FDA at 1-800-FDA-1088. Where should I keep my medicine? Keep out of the reach of children. Store at room temperature between 20 and 25 degrees C (68 and 77 degrees F). Throw away any unused medicine after the expiration date. NOTE: This sheet is a summary. It may not cover all possible information. If you have questions about this medicine, talk to your doctor, pharmacist, or health care provider.    2016, Elsevier/Gold Standard. (2015-05-08 14:33:36)

## 2016-03-24 LAB — THYROID PEROXIDASE ANTIBODY: Thyroperoxidase Ab SerPl-aCnc: 2 IU/mL (ref <9)

## 2016-03-25 ENCOUNTER — Telehealth: Payer: Self-pay | Admitting: Family Medicine

## 2016-03-25 DIAGNOSIS — E0590 Thyrotoxicosis, unspecified without thyrotoxic crisis or storm: Secondary | ICD-10-CM

## 2016-03-25 NOTE — Progress Notes (Signed)
Subjective:     Patient ID: Anna Hawkins, female   DOB: Feb 13, 1976, 40 y.o.   MRN: [id number]  HPI Anna Hawkins is a 40yo female presenting for diabetes follow up and to discuss thyroid. - Has been meeting with bariatrics and working with them to lose weight so she can undergo gastric sleeve surgery. States at last visit they checked her thyroid and it was abnormal. Wanted follow up with PCP before surgery could proceed. - Last TSH in Epic normal at 0.689 - Care Everywhere shows TSH of 0.030 in 02/2016 - Reports diarrhea, always hot. Denies changes in anxiety, skin quality, hair quality - Was initially reluctant to try another diabetes agent. Now after a few months of trying weight loss, is amenable to starting another agent - Never Smoker  Review of Systems Per HPI. Other systems negative.    Objective:   Physical Exam  Constitutional: She appears well-developed and well-nourished. No distress.  HENT:  Head: Normocephalic and atraumatic.  Neck: Thyromegaly present.  Cardiovascular: Normal rate and regular rhythm.  Exam reveals no gallop and no friction rub.   No murmur heard. Pedal pulses palpable  Pulmonary/Chest: Effort normal. No respiratory distress. She has no wheezes. She has no rales.  Abdominal: Soft. She exhibits no distension. There is no tenderness.  Neurological:  Sensation intact over feet bilaterally  Skin: Skin is warm. No rash noted.  No callus formation or ulcers noted along feet bilaterally  Psychiatric: She has a normal mood and affect. Her behavior is normal.      Assessment and Plan:     Diabetes - A1C 8.8 today, mostly unchanged from last A1C of 8.7 - Continue Glipizide 15mg  - Initiate Invokana. Benefits/risks discussed. - Continue to work on diet and exercise. Following with Bariatrics with end goal of obtaining gastric sleeve. - Follow up in 49months  Hyperthyroidism - Suspected with TSH of 0.030 noted  - Will obtain T3, T4, and TPO - Given  goiter, will obtain thyroid ultrasound - Follow up pending results. Anticipate the need for medication pending lab results.

## 2016-03-25 NOTE — Telephone Encounter (Signed)
Attempted to contact concerning lab results x2 without response.  Results consistent with hyperthyroidism, which we discussed and suspected at last office visit. Results are inconclusive concerning cause. I have ordered one more test, which she can schedule a lab appointment to come and have drawn. I have also placed a referral to Endocrinology for further diagnosis and treatment.

## 2016-03-25 NOTE — Assessment & Plan Note (Signed)
-   A1C 8.8 today, mostly unchanged from last A1C of 8.7 - Continue Glipizide 15mg  - Initiate Invokana. Benefits/risks discussed. - Continue to work on diet and exercise. Following with Bariatrics with end goal of obtaining gastric sleeve. - Follow up in 31months

## 2016-03-25 NOTE — Assessment & Plan Note (Signed)
-   Suspected with TSH of 0.030 noted  - Will obtain T3, T4, and TPO - Given goiter, will obtain thyroid ultrasound - Follow up pending results. Anticipate the need for medication pending lab results.

## 2016-03-26 NOTE — Telephone Encounter (Signed)
Patient informed of message from MD, lab appointment scheduled.

## 2016-04-02 ENCOUNTER — Other Ambulatory Visit: Payer: 59

## 2016-04-02 ENCOUNTER — Ambulatory Visit (HOSPITAL_COMMUNITY)
Admission: RE | Admit: 2016-04-02 | Discharge: 2016-04-02 | Disposition: A | Discharge status: Home / Self Care | Payer: 59 | Source: Ambulatory Visit | Attending: Family Medicine | Admitting: Family Medicine

## 2016-04-02 DIAGNOSIS — R946 Abnormal results of thyroid function studies: Secondary | ICD-10-CM | POA: Insufficient documentation

## 2016-04-02 DIAGNOSIS — E042 Nontoxic multinodular goiter: Secondary | ICD-10-CM | POA: Insufficient documentation

## 2016-04-02 DIAGNOSIS — R7989 Other specified abnormal findings of blood chemistry: Secondary | ICD-10-CM

## 2016-04-02 DIAGNOSIS — E0590 Thyrotoxicosis, unspecified without thyrotoxic crisis or storm: Secondary | ICD-10-CM

## 2016-04-02 NOTE — Progress Notes (Signed)
Labs done today Anna Hawkins 

## 2016-04-06 LAB — THYROTROPIN RECEPTOR AUTOABS: Thyrotropin Receptor Ab: 45.3 % — ABNORMAL HIGH (ref <=16.0)

## 2016-04-07 ENCOUNTER — Telehealth: Payer: Self-pay | Admitting: Family Medicine

## 2016-04-07 NOTE — Telephone Encounter (Signed)
Contacted concerning lab results indicating presence of suspected Graves Disease. Encouraged to follow up with Endocrinology for further management as scheduled.

## 2016-04-11 ENCOUNTER — Ambulatory Visit (HOSPITAL_COMMUNITY)
Admission: EM | Admit: 2016-04-11 | Discharge: 2016-04-11 | Disposition: A | Discharge status: Home / Self Care | Payer: 59 | Attending: Emergency Medicine | Admitting: Emergency Medicine

## 2016-04-11 ENCOUNTER — Encounter (HOSPITAL_COMMUNITY): Payer: Self-pay | Admitting: Emergency Medicine

## 2016-04-11 DIAGNOSIS — L738 Other specified follicular disorders: Secondary | ICD-10-CM

## 2016-04-11 DIAGNOSIS — L0102 Bockhart's impetigo: Secondary | ICD-10-CM

## 2016-04-11 MED ORDER — DOXYCYCLINE HYCLATE 100 MG PO CAPS: 100.0000 mg | ORAL_CAPSULE | Freq: Two times a day (BID) | ORAL | Status: DC

## 2016-04-11 NOTE — Discharge Instructions (Signed)
Folliculitis °Folliculitis is redness, soreness, and swelling (inflammation) of the hair follicles. This condition can occur anywhere on the body. People with weakened immune systems, diabetes, or obesity have a greater risk of getting folliculitis. °CAUSES °· Bacterial infection. This is the most common cause. °· Fungal infection. °· Viral infection. °· Contact with certain chemicals, especially oils and tars. °Long-term folliculitis can result from bacteria that live in the nostrils. The bacteria may trigger multiple outbreaks of folliculitis over time. °SYMPTOMS °Folliculitis most commonly occurs on the scalp, thighs, legs, back, buttocks, and areas where hair is shaved frequently. An early sign of folliculitis is a small, white or yellow, pus-filled, itchy lesion (pustule). These lesions appear on a red, inflamed follicle. They are usually less than 0.2 inches (5 mm) wide. When there is an infection of the follicle that goes deeper, it becomes a boil or furuncle. A group of closely packed boils creates a larger lesion (carbuncle). Carbuncles tend to occur in hairy, sweaty areas of the body. °DIAGNOSIS  °Your caregiver can usually tell what is wrong by doing a physical exam. A sample may be taken from one of the lesions and tested in a lab. This can help determine what is causing your folliculitis. °TREATMENT  °Treatment may include: °· Applying warm compresses to the affected areas. °· Taking antibiotic medicines orally or applying them to the skin. °· Draining the lesions if they contain a large amount of pus or fluid. °· Laser hair removal for cases of long-lasting folliculitis. This helps to prevent regrowth of the hair. °HOME CARE INSTRUCTIONS °· Apply warm compresses to the affected areas as directed by your caregiver. °· If antibiotics are prescribed, take them as directed. Finish them even if you start to feel better. °· You may take over-the-counter medicines to relieve itching. °· Do not shave irritated  skin. °· Follow up with your caregiver as directed. °SEEK IMMEDIATE MEDICAL CARE IF:  °· You have increasing redness, swelling, or pain in the affected area. °· You have a fever. °MAKE SURE YOU: °· Understand these instructions. °· Will watch your condition. °· Will get help right away if you are not doing well or get worse. °  °This information is not intended to replace advice given to you by your health care provider. Make sure you discuss any questions you have with your health care provider. °  °Document Released: 02/15/2002 Document Revised: 12/28/2014 Document Reviewed: 03/08/2012 °Elsevier Interactive Patient Education ©2016 Elsevier Inc. ° °

## 2016-04-11 NOTE — ED Provider Notes (Signed)
CSN: [id number]     Arrival date & time 04/11/16  1831 History   First MD Initiated Contact with Patient 04/11/16 1856     Chief Complaint  Patient presents with  . Rash   (Consider location/radiation/quality/duration/timing/severity/associated sxs/prior Treatment) HPI Comments: 40 year old female complaining of painful tender bumps to the vertexand posterior aspect of the scalp. Try to approximately 3 days ago. It is associated with surrounding scalp tenderness right earache and pain just inferior to the angle of the jaw in the soft tissue spaces. Denies fever, chills or other systemic symptoms.  Patient is a 40 y.o. female presenting with rash.  Rash Associated symptoms: no fatigue and no fever     Past Medical History  Diagnosis Date  . Asthma   . Chlamydia   . Obesity   . Fatty liver   . Hx of shoulder dystocia, prior pregnancy, currently pregnant   . Pregnancy induced hypertension     diagnosed with pregnancy-started on aldomet-delivered 08/28/11  . Diabetes mellitus    Past Surgical History  Procedure Laterality Date  . Therapeutic abortion    . Wisdom tooth extraction    . Laparoscopic tubal ligation  10/29/2011    Procedure: LAPAROSCOPIC TUBAL LIGATION;  Surgeon: Mora Bellman, MD;  Location: [facility];  Service: Gynecology;  Laterality: Bilateral;  . Tubal ligation     Family History  Problem Relation Age of Onset  . Diabetes Mother   . Hypertension Mother   . Asthma Mother   . Diabetes Brother   . Hypertension Brother   . Asthma Brother   . Diabetes Father   . Cancer Father   . Asthma Father   . Hypertension Father    Social History  Substance Use Topics  . Smoking status: Never Smoker   . Smokeless tobacco: Never Used  . Alcohol Use: Yes     Comment: occasional   OB History    Gravida Para Term Preterm AB TAB SAB Ectopic Multiple Living   6 2 2  0 4 2 2  0 0 2     Review of Systems  Constitutional: Negative for fever, activity change and fatigue.   HENT: Positive for postnasal drip.   Respiratory: Negative.   Gastrointestinal: Negative.   Skin: Positive for wound. Negative for rash.    Allergies  Lisinopril  Home Medications   Prior to Admission medications   Medication Sig Start Date End Date Taking? Authorizing Provider  albuterol (PROVENTIL HFA;VENTOLIN HFA) 108 (90 BASE) MCG/ACT inhaler Inhale 1-2 puffs into the lungs every 4 (four) hours as needed for wheezing or shortness of breath. 12/03/15  Yes [place] N Rumley, DO  canagliflozin (INVOKANA) 100 MG TABS tablet Take 1 tablet (100 mg total) by mouth daily before breakfast. 03/23/16  Yes [place] N Rumley, DO  fexofenadine (ALLEGRA ALLERGY) 180 MG tablet Take 1 tablet (180 mg total) by mouth daily. 02/03/16  Yes Virginia Crews, MD  glipiZIDE (GLUCOTROL XL) 5 MG 24 hr tablet TAKE 3 TABLETS (15 MG TOTAL) BY MOUTH DAILY WITH BREAKFAST. 12/03/15  Yes [place] N Rumley, DO  losartan-hydrochlorothiazide (HYZAAR) 100-25 MG tablet TAKE 1 TABLET BY MOUTH DAILY. 11/06/15  Yes  N Rumley, DO  doxycycline (VIBRAMYCIN) 100 MG capsule Take 1 capsule (100 mg total) by mouth 2 (two) times daily. 04/11/16   Janne Napoleon, NP  fluticasone (FLONASE) 50 MCG/ACT nasal spray Place 2 sprays into both nostrils daily. 02/03/16   Virginia Crews, MD  gabapentin (NEURONTIN) 300 MG capsule Take 1  capsule (300 mg total) by mouth at bedtime. Patient not taking: Reported on 06/11/2015 03/04/15   [facility], DO  glucose blood test strip Use as instructed 12/04/15   Lorna Few, DO  Lancet Device MISC 1 Device by Does not apply route daily. 12/27/14   [place] Plants, DO  Lancets (ACCU-CHEK MULTICLIX) lancets Use as instructed 12/04/15   Lorna Few, DO   Meds Ordered and Administered this Visit  Medications - No data to display  BP 131/95 mmHg  Pulse 108  Temp(Src) 98.3 F (36.8 C) (Oral)  Resp 20  SpO2 100%  LMP 03/24/2016 No data found.   Physical Exam  Constitutional: She is  oriented to person, place, and time. She appears well-developed and well-nourished. No distress.  HENT:  Mouth/Throat: No oropharyngeal exudate.  Bilateral TMs are normal. No discoloration, no retraction or bulging. No effusion.Small amount of cerumen in each EAC, otherwise clear. Oropharynx with mild clear PND.  The vertex of the scalp has to identifiable tender pustules. There is a greasy materialover the scalp in which the patient has applied Neosporin ointment.No drainage. Positive for surrounding scalp tenderness. No palpable occipital nodes.  Eyes: Conjunctivae and EOM are normal.  Neck: Normal range of motion. Neck supple.  Minor tenderness just inferior to the right earlobe in the salt tissue space inferior to the angle of the jaw.  Cardiovascular: Normal rate.   Pulmonary/Chest: Effort normal.  Lymphadenopathy:    She has no cervical adenopathy.  Neurological: She is alert and oriented to person, place, and time. She exhibits normal muscle tone.  Skin: Skin is warm and dry.  Nursing note and vitals reviewed.   ED Course  Procedures (including critical care time)  Labs Review Labs Reviewed - No data to display  Imaging Review No results found.   Visual Acuity Review  Right Eye Distance:   Left Eye Distance:   Bilateral Distance:    Right Eye Near:   Left Eye Near:    Bilateral Near:         MDM   1. Pustular folliculitis    Warm compresses Meds ordered this encounter  Medications  . doxycycline (VIBRAMYCIN) 100 MG capsule    Sig: Take 1 capsule (100 mg total) by mouth 2 (two) times daily.    Dispense:  20 capsule    Refill:  0    Order Specific Question:  Supervising Provider    Answer:  Melony Overly [id number]       Janne Napoleon, NP 04/11/16 1924

## 2016-04-11 NOTE — ED Notes (Signed)
C/o bumps on back of scalp onset x3 days associated w/burnign, itching and pain and drainage Pain radiates to back of neck and ears... Has been applying neosporin w/no relief.  A&O x4... No acute distress.

## 2016-04-22 ENCOUNTER — Encounter: Payer: Self-pay | Admitting: Endocrinology

## 2016-04-22 ENCOUNTER — Ambulatory Visit (INDEPENDENT_AMBULATORY_CARE_PROVIDER_SITE_OTHER): Payer: 59 | Admitting: Endocrinology

## 2016-04-22 VITALS — BP 132/72 | HR 118 | Temp 98.0°F | Resp 14 | Ht 63.0 in | Wt 297.0 lb

## 2016-04-22 DIAGNOSIS — E0590 Thyrotoxicosis, unspecified without thyrotoxic crisis or storm: Secondary | ICD-10-CM | POA: Diagnosis not present

## 2016-04-22 LAB — T4, FREE: Free T4: 2.78 ng/dL — ABNORMAL HIGH (ref 0.60–1.60)

## 2016-04-22 LAB — TSH: TSH: 0.1 u[IU]/mL — ABNORMAL LOW (ref 0.35–4.50)

## 2016-04-22 MED ORDER — METHIMAZOLE 10 MG PO TABS: 20.0000 mg | ORAL_TABLET | Freq: Two times a day (BID) | ORAL | Status: DC

## 2016-04-22 NOTE — Patient Instructions (Addendum)
blood tests are requested for you today.  We'll let you know about the results.   i have sent a prescription to your pharmacy, to slow the thyroid.  if ever you have fever while taking methimazole, stop it and call us, even if the reason is obvious, because of the risk of a rare side-effect. Please come back for a follow-up appointment in 1 month. When the thyroid is better, we'll go ahead with the radioactive iodine.  It works like this:  let's check a thyroid "scan" (a special, but easy and painless type of thyroid x ray). you go to the x-ray department of the hospital to swallow a pill, which contains a miniscule amount of radiation.  You will not notice any symptoms from this.  You will go back to the x-ray department the next day, to lie down in front of a camera.  The results of this will be sent to me.   Based on the results, i hope to order for you a treatment pill of radioactive iodine.  Although it is a larger amount of radiation, you will again notice no symptoms from this.  The pill is gone from your body in a few days (during which you should stay away from other people), but takes several months to work.  Therefore, please return here approximately 6-8 weeks after the treatment.  This treatment has been available for many years, and the only known side-effect is an underactive thyroid.  It is possible that i would eventually prescribe for you a thyroid hormone pill, which is very inexpensive.  You don't have to worry about side-effects of this thyroid hormone pill, because it is the same molecule your thyroid makes.

## 2016-04-22 NOTE — Progress Notes (Signed)
Subjective:    Patient ID: Anna Hawkins, female    DOB: 09/25/1976, 40 y.o.   MRN: [id number]  HPI In evaluation for weight loss surgery, pt states few mos of slight tremor of the hands, and assoc fatigue.  She has no prior thyroid hx, except she was noted to have a goiter in 2002.  She has never been on therapy for this.  she has never had XRT to the anterior neck, or thyroid surgery.  she has never had thyroid imaging.  she does not consume kelp or any other non-prescribed thyroid medication.  She has had TL.  Past Medical History  Diagnosis Date  . Asthma   . Chlamydia   . Obesity   . Fatty liver   . Hx of shoulder dystocia, prior pregnancy, currently pregnant   . Pregnancy induced hypertension     diagnosed with pregnancy-started on aldomet-delivered 08/28/11  . Diabetes mellitus     Past Surgical History  Procedure Laterality Date  . Therapeutic abortion    . Wisdom tooth extraction    . Laparoscopic tubal ligation  10/29/2011    Procedure: LAPAROSCOPIC TUBAL LIGATION;  Surgeon: Mora Bellman, MD;  Location: [facility];  Service: Gynecology;  Laterality: Bilateral;  . Tubal ligation      Social History   Social History  . Marital Status: Single    Spouse Name: N/A  . Number of Children: N/A  . Years of Education: N/A   Occupational History  . Not on file.   Social History Main Topics  . Smoking status: Never Smoker   . Smokeless tobacco: Never Used  . Alcohol Use: Yes     Comment: occasional  . Drug Use: No  . Sexual Activity: Yes    Birth Control/ Protection: Surgical   Other Topics Concern  . Not on file   Social History Narrative    Current Outpatient Prescriptions on File Prior to Visit  Medication Sig Dispense Refill  . albuterol (PROVENTIL HFA;VENTOLIN HFA) 108 (90 BASE) MCG/ACT inhaler Inhale 1-2 puffs into the lungs every 4 (four) hours as needed for wheezing or shortness of breath. 18 g 4  . canagliflozin (INVOKANA) 100 MG TABS tablet Take 1  tablet (100 mg total) by mouth daily before breakfast. 90 tablet 3  . fexofenadine (ALLEGRA ALLERGY) 180 MG tablet Take 1 tablet (180 mg total) by mouth daily. 30 tablet 3  . fluticasone (FLONASE) 50 MCG/ACT nasal spray Place 2 sprays into both nostrils daily. 16 g 6  . gabapentin (NEURONTIN) 300 MG capsule Take 1 capsule (300 mg total) by mouth at bedtime. 90 capsule 3  . glucose blood test strip Use as instructed 100 each 12  . Lancet Device MISC 1 Device by Does not apply route daily. 90 each 3  . Lancets (ACCU-CHEK MULTICLIX) lancets Use as instructed 100 each 12  . losartan-hydrochlorothiazide (HYZAAR) 100-25 MG tablet TAKE 1 TABLET BY MOUTH DAILY. 30 tablet 7  . doxycycline (VIBRAMYCIN) 100 MG capsule Take 1 capsule (100 mg total) by mouth 2 (two) times daily. (Patient not taking: Reported on 04/22/2016) 20 capsule 0  . glipiZIDE (GLUCOTROL XL) 5 MG 24 hr tablet TAKE 3 TABLETS (15 MG TOTAL) BY MOUTH DAILY WITH BREAKFAST. (Patient not taking: Reported on 04/22/2016) 90 tablet 3   No current facility-administered medications on file prior to visit.    Allergies  Allergen Reactions  . Lisinopril Nausea Only, Rash and Cough    Family History  Problem Relation Age  of Onset  . Diabetes Mother   . Hypertension Mother   . Asthma Mother   . Diabetes Brother   . Hypertension Brother   . Asthma Brother   . Diabetes Father   . Cancer Father   . Asthma Father   . Hypertension Father   . Thyroid disease Mother     BP 132/72 mmHg  Pulse 118  Temp(Src) 98 F (36.7 C) (Oral)  Resp 14  Ht 5\' 3"  (1.6 m)  Wt 297 lb (134.718 kg)  BMI 52.62 kg/m2  SpO2 97%  LMP 03/24/2016  Review of Systems denies weight loss, headache, hoarseness, visual loss, polyuria, muscle weakness, excessive diaphoresis, numbness, anxiety, heat intolerance, easy bruising, and rhinorrhea.  She has intermitt palpitations, diarrhea, and doe.     Objective:   Physical Exam VS: see vs page GEN: no distress HEAD:  head: no deformity eyes: no periorbital swelling, no proptosis external nose and ears are normal mouth: no lesion seen NECK: thyroid is approx twice normal size, diffuse CHEST WALL: no deformity LUNGS: clear to auscultation CV: reg rate and rhythm, no murmur ABD: abdomen is soft, nontender.  no hepatosplenomegaly.  not distended.  no hernia.   MUSCULOSKELETAL: muscle bulk and strength are grossly normal.  no obvious joint swelling.  gait is normal and steady EXTEMITIES: no deformity.  no ulcer on the feet.  feet are of normal color and temp.  1+ bilat leg edema PULSES: dorsalis pedis intact bilat.  no carotid bruit NEURO:  cn 2-12 grossly intact.   readily moves all 4's.  sensation is intact to touch on the feet SKIN:  Normal texture and temperature.  No rash or suspicious lesion is visible.  No tremor NODES:  None palpable at the neck PSYCH: alert, well-oriented.  Does not appear anxious nor depressed.  Lab Results  Component Value Date   TSH 0.10* 04/22/2016  free T4=2.78  I have reviewed outside records, and summarized: Pt was noted to have hyperthyroidism, and referred here.     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   Patient is advised the following: Patient Instructions  blood tests are requested for you today.  We'll let you know about the results.   i have sent a prescription to your pharmacy, to slow the thyroid.  if ever you have fever while taking methimazole, stop it and call us, even if the reason is obvious, because of the risk of a rare side-effect. Please come back for a follow-up appointment in 1 month. When the thyroid is better, we'll go ahead with the radioactive iodine.  It works like this:  let's check a thyroid "scan" (a special, but easy and painless type of thyroid x ray). you go to the x-ray department of the hospital to swallow a pill, which contains a  miniscule amount of radiation.  You will not notice any symptoms from this.  You will go back to the x-ray department the next day, to lie down in front of a camera.  The results of this will be sent to me.   Based on the results, i hope to order for you a treatment pill of radioactive iodine.  Although it is a larger amount of radiation, you will again notice no symptoms from this.  The pill is gone from your body in a few days (during which you should stay away from other people), but takes several months  to work.  Therefore, please return here approximately 6-8 weeks after the treatment.  This treatment has been available for many years, and the only known side-effect is an underactive thyroid.  It is possible that i would eventually prescribe for you a thyroid hormone pill, which is very inexpensive.  You don't have to worry about side-effects of this thyroid hormone pill, because it is the same molecule your thyroid makes.    addendum: d/c biotin.

## 2016-05-03 ENCOUNTER — Encounter (HOSPITAL_COMMUNITY): Payer: Self-pay | Admitting: *Deleted

## 2016-05-03 ENCOUNTER — Ambulatory Visit (HOSPITAL_COMMUNITY)
Admission: EM | Admit: 2016-05-03 | Discharge: 2016-05-03 | Disposition: A | Discharge status: Home / Self Care | Payer: 59 | Attending: Emergency Medicine | Admitting: Emergency Medicine

## 2016-05-03 DIAGNOSIS — K219 Gastro-esophageal reflux disease without esophagitis: Secondary | ICD-10-CM | POA: Diagnosis not present

## 2016-05-03 DIAGNOSIS — R070 Pain in throat: Secondary | ICD-10-CM

## 2016-05-03 HISTORY — DX: Personal history of other complications of pregnancy, childbirth and the puerperium: Z87.59

## 2016-05-03 MED ORDER — PANTOPRAZOLE SODIUM 20 MG PO TBEC: 20.0000 mg | DELAYED_RELEASE_TABLET | Freq: Every day | ORAL | Status: DC

## 2016-05-03 MED ORDER — GI COCKTAIL ~~LOC~~
30.0000 mL | Freq: Once | ORAL | Status: AC
  Administered 2016-05-03: 30 mL via ORAL

## 2016-05-03 MED ORDER — GI COCKTAIL ~~LOC~~
ORAL | Status: AC
  Filled 2016-05-03: qty 30

## 2016-05-03 NOTE — ED Provider Notes (Signed)
CSN: [id number]     Arrival date & time 05/03/16  1924 History   First MD Initiated Contact with Patient 05/03/16 1934     Chief Complaint  Patient presents with  . Abdominal Pain  . Sore Throat   (Consider location/radiation/quality/duration/timing/severity/associated sxs/prior Treatment) HPI She is a 40 year old woman here for evaluation of throat and epigastric pain. She states this started yesterday after eating some pineapple pieces. She states a pineapple piece seemed to get stuck and she coughed it up.  Since that time she has had discomfort any foreign body sensation in her throat. She is able to drink liquids without difficulty. When she tries to eat solid food she feels like it gets stuck in her throat. She also reports epigastric pain since this episode, this primarily occurs when she tries to eat solid food. She has had several small episodes of loose stool. There has been no blood in it. She's had some nausea, but no vomiting.  She tried some Zantac without improvement.  Past Medical History  Diagnosis Date  . Asthma   . Chlamydia   . Obesity   . Fatty liver   . Pregnancy induced hypertension     diagnosed with pregnancy-started on aldomet-delivered 08/28/11  . Diabetes mellitus   . History of shoulder dystocia in prior pregnancy    Past Surgical History  Procedure Laterality Date  . Therapeutic abortion    . Wisdom tooth extraction    . Laparoscopic tubal ligation  10/29/2011    Procedure: LAPAROSCOPIC TUBAL LIGATION;  Surgeon: Mora Bellman, MD;  Location: [facility];  Service: Gynecology;  Laterality: Bilateral;   Family History  Problem Relation Age of Onset  . Diabetes Mother   . Hypertension Mother   . Asthma Mother   . Diabetes Brother   . Hypertension Brother   . Asthma Brother   . Diabetes Father   . Cancer Father   . Asthma Father   . Hypertension Father   . Thyroid disease Mother    Social History  Substance Use Topics  . Smoking status: Never Smoker    . Smokeless tobacco: Never Used  . Alcohol Use: No   OB History    Gravida Para Term Preterm AB TAB SAB Ectopic Multiple Living   6 2 2  0 4 2 2  0 0 2     Review of Systems As in history of present illness Allergies  Lisinopril  Home Medications   Prior to Admission medications   Medication Sig Start Date End Date Taking? Authorizing Provider  albuterol (PROVENTIL HFA;VENTOLIN HFA) 108 (90 BASE) MCG/ACT inhaler Inhale 1-2 puffs into the lungs every 4 (four) hours as needed for wheezing or shortness of breath. 12/03/15  Yes [place] N Rumley, DO  canagliflozin (INVOKANA) 100 MG TABS tablet Take 1 tablet (100 mg total) by mouth daily before breakfast. 03/23/16  Yes [place] N Rumley, DO  fexofenadine (ALLEGRA ALLERGY) 180 MG tablet Take 1 tablet (180 mg total) by mouth daily. 02/03/16  Yes Virginia Crews, MD  fluticasone (FLONASE) 50 MCG/ACT nasal spray Place 2 sprays into both nostrils daily. 02/03/16  Yes Virginia Crews, MD  gabapentin (NEURONTIN) 300 MG capsule Take 1 capsule (300 mg total) by mouth at bedtime. 03/04/15  Yes [place], DO  glucose blood test strip Use as instructed 12/04/15  Yes Lorna Few, DO  Lancet Device MISC 1 Device by Does not apply route daily. 12/27/14  Yes [place], DO  Lancets (ACCU-CHEK  MULTICLIX) lancets Use as instructed 12/04/15  Yes [place] N Rumley, DO  losartan-hydrochlorothiazide (HYZAAR) 100-25 MG tablet TAKE 1 TABLET BY MOUTH DAILY. 11/06/15  Yes [place] N Rumley, DO  methimazole (TAPAZOLE) 10 MG tablet Take 2 tablets (20 mg total) by mouth 2 (two) times daily. 04/22/16  Yes Renato Shin, MD  pantoprazole (PROTONIX) 20 MG tablet Take 1 tablet (20 mg total) by mouth daily. 05/03/16   Melony Overly, MD   Meds Ordered and Administered this Visit   Medications  gi cocktail (Maalox,Lidocaine,Donnatal) (30 mLs Oral Given 05/03/16 1952)    BP 143/86 mmHg  Pulse 94  Temp(Src) 98.7 F (37.1 C) (Oral)  Resp 16  SpO2 98%  LMP  05/02/2016 No data found.   Physical Exam  Constitutional: She is oriented to person, place, and time. She appears well-developed and well-nourished. No distress.  HENT:  Mouth/Throat: Oropharynx is clear and moist. No oropharyngeal exudate.  Cardiovascular: Normal rate, regular rhythm and normal heart sounds.   No murmur heard. Pulmonary/Chest: Effort normal and breath sounds normal. No respiratory distress. She has no wheezes. She has no rales.  Abdominal: Soft. Bowel sounds are normal. She exhibits no distension. There is no tenderness. There is no rebound and no guarding.  Neurological: She is alert and oriented to person, place, and time.    ED Course  Procedures (including critical care time)  Labs Review Labs Reviewed - No data to display  Imaging Review No results found.   MDM   1. Throat pain   2. Gastroesophageal reflux disease, esophagitis presence not specified    Symptoms improved after GI cocktail. Protonix for 2 weeks. Recommended liquids and soft foods for the next several days to let her throat recover. Follow-up as needed.    Melony Overly, MD 05/03/16 2013

## 2016-05-03 NOTE — ED Notes (Signed)
States was eating pineapple yesterday, and shortly after started with sensation of foreign body in there throat that has persisted.  Also started yesterday with epigastric pain, diarrhea (6 episodes since onset) without bloody stool, and nausea.  Denies fevers.  Has tried her albuterol HFA without any throat relief; has tried Zantac without any abd relief.

## 2016-05-03 NOTE — Discharge Instructions (Signed)
You likely have some bruising and swelling in your throat from where a piece of pineapple temporarily got stuck. This should gradually improve over the next several days. Take Protonix daily for the next 2 weeks to help with the stomach pain. Stick with liquids and soft foods for the next several days. Follow-up as needed.

## 2016-05-11 ENCOUNTER — Ambulatory Visit (HOSPITAL_COMMUNITY)
Admission: EM | Admit: 2016-05-11 | Discharge: 2016-05-11 | Disposition: A | Discharge status: Home / Self Care | Payer: 59 | Attending: Family Medicine | Admitting: Family Medicine

## 2016-05-11 ENCOUNTER — Encounter (HOSPITAL_COMMUNITY): Payer: Self-pay | Admitting: Emergency Medicine

## 2016-05-11 ENCOUNTER — Encounter: Payer: Self-pay | Admitting: Family Medicine

## 2016-05-11 DIAGNOSIS — N390 Urinary tract infection, site not specified: Secondary | ICD-10-CM | POA: Diagnosis not present

## 2016-05-11 DIAGNOSIS — E119 Type 2 diabetes mellitus without complications: Secondary | ICD-10-CM | POA: Diagnosis not present

## 2016-05-11 DIAGNOSIS — R81 Glycosuria: Secondary | ICD-10-CM | POA: Diagnosis not present

## 2016-05-11 LAB — POCT URINALYSIS DIP (DEVICE)
Bilirubin Urine: NEGATIVE
GLUCOSE, UA: 500 mg/dL — AB
HGB URINE DIPSTICK: NEGATIVE
Ketones, ur: NEGATIVE mg/dL
LEUKOCYTES UA: NEGATIVE
Nitrite: NEGATIVE
PH: 5.5 (ref 5.0–8.0)
Protein, ur: NEGATIVE mg/dL
UROBILINOGEN UA: 0.2 mg/dL (ref 0.0–1.0)

## 2016-05-11 MED ORDER — CEPHALEXIN 500 MG PO CAPS: 500.0000 mg | ORAL_CAPSULE | Freq: Four times a day (QID) | ORAL | Status: DC

## 2016-05-11 NOTE — ED Notes (Signed)
Patient has a history of uti.  Patient reports symptoms started yesterday.  Patient is having to go to toilet more often than usual.  Patient does have discomfort.

## 2016-05-11 NOTE — ED Provider Notes (Signed)
CSN: [id number]     Arrival date & time 05/11/16  1719 History   First MD Initiated Contact with Patient 05/11/16 1750     Chief Complaint  Patient presents with  . Urinary Tract Infection   (Consider location/radiation/quality/duration/timing/severity/associated sxs/prior Treatment) HPI Comments: 40 year old obese female with type 2 diabetes mellitus is complaining of dysuria and urinary frequency that began yesterday. Denies other associated symptoms.  Patient is a 40 y.o. female presenting with urinary tract infection.  Urinary Tract Infection Associated symptoms: no flank pain and no vaginal discharge     Past Medical History  Diagnosis Date  . Asthma   . Chlamydia   . Obesity   . Fatty liver   . Pregnancy induced hypertension     diagnosed with pregnancy-started on aldomet-delivered 08/28/11  . Diabetes mellitus   . History of shoulder dystocia in prior pregnancy    Past Surgical History  Procedure Laterality Date  . Therapeutic abortion    . Wisdom tooth extraction    . Laparoscopic tubal ligation  10/29/2011    Procedure: LAPAROSCOPIC TUBAL LIGATION;  Surgeon: Mora Bellman, MD;  Location: [facility];  Service: Gynecology;  Laterality: Bilateral;   Family History  Problem Relation Age of Onset  . Diabetes Mother   . Hypertension Mother   . Asthma Mother   . Diabetes Brother   . Hypertension Brother   . Asthma Brother   . Diabetes Father   . Cancer Father   . Asthma Father   . Hypertension Father   . Thyroid disease Mother    Social History  Substance Use Topics  . Smoking status: Never Smoker   . Smokeless tobacco: Never Used  . Alcohol Use: No   OB History    Gravida Para Term Preterm AB TAB SAB Ectopic Multiple Living   6 2 2  0 4 2 2  0 0 2     Review of Systems  Constitutional: Negative.   HENT: Negative.   Respiratory: Negative.   Genitourinary: Positive for dysuria and frequency. Negative for flank pain, vaginal discharge and menstrual problem.   Musculoskeletal: Negative.   Skin: Negative.   Neurological: Negative.     Allergies  Lisinopril  Home Medications   Prior to Admission medications   Medication Sig Start Date End Date Taking? Authorizing Provider  albuterol (PROVENTIL HFA;VENTOLIN HFA) 108 (90 BASE) MCG/ACT inhaler Inhale 1-2 puffs into the lungs every 4 (four) hours as needed for wheezing or shortness of breath. 12/03/15   [place] N Rumley, DO  canagliflozin (INVOKANA) 100 MG TABS tablet Take 1 tablet (100 mg total) by mouth daily before breakfast. 03/23/16   [place] N Rumley, DO  cephALEXin (KEFLEX) 500 MG capsule Take 1 capsule (500 mg total) by mouth 4 (four) times daily. 05/11/16   Janne Napoleon, NP  fexofenadine [facility] ALLERGY) 180 MG tablet Take 1 tablet (180 mg total) by mouth daily. 02/03/16   Virginia Crews, MD  fluticasone (FLONASE) 50 MCG/ACT nasal spray Place 2 sprays into both nostrils daily. 02/03/16   [place]ia Crews, MD  gabapentin (NEURONTIN) 300 MG capsule Take 1 capsule (300 mg total) by mouth at bedtime. 03/04/15   [place] N Rumley, DO  glucose blood test strip Use as instructed 12/04/15   Lorna Few, DO  Lancet Device MISC 1 Device by Does not apply route daily. 12/27/14    N [place], DO  Lancets (ACCU-CHEK MULTICLIX) lancets Use as instructed 12/04/15   Burna Cash [place], DO  losartan-hydrochlorothiazide [place])  100-25 MG tablet TAKE 1 TABLET BY MOUTH DAILY. 11/06/15   [place] N Rumley, DO  methimazole (TAPAZOLE) 10 MG tablet Take 2 tablets (20 mg total) by mouth 2 (two) times daily. 04/22/16   Renato Shin, MD  pantoprazole (PROTONIX) 20 MG tablet Take 1 tablet (20 mg total) by mouth daily. 05/03/16   Melony Overly, MD   Meds Ordered and Administered this Visit  Medications - No data to display  BP 145/83 mmHg  Pulse 109  Temp(Src) 98.2 F (36.8 C) (Oral)  Resp 16  SpO2 97%  LMP 05/02/2016 No data found.   Physical Exam  Constitutional: She is oriented to person, place, and  time. She appears well-developed and well-nourished. No distress.  Eyes: EOM are normal.  Neck: Normal range of motion. Neck supple.  Cardiovascular: Normal rate.   Pulmonary/Chest: Effort normal. No respiratory distress.  Musculoskeletal: She exhibits no edema.  Neurological: She is alert and oriented to person, place, and time. She exhibits normal muscle tone.  Skin: Skin is warm and dry.  Psychiatric: She has a normal mood and affect.  Nursing note and vitals reviewed.   ED Course  Procedures (including critical care time)  Labs Review Labs Reviewed  POCT URINALYSIS DIP (DEVICE) - Abnormal; Notable for the following:    Glucose, UA 500 (*)    All other components within normal limits    Imaging Review No results found.   Visual Acuity Review  Right Eye Distance:   Left Eye Distance:   Bilateral Distance:    Right Eye Near:   Left Eye Near:    Bilateral Near:         MDM   1. UTI (lower urinary tract infection)   2. Glycosuria   3. Type 2 diabetes mellitus without complication, without long-term current use of insulin ([place])    Meds ordered this encounter  Medications  . cephALEXin (KEFLEX) 500 MG capsule    Sig: Take 1 capsule (500 mg total) by mouth 4 (four) times daily.    Dispense:  28 capsule    Refill:  0    Order Specific Question:  Supervising Provider    Answer:  Ihor Gully D [[id number]]   [employer]    Janne Napoleon, NP 05/11/16 1816

## 2016-05-11 NOTE — Discharge Instructions (Signed)
Dysuria °Dysuria is pain or discomfort while urinating. The pain or discomfort may be felt in the tube that carries urine out of the bladder (urethra) or in the surrounding tissue of the genitals. The pain may also be felt in the groin area, lower abdomen, and lower back. You may have to urinate frequently or have the sudden feeling that you have to urinate (urgency). Dysuria can affect both men and women, but is more common in women. °Dysuria can be caused by many different things, including: °· Urinary tract infection in women. °· Infection of the kidney or bladder. °· Kidney stones or bladder stones. °· Certain sexually transmitted infections (STIs), such as chlamydia. °· Dehydration. °· Inflammation of the vagina. °· Use of certain medicines. °· Use of certain soaps or scented products that cause irritation. °HOME CARE INSTRUCTIONS °Watch your dysuria for any changes. The following actions may help to reduce any discomfort you are feeling: °· Drink enough fluid to keep your urine clear or pale yellow. °· Empty your bladder often. Avoid holding urine for long periods of time. °· After a bowel movement or urination, women should cleanse from front to back, using each tissue only once. °· Empty your bladder after sexual intercourse. °· Take medicines only as directed by your health care provider. °· If you were prescribed an antibiotic medicine, finish it all even if you start to feel better. °· Avoid caffeine, tea, and alcohol. They can irritate the bladder and make dysuria worse. In men, alcohol may irritate the prostate. °· Keep all follow-up visits as directed by your health care provider. This is important. °· If you had any tests done to find the cause of dysuria, it is your responsibility to obtain your test results. Ask the lab or department performing the test when and how you will get your results. Talk with your health care provider if you have any questions about your results. °SEEK MEDICAL CARE  IF: °· You develop pain in your back or sides. °· You have a fever. °· You have nausea or vomiting. °· You have blood in your urine. °· You are not urinating as often as you usually do. °SEEK IMMEDIATE MEDICAL CARE IF: °· You pain is severe and not relieved with medicines. °· You are unable to hold down any fluids. °· You or someone else notices a change in your mental function. °· You have a rapid heartbeat at rest. °· You have shaking or chills. °· You feel extremely weak. °  °This information is not intended to replace advice given to you by your health care provider. Make sure you discuss any questions you have with your health care provider. °  °Document Released: 09/04/2004 Document Revised: 12/28/2014 Document Reviewed: 08/02/2014 °Elsevier Interactive Patient Education ©2016 Elsevier Inc. ° °Urinary Tract Infection °Urinary tract infections (UTIs) can develop anywhere along your urinary tract. Your urinary tract is your body's drainage system for removing wastes and extra water. Your urinary tract includes two kidneys, two ureters, a bladder, and a urethra. Your kidneys are a pair of bean-shaped organs. Each kidney is about the size of your fist. They are located below your ribs, one on each side of your spine. °CAUSES °Infections are caused by microbes, which are microscopic organisms, including fungi, viruses, and bacteria. These organisms are so small that they can only be seen through a microscope. Bacteria are the microbes that most commonly cause UTIs. °SYMPTOMS  °Symptoms of UTIs may vary by age and gender of the patient   and by the location of the infection. Symptoms in young women typically include a frequent and intense urge to urinate and a painful, burning feeling in the bladder or urethra during urination. Older women and men are more likely to be tired, shaky, and weak and have muscle aches and abdominal pain. A fever may mean the infection is in your kidneys. Other symptoms of a kidney  infection include pain in your back or sides below the ribs, nausea, and vomiting. DIAGNOSIS To diagnose a UTI, your caregiver will ask you about your symptoms. Your caregiver will also ask you to provide a urine sample. The urine sample will be tested for bacteria and white blood cells. White blood cells are made by your body to help fight infection. TREATMENT  Typically, UTIs can be treated with medication. Because most UTIs are caused by a bacterial infection, they usually can be treated with the use of antibiotics. The choice of antibiotic and length of treatment depend on your symptoms and the type of bacteria causing your infection. HOME CARE INSTRUCTIONS  If you were prescribed antibiotics, take them exactly as your caregiver instructs you. Finish the medication even if you feel better after you have only taken some of the medication.  Drink enough water and fluids to keep your urine clear or pale yellow.  Avoid caffeine, tea, and carbonated beverages. They tend to irritate your bladder.  Empty your bladder often. Avoid holding urine for long periods of time.  Empty your bladder before and after sexual intercourse.  After a bowel movement, women should cleanse from front to back. Use each tissue only once. SEEK MEDICAL CARE IF:   You have back pain.  You develop a fever.  Your symptoms do not begin to resolve within 3 days. SEEK IMMEDIATE MEDICAL CARE IF:   You have severe back pain or lower abdominal pain.  You develop chills.  You have nausea or vomiting.  You have continued burning or discomfort with urination. MAKE SURE YOU:   Understand these instructions.  Will watch your condition.  Will get help right away if you are not doing well or get worse.   This information is not intended to replace advice given to you by your health care provider. Make sure you discuss any questions you have with your health care provider.   Document Released: 09/16/2005 Document  Revised: 08/28/2015 Document Reviewed: 01/15/2012 Elsevier Interactive Patient Education [employer].

## 2016-05-14 ENCOUNTER — Encounter: Payer: Self-pay | Admitting: Endocrinology

## 2016-05-15 ENCOUNTER — Ambulatory Visit: Payer: 59 | Admitting: Endocrinology

## 2016-05-16 ENCOUNTER — Other Ambulatory Visit: Payer: Self-pay | Admitting: Family Medicine

## 2016-05-18 ENCOUNTER — Emergency Department (HOSPITAL_COMMUNITY)
Admission: EM | Admit: 2016-05-18 | Discharge: 2016-05-19 | Disposition: A | Discharge status: Home / Self Care | Payer: 59 | Attending: Emergency Medicine | Admitting: Emergency Medicine

## 2016-05-18 ENCOUNTER — Encounter (HOSPITAL_COMMUNITY): Payer: Self-pay | Admitting: *Deleted

## 2016-05-18 DIAGNOSIS — J45909 Unspecified asthma, uncomplicated: Secondary | ICD-10-CM | POA: Diagnosis not present

## 2016-05-18 DIAGNOSIS — Z79899 Other long term (current) drug therapy: Secondary | ICD-10-CM | POA: Diagnosis not present

## 2016-05-18 DIAGNOSIS — R739 Hyperglycemia, unspecified: Secondary | ICD-10-CM

## 2016-05-18 DIAGNOSIS — E1165 Type 2 diabetes mellitus with hyperglycemia: Secondary | ICD-10-CM | POA: Insufficient documentation

## 2016-05-18 DIAGNOSIS — R112 Nausea with vomiting, unspecified: Secondary | ICD-10-CM | POA: Diagnosis present

## 2016-05-18 HISTORY — DX: Thyrotoxicosis with diffuse goiter without thyrotoxic crisis or storm: E05.00

## 2016-05-18 LAB — URINALYSIS, ROUTINE W REFLEX MICROSCOPIC
BILIRUBIN URINE: NEGATIVE
HGB URINE DIPSTICK: NEGATIVE
KETONES UR: NEGATIVE mg/dL
Leukocytes, UA: NEGATIVE
Nitrite: NEGATIVE
PROTEIN: NEGATIVE mg/dL
Specific Gravity, Urine: 1.043 — ABNORMAL HIGH (ref 1.005–1.030)
pH: 5.5 (ref 5.0–8.0)

## 2016-05-18 LAB — URINE MICROSCOPIC-ADD ON: RBC / HPF: NONE SEEN RBC/hpf (ref 0–5)

## 2016-05-18 LAB — COMPREHENSIVE METABOLIC PANEL
ALK PHOS: 111 U/L (ref 38–126)
ALT: 27 U/L (ref 14–54)
AST: 21 U/L (ref 15–41)
Albumin: 4.1 g/dL (ref 3.5–5.0)
Anion gap: 9 (ref 5–15)
BUN: 11 mg/dL (ref 6–20)
CALCIUM: 9.8 mg/dL (ref 8.9–10.3)
CHLORIDE: 100 mmol/L — AB (ref 101–111)
CO2: 27 mmol/L (ref 22–32)
CREATININE: 0.85 mg/dL (ref 0.44–1.00)
Glucose, Bld: 372 mg/dL — ABNORMAL HIGH (ref 65–99)
Potassium: 3.6 mmol/L (ref 3.5–5.1)
SODIUM: 136 mmol/L (ref 135–145)
Total Bilirubin: 0.9 mg/dL (ref 0.3–1.2)
Total Protein: 8.1 g/dL (ref 6.5–8.1)

## 2016-05-18 LAB — CBC
HCT: 38 % (ref 36.0–46.0)
Hemoglobin: 12.7 g/dL (ref 12.0–15.0)
MCH: 23.6 pg — AB (ref 26.0–34.0)
MCHC: 33.4 g/dL (ref 30.0–36.0)
MCV: 70.5 fL — AB (ref 78.0–100.0)
PLATELETS: 290 10*3/uL (ref 150–400)
RBC: 5.39 MIL/uL — AB (ref 3.87–5.11)
RDW: 13.5 % (ref 11.5–15.5)
WBC: 9.8 10*3/uL (ref 4.0–10.5)

## 2016-05-18 LAB — LIPASE, BLOOD: LIPASE: 29 U/L (ref 11–51)

## 2016-05-18 LAB — PREGNANCY, URINE: PREG TEST UR: NEGATIVE

## 2016-05-18 LAB — CBG MONITORING, ED: GLUCOSE-CAPILLARY: 310 mg/dL — AB (ref 65–99)

## 2016-05-18 LAB — POC URINE PREG, ED: PREG TEST UR: NEGATIVE

## 2016-05-18 MED ORDER — SODIUM CHLORIDE 0.9 % IV BOLUS (SEPSIS)
1000.0000 mL | Freq: Once | INTRAVENOUS | Status: AC
  Administered 2016-05-18: 1000 mL via INTRAVENOUS

## 2016-05-18 MED ORDER — ONDANSETRON HCL 4 MG/2ML IJ SOLN
4.0000 mg | Freq: Once | INTRAMUSCULAR | Status: AC
  Administered 2016-05-18: 4 mg via INTRAVENOUS
  Filled 2016-05-18: qty 2

## 2016-05-18 MED ORDER — KETOROLAC TROMETHAMINE 30 MG/ML IJ SOLN
30.0000 mg | Freq: Once | INTRAMUSCULAR | Status: AC
  Administered 2016-05-18: 30 mg via INTRAVENOUS
  Filled 2016-05-18: qty 1

## 2016-05-18 NOTE — ED Provider Notes (Signed)
CSN: [id number]     Arrival date & time 05/18/16  2048 History   First MD Initiated Contact with Patient 05/18/16 2147     Chief Complaint  Patient presents with  . Emesis   HPI  Ms. Larocca is a 40 year old female with PMHx of DM, asthma, graves disease presenting with nausea and vomiting. Pt reports she was recently diagnosed with Graves disease and started on methimazole. Ever since starting this medication, she has experienced nausea and vomiting. She states that she has been unable to keep any food down over the past week and a half. She denies associated abdominal pain, constipation or diarrhea. She states that she is now experiencing "hunger headaches" because she is unable to eat. She did not take her methimazole two days ago and reports improved symptoms. She has not tried any OTC symptom relievers. She has discussed symptoms with her endocrinologist and has appointment scheduled in one week to discuss side effects of her new medication. She endorses sensation of palpitations that she states is related to her Graves. She also notes that her blood pressure has been high recently. Reports compliance with her HTN medications. She also notes that her blood sugars have been running in the high 100s which is normal for her. Denies fevers, chills, dizziness, syncope, vision changes, URI symptoms, chest pain, SOB, dysuria, polyuria or rashes.   Past Medical History  Diagnosis Date  . Asthma   . Chlamydia   . Obesity   . Fatty liver   . Pregnancy induced hypertension     diagnosed with pregnancy-started on aldomet-delivered 08/28/11  . Diabetes mellitus   . History of shoulder dystocia in prior pregnancy   . Graves disease    Past Surgical History  Procedure Laterality Date  . Therapeutic abortion    . Wisdom tooth extraction    . Laparoscopic tubal ligation  10/29/2011    Procedure: LAPAROSCOPIC TUBAL LIGATION;  Surgeon: Mora Bellman, MD;  Location: [facility];  Service: Gynecology;   Laterality: Bilateral;   Family History  Problem Relation Age of Onset  . Diabetes Mother   . Hypertension Mother   . Asthma Mother   . Diabetes Brother   . Hypertension Brother   . Asthma Brother   . Diabetes Father   . Cancer Father   . Asthma Father   . Hypertension Father   . Thyroid disease Mother    Social History  Substance Use Topics  . Smoking status: Never Smoker   . Smokeless tobacco: Never Used  . Alcohol Use: No   OB History    Gravida Para Term Preterm AB TAB SAB Ectopic Multiple Living   6 2 2  0 4 2 2  0 0 2     Review of Systems  All other systems reviewed and are negative.     Allergies  Lisinopril  Home Medications   Prior to Admission medications   Medication Sig Start Date End Date Taking? Authorizing Provider  albuterol (PROVENTIL HFA;VENTOLIN HFA) 108 (90 BASE) MCG/ACT inhaler Inhale 1-2 puffs into the lungs every 4 (four) hours as needed for wheezing or shortness of breath. 12/03/15  Yes [place] N Rumley, DO  canagliflozin (INVOKANA) 100 MG TABS tablet Take 1 tablet (100 mg total) by mouth daily before breakfast. 03/23/16  Yes [place] N Rumley, DO  cephALEXin (KEFLEX) 500 MG capsule Take 1 capsule (500 mg total) by mouth 4 (four) times daily. 05/11/16  Yes Janne Napoleon, NP  fexofenadine [facility] ALLERGY) 180 MG  tablet Take 1 tablet (180 mg total) by mouth daily. Patient taking differently: Take 180 mg by mouth daily as needed for allergies.  02/03/16  Yes Virginia Crews, MD  fluticasone (FLONASE) 50 MCG/ACT nasal spray Place 2 sprays into both nostrils daily. Patient taking differently: Place 2 sprays into both nostrils daily as needed for allergies.  02/03/16  Yes Virginia Crews, MD  gabapentin (NEURONTIN) 300 MG capsule Take 1 capsule (300 mg total) by mouth at bedtime. 03/04/15  Yes [place] N Rumley, DO  losartan-hydrochlorothiazide (HYZAAR) 100-25 MG tablet TAKE 1 TABLET BY MOUTH DAILY. 11/06/15  Yes [place] N Rumley, DO  methimazole  (TAPAZOLE) 10 MG tablet Take 2 tablets (20 mg total) by mouth 2 (two) times daily. 04/22/16  Yes Renato Shin, MD  pantoprazole (PROTONIX) 20 MG tablet Take 1 tablet (20 mg total) by mouth daily. 05/03/16  Yes Melony Overly, MD  glucose blood test strip Use as instructed 12/04/15   Lorna Few, DO  Lancet Device MISC 1 Device by Does not apply route daily. 12/27/14   [place] N Rumley, DO  Lancets (ACCU-CHEK MULTICLIX) lancets Use as instructed 12/04/15   [place] N Rumley, DO  ondansetron (ZOFRAN ODT) 4 MG disintegrating tablet Take 1 tablet (4 mg total) by mouth every 8 (eight) hours as needed for nausea or vomiting. 05/19/16   Madgeline Rayo, PA-C   BP 134/64 mmHg  Pulse 94  Temp(Src) 98.9 F (37.2 C) (Oral)  Resp 18  Ht 5\' 4"  (1.626 m)  Wt 129.275 kg  BMI 48.90 kg/m2  SpO2 96%  LMP 05/02/2016 Physical Exam  Constitutional: She appears well-developed and well-nourished. No distress.  Nontoxic appearing  HENT:  Head: Normocephalic and atraumatic.  Mouth/Throat: Oropharynx is clear and moist.  Eyes: Conjunctivae are normal. Right eye exhibits no discharge. Left eye exhibits no discharge. No scleral icterus.  Neck: Normal range of motion. Neck supple.  Cardiovascular: Normal rate, regular rhythm and normal heart sounds.   Pulmonary/Chest: Effort normal and breath sounds normal. No respiratory distress. She has no wheezes. She has no rales.  Abdominal: Soft. Bowel sounds are normal. She exhibits no distension. There is no tenderness.  Musculoskeletal: Normal range of motion.  Neurological: She is alert. Coordination normal.  Skin: Skin is warm and dry.  Psychiatric: She has a normal mood and affect. Her behavior is normal.  Nursing note and vitals reviewed.   ED Course  Procedures (including critical care time) Labs Review Labs Reviewed  COMPREHENSIVE METABOLIC PANEL - Abnormal; Notable for the following:    Chloride 100 (*)    Glucose, Bld 372 (*)    All other components within  normal limits  CBC - Abnormal; Notable for the following:    RBC 5.39 (*)    MCV 70.5 (*)    MCH 23.6 (*)    All other components within normal limits  URINALYSIS, ROUTINE W REFLEX MICROSCOPIC (NOT AT [facility]) - Abnormal; Notable for the following:    Specific Gravity, Urine 1.043 (*)    Glucose, UA >1000 (*)    All other components within normal limits  URINE MICROSCOPIC-ADD ON - Abnormal; Notable for the following:    Squamous Epithelial / LPF 6-30 (*)    Bacteria, UA RARE (*)    All other components within normal limits  CBG MONITORING, ED - Abnormal; Notable for the following:    Glucose-Capillary 310 (*)    All other components within normal limits  CBG MONITORING, ED - Abnormal;  Notable for the following:    Glucose-Capillary 286 (*)    All other components within normal limits  LIPASE, BLOOD  PREGNANCY, URINE  POC URINE PREG, ED    Imaging Review No results found. I have personally reviewed and evaluated these images and lab results as part of my medical decision-making.   EKG Interpretation   Date/Time:  Monday May 18 2016 22:26:37 EDT Ventricular Rate:  106 PR Interval:  157 QRS Duration: 84 QT Interval:  327 QTC Calculation: 434 R Axis:   7 Text Interpretation:  Sinus tachycardia Probable anteroseptal infarct, old  tachycardia new from previous, otherwise no significant change Confirmed  by LITTLE MD, RACHEL [phone]) on 05/18/2016 10:31:26 PM      MDM   Final diagnoses:  Non-intractable vomiting with nausea, vomiting of unspecified type  Hyperglycemia   40 year old female with PMHx of Graves recently started on methimazole presenting with nausea and vomiting x 1 week. Afebrile and mildly tachycardic. Pt states she is tachycardic due to her Graves and this is not a new symptom. Nontoxic appearing. Moist mucus membranes. Heart RRR. Lungs CTAB. Abdomen is soft, non-tender without peritoneal signs. No leukocytosis. Glucose noted to be elevated to 372 which  decreased to 286 after IVF. No infection on UA. No anion gap. Pt given zofran and reglan with significant improvement in symptoms. Pt tolerating PO in ED and requesting discharge home. Pt has been in contact with her endocrinologist in regards to possible medication side effect and has appt scheduled. Encouraged pt to keep appointment or try to move it sooner and continue taking medication as prescribed. Will discharge with antiemetics.  At this time there does not appear to be any evidence of an acute emergency medical condition and the patient appears stable for discharge with appropriate outpatient follow up. Diagnosis was discussed with patient who verbalizes understanding and is agreeable to discharge. Pt case discussed with Dr. Rex Kras who agrees with my plan. Return precautions given in discharge paperwork and discussed with pt at bedside. Pt is stable for discharge.     Josephina Gip, PA-C 05/19/16 0326  Sharlett Iles, MD 05/20/16 [phone]

## 2016-05-18 NOTE — ED Notes (Signed)
Pt states 1 week of nausea and vomiting.

## 2016-05-19 LAB — CBG MONITORING, ED: GLUCOSE-CAPILLARY: 286 mg/dL — AB (ref 65–99)

## 2016-05-19 MED ORDER — SODIUM CHLORIDE 0.9 % IV BOLUS (SEPSIS)
1000.0000 mL | Freq: Once | INTRAVENOUS | Status: AC
  Administered 2016-05-19: 1000 mL via INTRAVENOUS

## 2016-05-19 MED ORDER — METOCLOPRAMIDE HCL 5 MG/ML IJ SOLN
10.0000 mg | Freq: Once | INTRAMUSCULAR | Status: AC
  Administered 2016-05-19: 10 mg via INTRAVENOUS
  Filled 2016-05-19: qty 2

## 2016-05-19 MED ORDER — DIPHENHYDRAMINE HCL 50 MG/ML IJ SOLN
25.0000 mg | Freq: Once | INTRAMUSCULAR | Status: AC
  Administered 2016-05-19: 25 mg via INTRAVENOUS
  Filled 2016-05-19: qty 1

## 2016-05-19 MED ORDER — ONDANSETRON 4 MG PO TBDP: 4.0000 mg | ORAL_TABLET | Freq: Three times a day (TID) | ORAL | Status: DC | PRN

## 2016-05-19 NOTE — ED Notes (Signed)
Pt was able to drink a 12 oz cup of water without vomiting.

## 2016-05-19 NOTE — Discharge Instructions (Signed)
Call your endocrinologist as soon as possible to schedule an appointment. Stay well hydrated. Continue your home medications and monitor your blood glucose since it was high today.    Nausea and Vomiting Nausea is a sick feeling that often comes before throwing up (vomiting). Vomiting is a reflex where stomach contents come out of your mouth. Vomiting can cause severe loss of body fluids (dehydration). Children and elderly adults can become dehydrated quickly, especially if they also have diarrhea. Nausea and vomiting are symptoms of a condition or disease. It is important to find the cause of your symptoms. CAUSES   Direct irritation of the stomach lining. This irritation can result from increased acid production (gastroesophageal reflux disease), infection, food poisoning, taking certain medicines (such as nonsteroidal anti-inflammatory drugs), alcohol use, or tobacco use.  Signals from the brain.These signals could be caused by a headache, heat exposure, an inner ear disturbance, increased pressure in the brain from injury, infection, a tumor, or a concussion, pain, emotional stimulus, or metabolic problems.  An obstruction in the gastrointestinal tract (bowel obstruction).  Illnesses such as diabetes, hepatitis, gallbladder problems, appendicitis, kidney problems, cancer, sepsis, atypical symptoms of a heart attack, or eating disorders.  Medical treatments such as chemotherapy and radiation.  Receiving medicine that makes you sleep (general anesthetic) during surgery. DIAGNOSIS Your caregiver may ask for tests to be done if the problems do not improve after a few days. Tests may also be done if symptoms are severe or if the reason for the nausea and vomiting is not clear. Tests may include:  Urine tests.  Blood tests.  Stool tests.  Cultures (to look for evidence of infection).  X-rays or other imaging studies. Test results can help your caregiver make decisions about treatment or  the need for additional tests. TREATMENT You need to stay well hydrated. Drink frequently but in small amounts.You may wish to drink water, sports drinks, clear broth, or eat frozen ice pops or gelatin dessert to help stay hydrated.When you eat, eating slowly may help prevent nausea.There are also some antinausea medicines that may help prevent nausea. HOME CARE INSTRUCTIONS   Take all medicine as directed by your caregiver.  If you do not have an appetite, do not force yourself to eat. However, you must continue to drink fluids.  If you have an appetite, eat a normal diet unless your caregiver tells you differently.  Eat a variety of complex carbohydrates (rice, wheat, potatoes, bread), lean meats, yogurt, fruits, and vegetables.  Avoid high-fat foods because they are more difficult to digest.  Drink enough water and fluids to keep your urine clear or pale yellow.  If you are dehydrated, ask your caregiver for specific rehydration instructions. Signs of dehydration may include:  Severe thirst.  Dry lips and mouth.  Dizziness.  Dark urine.  Decreasing urine frequency and amount.  Confusion.  Rapid breathing or pulse. SEEK IMMEDIATE MEDICAL CARE IF:   You have blood or brown flecks (like coffee grounds) in your vomit.  You have black or bloody stools.  You have a severe headache or stiff neck.  You are confused.  You have severe abdominal pain.  You have chest pain or trouble breathing.  You do not urinate at least once every 8 hours.  You develop cold or clammy skin.  You continue to vomit for longer than 24 to 48 hours.  You have a fever. MAKE SURE YOU:   Understand these instructions.  Will watch your condition.  Will get help  right away if you are not doing well or get worse.   This information is not intended to replace advice given to you by your health care provider. Make sure you discuss any questions you have with your health care provider.     Document Released: 12/07/2005 Document Revised: 02/29/2012 Document Reviewed: 05/06/2011 Elsevier Interactive Patient Education 2016 [place].  Hyperglycemia Hyperglycemia occurs when the glucose (sugar) in your blood is too high. Hyperglycemia can happen for many reasons, but it most often happens to people who do not know they have diabetes or are not managing their diabetes properly.  CAUSES  Whether you have diabetes or not, there are other causes of hyperglycemia. Hyperglycemia can occur when you have diabetes, but it can also occur in other situations that you might not be as aware of, such as: Diabetes  If you have diabetes and are having problems controlling your blood glucose, hyperglycemia could occur because of some of the following reasons:  Not following your meal plan.  Not taking your diabetes medications or not taking it properly.  Exercising less or doing less activity than you normally do.  Being sick. Pre-diabetes  This cannot be ignored. Before people develop Type 2 diabetes, they almost always have "pre-diabetes." This is when your blood glucose levels are higher than normal, but not yet high enough to be diagnosed as diabetes. Research has shown that some long-term damage to the body, especially the heart and circulatory system, may already be occurring during pre-diabetes. If you take action to manage your blood glucose when you have pre-diabetes, you may delay or prevent Type 2 diabetes from developing. Stress  If you have diabetes, you may be "diet" controlled or on oral medications or insulin to control your diabetes. However, you may find that your blood glucose is higher than usual in the hospital whether you have diabetes or not. This is often referred to as "stress hyperglycemia." Stress can elevate your blood glucose. This happens because of hormones put out by the body during times of stress. If stress has been the cause of your high blood glucose, it can  be followed regularly by your caregiver. That way he/she can make sure your hyperglycemia does not continue to get worse or progress to diabetes. Steroids  Steroids are medications that act on the infection fighting system (immune system) to block inflammation or infection. One side effect can be a rise in blood glucose. Most people can produce enough extra insulin to allow for this rise, but for those who cannot, steroids make blood glucose levels go even higher. It is not unusual for steroid treatments to "uncover" diabetes that is developing. It is not always possible to determine if the hyperglycemia will go away after the steroids are stopped. A special blood test called an A1c is sometimes done to determine if your blood glucose was elevated before the steroids were started. SYMPTOMS  Thirsty.  Frequent urination.  Dry mouth.  Blurred vision.  Tired or fatigue.  Weakness.  Sleepy.  Tingling in feet or leg. DIAGNOSIS  Diagnosis is made by monitoring blood glucose in one or all of the following ways:  A1c test. This is a chemical found in your blood.  Fingerstick blood glucose monitoring.  Laboratory results. TREATMENT  First, knowing the cause of the hyperglycemia is important before the hyperglycemia can be treated. Treatment may include, but is not be limited to:  Education.  Change or adjustment in medications.  Change or adjustment in meal plan.  Treatment for an illness, infection, etc.  More frequent blood glucose monitoring.  Change in exercise plan.  Decreasing or stopping steroids.  Lifestyle changes. HOME CARE INSTRUCTIONS   Test your blood glucose as directed.  Exercise regularly. Your caregiver will give you instructions about exercise. Pre-diabetes or diabetes which comes on with stress is helped by exercising.  Eat wholesome, balanced meals. Eat often and at regular, fixed times. Your caregiver or nutritionist will give you a meal plan to guide  your sugar intake.  Being at an ideal weight is important. If needed, losing as little as 10 to 15 pounds may help improve blood glucose levels. SEEK MEDICAL CARE IF:   You have questions about medicine, activity, or diet.  You continue to have symptoms (problems such as increased thirst, urination, or weight gain). SEEK IMMEDIATE MEDICAL CARE IF:   You are vomiting or have diarrhea.  Your breath smells fruity.  You are breathing faster or slower.  You are very sleepy or incoherent.  You have numbness, tingling, or pain in your feet or hands.  You have chest pain.  Your symptoms get worse even though you have been following your caregiver's orders.  If you have any other questions or concerns.   This information is not intended to replace advice given to you by your health care provider. Make sure you discuss any questions you have with your health care provider.   Document Released: 06/02/2001 Document Revised: 02/29/2012 Document Reviewed: 08/13/2015 Elsevier Interactive Patient Education [employer].

## 2016-05-25 ENCOUNTER — Encounter: Payer: Self-pay | Admitting: Endocrinology

## 2016-05-25 ENCOUNTER — Ambulatory Visit: Payer: 59 | Admitting: Endocrinology

## 2016-05-25 ENCOUNTER — Ambulatory Visit (INDEPENDENT_AMBULATORY_CARE_PROVIDER_SITE_OTHER): Payer: 59 | Admitting: Endocrinology

## 2016-05-25 VITALS — BP 156/88 | HR 123 | Temp 98.7°F | Ht 63.0 in | Wt 287.0 lb

## 2016-05-25 DIAGNOSIS — E0590 Thyrotoxicosis, unspecified without thyrotoxic crisis or storm: Secondary | ICD-10-CM

## 2016-05-25 LAB — T4, FREE: Free T4: 2.97 ng/dL — ABNORMAL HIGH (ref 0.60–1.60)

## 2016-05-25 LAB — TSH: TSH: 0.11 u[IU]/mL — ABNORMAL LOW (ref 0.35–4.50)

## 2016-05-25 MED ORDER — METOPROLOL SUCCINATE ER 50 MG PO TB24: 50.0000 mg | ORAL_TABLET | Freq: Every day | ORAL | Status: DC

## 2016-05-25 MED ORDER — METHIMAZOLE 10 MG PO TABS: 40.0000 mg | ORAL_TABLET | Freq: Two times a day (BID) | ORAL | Status: DC

## 2016-05-25 NOTE — Patient Instructions (Addendum)
blood tests are requested for you today.  We'll let you know about the results.   Based on the results, i hope we'll be ready to go ahead with the radioactive iodine.  It works like this:  First, you would stop the methimazole.   We would then check a thyroid "scan" (a special, but easy and painless type of thyroid x ray). you go to the x-ray department of the hospital to swallow a pill, which contains a miniscule amount of radiation.  You will not notice any symptoms from this.  You will go back to the x-ray department the next day, to lie down in front of a camera.  The results of this will be sent to me.   Based on the results, i hope to order for you a treatment pill of radioactive iodine.  Although it is a larger amount of radiation, you will again notice no symptoms from this.  The pill is gone from your body in a few days (during which you should stay away from other people), but takes several months to work.  Therefore, please return here approximately 6-8 weeks after the treatment.  This treatment has been available for many years, and the only known side-effect is an underactive thyroid.  It is possible that i would eventually prescribe for you a thyroid hormone pill, which is very inexpensive.  You don't have to worry about side-effects of this thyroid hormone pill, because it is the same molecule your thyroid makes.

## 2016-05-25 NOTE — Progress Notes (Signed)
Subjective:    Patient ID: Anna Hawkins, female    DOB: 1976/12/14, 40 y.o.   MRN: [id number]  HPI  Pt returns for f/u of hyperthyroidism (dx'ed 2017; she had been noted to have a goiter in 2002; she has never had thyroid imaging; she has had TL).  Pt reports few weeks of dysphagia in the neck, but no assoc pain.   Past Medical History  Diagnosis Date  . Asthma   . Chlamydia   . Obesity   . Fatty liver   . Pregnancy induced hypertension     diagnosed with pregnancy-started on aldomet-delivered 08/28/11  . Diabetes mellitus   . History of shoulder dystocia in prior pregnancy   . Graves disease     Past Surgical History  Procedure Laterality Date  . Therapeutic abortion    . Wisdom tooth extraction    . Laparoscopic tubal ligation  10/29/2011    Procedure: LAPAROSCOPIC TUBAL LIGATION;  Surgeon: Mora Bellman, MD;  Location: [facility];  Service: Gynecology;  Laterality: Bilateral;    Social History   Social History  . Marital Status: Single    Spouse Name: N/A  . Number of Children: N/A  . Years of Education: N/A   Occupational History  . Not on file.   Social History Main Topics  . Smoking status: Never Smoker   . Smokeless tobacco: Never Used  . Alcohol Use: No  . Drug Use: No  . Sexual Activity: Yes    Birth Control/ Protection: Surgical   Other Topics Concern  . Not on file   Social History Narrative    Current Outpatient Prescriptions on File Prior to Visit  Medication Sig Dispense Refill  . albuterol (PROVENTIL HFA;VENTOLIN HFA) 108 (90 BASE) MCG/ACT inhaler Inhale 1-2 puffs into the lungs every 4 (four) hours as needed for wheezing or shortness of breath. 18 g 4  . canagliflozin (INVOKANA) 100 MG TABS tablet Take 1 tablet (100 mg total) by mouth daily before breakfast. 90 tablet 3  . cephALEXin (KEFLEX) 500 MG capsule Take 1 capsule (500 mg total) by mouth 4 (four) times daily. 28 capsule 0  . fexofenadine (ALLEGRA ALLERGY) 180 MG tablet Take 1 tablet  (180 mg total) by mouth daily. (Patient taking differently: Take 180 mg by mouth daily as needed for allergies. ) 30 tablet 3  . fluticasone (FLONASE) 50 MCG/ACT nasal spray Place 2 sprays into both nostrils daily. (Patient taking differently: Place 2 sprays into both nostrils daily as needed for allergies. ) 16 g 6  . gabapentin (NEURONTIN) 300 MG capsule Take 1 capsule (300 mg total) by mouth at bedtime. 90 capsule 3  . glipiZIDE (GLUCOTROL XL) 5 MG 24 hr tablet TAKE 3 TABLETS (15 MG TOTAL) BY MOUTH DAILY WITH BREAKFAST. 90 tablet 3  . glucose blood test strip Use as instructed 100 each 12  . Lancet Device MISC 1 Device by Does not apply route daily. 90 each 3  . Lancets (ACCU-CHEK MULTICLIX) lancets Use as instructed 100 each 12  . losartan-hydrochlorothiazide (HYZAAR) 100-25 MG tablet TAKE 1 TABLET BY MOUTH DAILY. 30 tablet 7  . ondansetron (ZOFRAN ODT) 4 MG disintegrating tablet Take 1 tablet (4 mg total) by mouth every 8 (eight) hours as needed for nausea or vomiting. 20 tablet 0  . pantoprazole (PROTONIX) 20 MG tablet Take 1 tablet (20 mg total) by mouth daily. 14 tablet 0   No current facility-administered medications on file prior to visit.    Allergies  Allergen Reactions  . Lisinopril Rash and Cough    Family History  Problem Relation Age of Onset  . Diabetes Mother   . Hypertension Mother   . Asthma Mother   . Diabetes Brother   . Hypertension Brother   . Asthma Brother   . Diabetes Father   . Cancer Father   . Asthma Father   . Hypertension Father   . Thyroid disease Mother     BP 156/88 mmHg  Pulse 123  Temp(Src) 98.7 F (37.1 C) (Oral)  Ht 5\' 3"  (1.6 m)  Wt 287 lb (130.182 kg)  BMI 50.85 kg/m2  SpO2 97%  LMP 05/02/2016  Review of Systems Denies fever and sob.      Objective:   Physical Exam VITAL SIGNS:  See vs page GENERAL: no distress Thyroid is 3-4 times normal size (L>R).     Lab Results  Component Value Date   TSH 0.11* 05/25/2016   [place]:  Enlarged and diffusely heterogeneous thyroid gland. Numerous tiny (less than 1 cm) hypoechoic solid nodules bilaterally. Echogenic nodule versus pseudo nodule in the left mid gland measures up to 2.5 cm.  Ba swallow: Normal esophageal peristalsis.  No fixed esophageal narrowing or stricture.    Assessment & Plan:  Hyperthyroidism, persistent.  increase the methimazole to 40 mg, twice a day Tachycardia: she needs rx of this.  i rx'ed toprol. Dysphagia, w/u neg.  Because of this, pt wants RAI, but we need better control of hyperthyroidism first  Patient is advised the following: Patient Instructions  blood tests are requested for you today.  We'll let you know about the results.   Based on the results, i hope we'll be ready to go ahead with the radioactive iodine.  It works like this:  First, you would stop the methimazole.   We would then check a thyroid "scan" (a special, but easy and painless type of thyroid x ray). you go to the x-ray department of the hospital to swallow a pill, which contains a miniscule amount of radiation.  You will not notice any symptoms from this.  You will go back to the x-ray department the next day, to lie down in front of a camera.  The results of this will be sent to me.   Based on the results, i hope to order for you a treatment pill of radioactive iodine.  Although it is a larger amount of radiation, you will again notice no symptoms from this.  The pill is gone from your body in a few days (during which you should stay away from other people), but takes several months to work.  Therefore, please return here approximately 6-8 weeks after the treatment.  This treatment has been available for many years, and the only known side-effect is an underactive thyroid.  It is possible that i would eventually prescribe for you a thyroid hormone pill, which is very inexpensive.  You don't have to worry about side-effects of this thyroid hormone pill, because it is the same  molecule your thyroid makes.

## 2016-06-24 ENCOUNTER — Ambulatory Visit (INDEPENDENT_AMBULATORY_CARE_PROVIDER_SITE_OTHER): Payer: 59 | Admitting: Endocrinology

## 2016-06-24 ENCOUNTER — Encounter: Payer: Self-pay | Admitting: Endocrinology

## 2016-06-24 VITALS — BP 146/84 | HR 108 | Ht 63.0 in | Wt 291.0 lb

## 2016-06-24 DIAGNOSIS — E0590 Thyrotoxicosis, unspecified without thyrotoxic crisis or storm: Secondary | ICD-10-CM | POA: Diagnosis not present

## 2016-06-24 LAB — TSH: TSH: 0.03 u[IU]/mL — AB (ref 0.35–4.50)

## 2016-06-24 LAB — T4, FREE: FREE T4: 2.4 ng/dL — AB (ref 0.60–1.60)

## 2016-06-24 MED ORDER — METOPROLOL SUCCINATE ER 100 MG PO TB24: 100.0000 mg | ORAL_TABLET | Freq: Every day | ORAL | Status: DC

## 2016-06-24 NOTE — Patient Instructions (Addendum)
blood tests are requested for you today.  We'll let you know about the results.   Please increase the metoprolol to 100 mg daily.  I have sent a prescription to your pharmacy.   Based on the results, i hope we'll be ready to go ahead with the radioactive iodine.  It works like this:   First, you would stop the methimazole.   We would then check a thyroid "scan" (a special, but easy and painless type of thyroid x ray). you go to the x-ray department of the hospital to swallow a pill, which contains a miniscule amount of radiation.  You will not notice any symptoms from this.  You will go back to the x-ray department the next day, to lie down in front of a camera.  The results of this will be sent to me.   Based on the results, i hope to order for you a treatment pill of radioactive iodine.  Although it is a larger amount of radiation, you will again notice no symptoms from this.  The pill is gone from your body in a few days (during which you should stay away from other people), but takes several months to work.  please return here approximately 1-2 weeks after the treatment.  This treatment has been available for many years, and the only known side-effect is an underactive thyroid.  It is possible that i would eventually prescribe for you a thyroid hormone pill, which is very inexpensive.  You don't have to worry about side-effects of this thyroid hormone pill, because it is the same molecule your thyroid makes.

## 2016-06-24 NOTE — Progress Notes (Signed)
Subjective:    Patient ID: Anna Hawkins, female    DOB: 08-31-1976, 40 y.o.   MRN: [id number]  HPI Pt returns for f/u of hyperthyroidism (dx'ed 2017; she had been noted to have a goiter in 2002; she has never had thyroid imaging; she has had TL; plan is for RAI when it is well-controlled).  She still reports fullness sensation in the neck, and assoc solid dysphagia. She says she never misses meds.   Past Medical History  Diagnosis Date  . Asthma   . Chlamydia   . Obesity   . Fatty liver   . Pregnancy induced hypertension     diagnosed with pregnancy-started on aldomet-delivered 08/28/11  . Diabetes mellitus   . History of shoulder dystocia in prior pregnancy   . Graves disease     Past Surgical History  Procedure Laterality Date  . Therapeutic abortion    . Wisdom tooth extraction    . Laparoscopic tubal ligation  10/29/2011    Procedure: LAPAROSCOPIC TUBAL LIGATION;  Surgeon: Mora Bellman, MD;  Location: [facility];  Service: Gynecology;  Laterality: Bilateral;    Social History   Social History  . Marital Status: Single    Spouse Name: N/A  . Number of Children: N/A  . Years of Education: N/A   Occupational History  . Not on file.   Social History Main Topics  . Smoking status: Never Smoker   . Smokeless tobacco: Never Used  . Alcohol Use: No  . Drug Use: No  . Sexual Activity: Yes    Birth Control/ Protection: Surgical   Other Topics Concern  . Not on file   Social History Narrative    Current Outpatient Prescriptions on File Prior to Visit  Medication Sig Dispense Refill  . albuterol (PROVENTIL HFA;VENTOLIN HFA) 108 (90 BASE) MCG/ACT inhaler Inhale 1-2 puffs into the lungs every 4 (four) hours as needed for wheezing or shortness of breath. 18 g 4  . canagliflozin (INVOKANA) 100 MG TABS tablet Take 1 tablet (100 mg total) by mouth daily before breakfast. 90 tablet 3  . fexofenadine (ALLEGRA ALLERGY) 180 MG tablet Take 1 tablet (180 mg total) by mouth  daily. (Patient taking differently: Take 180 mg by mouth daily as needed for allergies. ) 30 tablet 3  . fluticasone (FLONASE) 50 MCG/ACT nasal spray Place 2 sprays into both nostrils daily. (Patient taking differently: Place 2 sprays into both nostrils daily as needed for allergies. ) 16 g 6  . gabapentin (NEURONTIN) 300 MG capsule Take 1 capsule (300 mg total) by mouth at bedtime. 90 capsule 3  . glucose blood test strip Use as instructed 100 each 12  . Lancet Device MISC 1 Device by Does not apply route daily. 90 each 3  . Lancets (ACCU-CHEK MULTICLIX) lancets Use as instructed 100 each 12  . losartan-hydrochlorothiazide (HYZAAR) 100-25 MG tablet TAKE 1 TABLET BY MOUTH DAILY. 30 tablet 7  . ondansetron (ZOFRAN ODT) 4 MG disintegrating tablet Take 1 tablet (4 mg total) by mouth every 8 (eight) hours as needed for nausea or vomiting. 20 tablet 0  . pantoprazole (PROTONIX) 20 MG tablet Take 1 tablet (20 mg total) by mouth daily. 14 tablet 0  . glipiZIDE (GLUCOTROL XL) 5 MG 24 hr tablet TAKE 3 TABLETS (15 MG TOTAL) BY MOUTH DAILY WITH BREAKFAST. (Patient not taking: Reported on 06/24/2016) 90 tablet 3   No current facility-administered medications on file prior to visit.    Allergies  Allergen Reactions  .  Lisinopril Rash and Cough    Family History  Problem Relation Age of Onset  . Diabetes Mother   . Hypertension Mother   . Asthma Mother   . Diabetes Brother   . Hypertension Brother   . Asthma Brother   . Diabetes Father   . Cancer Father   . Asthma Father   . Hypertension Father   . Thyroid disease Mother     BP 146/84 mmHg  Pulse 108  Ht 5\' 3"  (1.6 m)  Wt 291 lb (131.997 kg)  BMI 51.56 kg/m2  SpO2 97%   Review of Systems Denies fever.  She has fatigue.  Diarrhea has subsided.      Objective:   Physical Exam VITAL SIGNS:  See vs page GENERAL: no distress Thyroid is 3-4 times normal size (L>R).   Lab Results  Component Value Date   TSH 0.03* 06/24/2016        Assessment & Plan:  Hyperthyroidism: not improved with aggressive rx.  This is usually due to noncompliance.  D/c tapazole.  I ordered scan. HTN: she needs increased rx.   Dysphagia: unlikely thyroid related, but we'll see if it improves with RAI rx.    Patient is advised the following: Patient Instructions  blood tests are requested for you today.  We'll let you know about the results.   Please increase the metoprolol to 100 mg daily.  I have sent a prescription to your pharmacy.   Based on the results, i hope we'll be ready to go ahead with the radioactive iodine.  It works like this:   First, you would stop the methimazole.   We would then check a thyroid "scan" (a special, but easy and painless type of thyroid x ray). you go to the x-ray department of the hospital to swallow a pill, which contains a miniscule amount of radiation.  You will not notice any symptoms from this.  You will go back to the x-ray department the next day, to lie down in front of a camera.  The results of this will be sent to me.   Based on the results, i hope to order for you a treatment pill of radioactive iodine.  Although it is a larger amount of radiation, you will again notice no symptoms from this.  The pill is gone from your body in a few days (during which you should stay away from other people), but takes several months to work.  please return here approximately 1-2 weeks after the treatment.  This treatment has been available for many years, and the only known side-effect is an underactive thyroid.  It is possible that i would eventually prescribe for you a thyroid hormone pill, which is very inexpensive.  You don't have to worry about side-effects of this thyroid hormone pill, because it is the same molecule your thyroid makes.    Renato Shin, MD

## 2016-07-02 ENCOUNTER — Telehealth: Payer: Self-pay | Admitting: Endocrinology

## 2016-07-02 ENCOUNTER — Encounter (HOSPITAL_COMMUNITY)
Admission: RE | Admit: 2016-07-02 | Discharge: 2016-07-02 | Disposition: A | Discharge status: Home / Self Care | Payer: 59 | Source: Ambulatory Visit | Attending: Endocrinology | Admitting: Endocrinology

## 2016-07-02 DIAGNOSIS — E0590 Thyrotoxicosis, unspecified without thyrotoxic crisis or storm: Secondary | ICD-10-CM

## 2016-07-02 MED ORDER — SODIUM IODIDE I 131 CAPSULE
10.2000 | Freq: Once | INTRAVENOUS | Status: AC | PRN
  Administered 2016-07-02: 10.2 via ORAL

## 2016-07-03 ENCOUNTER — Encounter: Payer: Self-pay | Admitting: Endocrinology

## 2016-07-03 ENCOUNTER — Encounter (HOSPITAL_COMMUNITY)
Admission: RE | Admit: 2016-07-03 | Discharge: 2016-07-03 | Disposition: A | Discharge status: Home / Self Care | Payer: 59 | Source: Ambulatory Visit | Attending: Endocrinology | Admitting: Endocrinology

## 2016-07-03 ENCOUNTER — Other Ambulatory Visit: Payer: Self-pay | Admitting: Endocrinology

## 2016-07-03 DIAGNOSIS — E0590 Thyrotoxicosis, unspecified without thyrotoxic crisis or storm: Secondary | ICD-10-CM

## 2016-07-03 MED ORDER — SODIUM PERTECHNETATE TC 99M INJECTION
10.0000 | Freq: Once | INTRAVENOUS | Status: AC | PRN
  Administered 2016-07-03: 10 via INTRAVENOUS

## 2016-07-03 NOTE — Telephone Encounter (Signed)
error 

## 2016-07-06 ENCOUNTER — Telehealth: Payer: Self-pay | Admitting: Endocrinology

## 2016-07-06 ENCOUNTER — Ambulatory Visit: Payer: 59 | Admitting: Endocrinology

## 2016-07-06 DIAGNOSIS — E0590 Thyrotoxicosis, unspecified without thyrotoxic crisis or storm: Secondary | ICD-10-CM

## 2016-07-06 NOTE — Telephone Encounter (Signed)
Ok, i have ordered

## 2016-07-06 NOTE — Telephone Encounter (Signed)
[employer] Call: Caller states she would like to let Dr. Loanne Drilling know she is going to do the radioactive iodine therapy.

## 2016-07-09 DIAGNOSIS — Z0279 Encounter for issue of other medical certificate: Secondary | ICD-10-CM

## 2016-07-10 NOTE — Telephone Encounter (Signed)
Patient asking if her FLMA paper was sent to the [employer], Please advise

## 2016-07-17 ENCOUNTER — Ambulatory Visit (HOSPITAL_COMMUNITY): Payer: 59

## 2016-07-20 ENCOUNTER — Encounter (HOSPITAL_COMMUNITY)
Admission: RE | Admit: 2016-07-20 | Discharge: 2016-07-20 | Disposition: A | Discharge status: Home / Self Care | Payer: 59 | Source: Ambulatory Visit | Attending: Endocrinology | Admitting: Endocrinology

## 2016-07-20 DIAGNOSIS — E0590 Thyrotoxicosis, unspecified without thyrotoxic crisis or storm: Secondary | ICD-10-CM | POA: Diagnosis not present

## 2016-07-20 LAB — HCG, SERUM, QUALITATIVE: PREG SERUM: NEGATIVE

## 2016-07-20 MED ORDER — SODIUM IODIDE I 131 CAPSULE
15.0000 | Freq: Once | INTRAVENOUS | Status: AC | PRN
  Administered 2016-07-20: 14.3 via ORAL

## 2016-08-03 ENCOUNTER — Telehealth: Payer: Self-pay | Admitting: Endocrinology

## 2016-08-03 NOTE — Telephone Encounter (Signed)
error 

## 2016-08-04 ENCOUNTER — Telehealth: Payer: Self-pay | Admitting: Endocrinology

## 2016-08-04 NOTE — Telephone Encounter (Signed)
PT called checking on her paper work that Dr. Loanne Drilling was supposed to sign off on.

## 2016-08-04 NOTE — Telephone Encounter (Signed)
Your radioactive iodine pill was given on 07/20/16.  Are the few days following this what the form is for, or other days?

## 2016-08-04 NOTE — Telephone Encounter (Signed)
See note below and please advise, Thanks! 

## 2016-08-05 NOTE — Telephone Encounter (Signed)
I contacted the pt and she verified the form is for the days following the RAI.

## 2016-08-05 NOTE — Telephone Encounter (Signed)
done

## 2016-08-06 NOTE — Telephone Encounter (Signed)
I contacted the pt and advised we have submitted her paper work needed by her job today. Pt voiced understanding.

## 2016-08-10 ENCOUNTER — Telehealth: Payer: Self-pay | Admitting: Endocrinology

## 2016-08-10 NOTE — Telephone Encounter (Signed)
Patient is calling on the status of a form to be sent to her employer . Please advise.

## 2016-08-10 NOTE — Telephone Encounter (Signed)
I contacted the pt and advised we had faxed the forms to her employer on 08/05/2016. Pt voiced understanding and stated she would call her employer back to verify.

## 2016-08-12 ENCOUNTER — Encounter: Payer: Self-pay | Admitting: Endocrinology

## 2016-08-12 ENCOUNTER — Ambulatory Visit (INDEPENDENT_AMBULATORY_CARE_PROVIDER_SITE_OTHER): Payer: 59 | Admitting: Endocrinology

## 2016-08-12 VITALS — BP 128/86 | HR 95 | Ht 63.0 in | Wt 297.0 lb

## 2016-08-12 DIAGNOSIS — E0590 Thyrotoxicosis, unspecified without thyrotoxic crisis or storm: Secondary | ICD-10-CM | POA: Diagnosis not present

## 2016-08-12 LAB — TSH: TSH: 0.07 u[IU]/mL — ABNORMAL LOW (ref 0.35–4.50)

## 2016-08-12 LAB — T4, FREE: Free T4: 1.71 ng/dL — ABNORMAL HIGH (ref 0.60–1.60)

## 2016-08-12 NOTE — Patient Instructions (Signed)
blood tests are requested for you today.  We'll let you know about the results.  Please come back for a follow-up appointment in 1 month.  

## 2016-08-12 NOTE — Progress Notes (Signed)
Subjective:    Patient ID: Anna Hawkins, female    DOB: 11/20/1976, 40 y.o.   MRN: [id number]  HPI Pt returns for f/u of hyperthyroidism (dx'ed 2017; she had been noted to have a goiter in 2002; she has never had thyroid imaging; she has had TL; she had RAI in July, 2017).  She had RAI in July, 2017.  pt states she feels well in general, except for diffuse itching.   Past Medical History:  Diagnosis Date  . Asthma   . Chlamydia   . Diabetes mellitus   . Fatty liver   . Graves disease   . History of shoulder dystocia in prior pregnancy   . Obesity   . Pregnancy induced hypertension    diagnosed with pregnancy-started on aldomet-delivered 08/28/11    Past Surgical History:  Procedure Laterality Date  . LAPAROSCOPIC TUBAL LIGATION  10/29/2011   Procedure: LAPAROSCOPIC TUBAL LIGATION;  Surgeon: Mora Bellman, MD;  Location: [facility];  Service: Gynecology;  Laterality: Bilateral;  . THERAPEUTIC ABORTION    . WISDOM TOOTH EXTRACTION      Social History   Social History  . Marital status: Single    Spouse name: N/A  . Number of children: N/A  . Years of education: N/A   Occupational History  . Not on file.   Social History Main Topics  . Smoking status: Never Smoker  . Smokeless tobacco: Never Used  . Alcohol use No  . Drug use: No  . Sexual activity: Yes    Birth control/ protection: Surgical   Other Topics Concern  . Not on file   Social History Narrative  . No narrative on file    Current Outpatient Prescriptions on File Prior to Visit  Medication Sig Dispense Refill  . albuterol (PROVENTIL HFA;VENTOLIN HFA) 108 (90 BASE) MCG/ACT inhaler Inhale 1-2 puffs into the lungs every 4 (four) hours as needed for wheezing or shortness of breath. 18 g 4  . canagliflozin (INVOKANA) 100 MG TABS tablet Take 1 tablet (100 mg total) by mouth daily before breakfast. 90 tablet 3  . fexofenadine (ALLEGRA ALLERGY) 180 MG tablet Take 1 tablet (180 mg total) by mouth daily. (Patient  taking differently: Take 180 mg by mouth daily as needed for allergies. ) 30 tablet 3  . fluticasone (FLONASE) 50 MCG/ACT nasal spray Place 2 sprays into both nostrils daily. (Patient taking differently: Place 2 sprays into both nostrils daily as needed for allergies. ) 16 g 6  . glipiZIDE (GLUCOTROL XL) 5 MG 24 hr tablet TAKE 3 TABLETS (15 MG TOTAL) BY MOUTH DAILY WITH BREAKFAST. 90 tablet 3  . glucose blood test strip Use as instructed 100 each 12  . Lancet Device MISC 1 Device by Does not apply route daily. 90 each 3  . Lancets (ACCU-CHEK MULTICLIX) lancets Use as instructed 100 each 12  . losartan-hydrochlorothiazide (HYZAAR) 100-25 MG tablet TAKE 1 TABLET BY MOUTH DAILY. 30 tablet 7  . metoprolol succinate (TOPROL-XL) 100 MG 24 hr tablet Take 1 tablet (100 mg total) by mouth daily. Take with or immediately following a meal. 90 tablet 3  . gabapentin (NEURONTIN) 300 MG capsule Take 1 capsule (300 mg total) by mouth at bedtime. (Patient not taking: Reported on 08/12/2016) 90 capsule 3  . ondansetron (ZOFRAN ODT) 4 MG disintegrating tablet Take 1 tablet (4 mg total) by mouth every 8 (eight) hours as needed for nausea or vomiting. (Patient not taking: Reported on 08/12/2016) 20 tablet 0  .  pantoprazole (PROTONIX) 20 MG tablet Take 1 tablet (20 mg total) by mouth daily. (Patient not taking: Reported on 08/12/2016) 14 tablet 0   No current facility-administered medications on file prior to visit.     Allergies  Allergen Reactions  . Lisinopril Rash and Cough    Family History  Problem Relation Age of Onset  . Diabetes Mother   . Hypertension Mother   . Asthma Mother   . Diabetes Brother   . Hypertension Brother   . Asthma Brother   . Diabetes Father   . Cancer Father   . Asthma Father   . Hypertension Father   . Thyroid disease Mother     BP 128/86   Pulse 95   Ht 5\' 3"  (1.6 m)   Wt 297 lb (134.7 kg)   SpO2 97%   BMI 52.61 kg/m   Review of Systems She has gained weight      Objective:   Physical Exam VITAL SIGNS:  See vs page GENERAL: no distress Thyroid is 3-4 times normal size (L>R).  Skin: not diaphoretic Neuro: slight tremor of the hands.   Lab Results  Component Value Date   TSH 0.07 (L) 08/12/2016      Assessment & Plan:  Hyperthyroidism: not better yet, after RAI: we discussed the option of taking tapazole while RAI is working.  She declines.

## 2016-08-13 ENCOUNTER — Telehealth: Payer: Self-pay | Admitting: Endocrinology

## 2016-08-13 NOTE — Telephone Encounter (Signed)
please call patient: Instructions should also say you should avoid activity until 09/02/16.  Minimize walking and lifting.

## 2016-08-13 NOTE — Telephone Encounter (Signed)
I contacted the pt and advised of doctors instructions. Pt verbalized understanding and stated her employer has received the FMLA on 08/13/2016.

## 2016-08-27 ENCOUNTER — Encounter: Payer: Self-pay | Admitting: Endocrinology

## 2016-08-28 ENCOUNTER — Encounter: Payer: Self-pay | Admitting: Endocrinology

## 2016-08-31 ENCOUNTER — Encounter: Payer: Self-pay | Admitting: Endocrinology

## 2016-09-01 ENCOUNTER — Encounter: Payer: Self-pay | Admitting: Endocrinology

## 2016-09-10 NOTE — Telephone Encounter (Signed)
Patient ask you to give her a call °

## 2016-09-11 ENCOUNTER — Encounter: Payer: Self-pay | Admitting: Endocrinology

## 2016-09-11 ENCOUNTER — Ambulatory Visit (INDEPENDENT_AMBULATORY_CARE_PROVIDER_SITE_OTHER): Payer: 59 | Admitting: Endocrinology

## 2016-09-11 VITALS — BP 128/78 | HR 100 | Ht 63.0 in | Wt 292.0 lb

## 2016-09-11 DIAGNOSIS — E0590 Thyrotoxicosis, unspecified without thyrotoxic crisis or storm: Secondary | ICD-10-CM | POA: Diagnosis not present

## 2016-09-11 LAB — T4, FREE: FREE T4: 2.75 ng/dL — AB (ref 0.60–1.60)

## 2016-09-11 LAB — TSH: TSH: 0.12 u[IU]/mL — AB (ref 0.35–4.50)

## 2016-09-11 NOTE — Progress Notes (Signed)
Subjective:    Patient ID: Anna Hawkins, female    DOB: September 11, 1976, 40 y.o.   MRN: [id number]  HPI Pt returns for f/u of hyperthyroidism (dx'ed 2017; she had been noted to have a goiter in 2002; she has never had thyroid imaging; she has had TL; she had RAI in July, 2017).  pt states she feels well in general, except for slight tremor.  Past Medical History:  Diagnosis Date  . Asthma   . Chlamydia   . Diabetes mellitus   . Fatty liver   . Graves disease   . History of shoulder dystocia in prior pregnancy   . Obesity   . Pregnancy induced hypertension    diagnosed with pregnancy-started on aldomet-delivered 08/28/11    Past Surgical History:  Procedure Laterality Date  . LAPAROSCOPIC TUBAL LIGATION  10/29/2011   Procedure: LAPAROSCOPIC TUBAL LIGATION;  Surgeon: Mora Bellman, MD;  Location: [facility];  Service: Gynecology;  Laterality: Bilateral;  . THERAPEUTIC ABORTION    . WISDOM TOOTH EXTRACTION      Social History   Social History  . Marital status: Single    Spouse name: N/A  . Number of children: N/A  . Years of education: N/A   Occupational History  . Not on file.   Social History Main Topics  . Smoking status: Never Smoker  . Smokeless tobacco: Never Used  . Alcohol use No  . Drug use: No  . Sexual activity: Yes    Birth control/ protection: Surgical   Other Topics Concern  . Not on file   Social History Narrative  . No narrative on file    Current Outpatient Prescriptions on File Prior to Visit  Medication Sig Dispense Refill  . albuterol (PROVENTIL HFA;VENTOLIN HFA) 108 (90 BASE) MCG/ACT inhaler Inhale 1-2 puffs into the lungs every 4 (four) hours as needed for wheezing or shortness of breath. 18 g 4  . canagliflozin (INVOKANA) 100 MG TABS tablet Take 1 tablet (100 mg total) by mouth daily before breakfast. 90 tablet 3  . fexofenadine (ALLEGRA ALLERGY) 180 MG tablet Take 1 tablet (180 mg total) by mouth daily. (Patient taking differently: Take 180  mg by mouth daily as needed for allergies. ) 30 tablet 3  . fluticasone (FLONASE) 50 MCG/ACT nasal spray Place 2 sprays into both nostrils daily. (Patient taking differently: Place 2 sprays into both nostrils daily as needed for allergies. ) 16 g 6  . glipiZIDE (GLUCOTROL XL) 5 MG 24 hr tablet TAKE 3 TABLETS (15 MG TOTAL) BY MOUTH DAILY WITH BREAKFAST. 90 tablet 3  . glucose blood test strip Use as instructed 100 each 12  . Lancet Device MISC 1 Device by Does not apply route daily. 90 each 3  . Lancets (ACCU-CHEK MULTICLIX) lancets Use as instructed 100 each 12  . losartan-hydrochlorothiazide (HYZAAR) 100-25 MG tablet TAKE 1 TABLET BY MOUTH DAILY. 30 tablet 7  . metoprolol succinate (TOPROL-XL) 100 MG 24 hr tablet Take 1 tablet (100 mg total) by mouth daily. Take with or immediately following a meal. 90 tablet 3   No current facility-administered medications on file prior to visit.     Allergies  Allergen Reactions  . Lisinopril Rash and Cough    Family History  Problem Relation Age of Onset  . Diabetes Mother   . Hypertension Mother   . Asthma Mother   . Diabetes Brother   . Hypertension Brother   . Asthma Brother   . Diabetes Father   .  Cancer Father   . Asthma Father   . Hypertension Father   . Thyroid disease Mother    BP 128/78   Pulse 100   Ht 5\' 3"  (1.6 m)   Wt 292 lb (132.5 kg)   SpO2 99%   BMI 51.73 kg/m   Review of Systems Denies fever    Objective:   Physical Exam VITAL SIGNS:  See vs page GENERAL: no distress Thyroid is 3-4 times normal size (L>R).  Skin: not diaphoretic Neuro: no tremor  Lab Results  Component Value Date   TSH 0.12 (L) 09/11/2016      Assessment & Plan:  Hyperthyroidism: not better yet.

## 2016-09-11 NOTE — Patient Instructions (Addendum)
blood tests are requested for you today.  We'll let you know about the results.  Please come back for a follow-up appointment in 1 month.  

## 2016-09-18 NOTE — Telephone Encounter (Signed)
Patient is calling on the status of her FLMA papers. Please advise

## 2016-09-28 ENCOUNTER — Emergency Department (HOSPITAL_COMMUNITY)
Admission: EM | Admit: 2016-09-28 | Discharge: 2016-09-29 | Disposition: A | Discharge status: Home / Self Care | Payer: 59 | Attending: Emergency Medicine | Admitting: Emergency Medicine

## 2016-09-28 ENCOUNTER — Encounter (HOSPITAL_COMMUNITY): Payer: Self-pay

## 2016-09-28 DIAGNOSIS — T887XXA Unspecified adverse effect of drug or medicament, initial encounter: Secondary | ICD-10-CM | POA: Insufficient documentation

## 2016-09-28 DIAGNOSIS — Z79899 Other long term (current) drug therapy: Secondary | ICD-10-CM | POA: Diagnosis not present

## 2016-09-28 DIAGNOSIS — Y829 Unspecified medical devices associated with adverse incidents: Secondary | ICD-10-CM | POA: Diagnosis not present

## 2016-09-28 DIAGNOSIS — R197 Diarrhea, unspecified: Secondary | ICD-10-CM | POA: Insufficient documentation

## 2016-09-28 DIAGNOSIS — T50905A Adverse effect of unspecified drugs, medicaments and biological substances, initial encounter: Secondary | ICD-10-CM

## 2016-09-28 DIAGNOSIS — T383X5A Adverse effect of insulin and oral hypoglycemic [antidiabetic] drugs, initial encounter: Secondary | ICD-10-CM | POA: Insufficient documentation

## 2016-09-28 DIAGNOSIS — J45909 Unspecified asthma, uncomplicated: Secondary | ICD-10-CM | POA: Diagnosis not present

## 2016-09-28 DIAGNOSIS — I10 Essential (primary) hypertension: Secondary | ICD-10-CM | POA: Insufficient documentation

## 2016-09-28 DIAGNOSIS — E11649 Type 2 diabetes mellitus with hypoglycemia without coma: Secondary | ICD-10-CM | POA: Diagnosis present

## 2016-09-28 DIAGNOSIS — E162 Hypoglycemia, unspecified: Secondary | ICD-10-CM

## 2016-09-28 LAB — URINALYSIS, ROUTINE W REFLEX MICROSCOPIC
BILIRUBIN URINE: NEGATIVE
Glucose, UA: NEGATIVE mg/dL
Hgb urine dipstick: NEGATIVE
Ketones, ur: NEGATIVE mg/dL
Leukocytes, UA: NEGATIVE
NITRITE: NEGATIVE
PH: 5.5 (ref 5.0–8.0)
Protein, ur: NEGATIVE mg/dL
SPECIFIC GRAVITY, URINE: 1.019 (ref 1.005–1.030)

## 2016-09-28 LAB — BASIC METABOLIC PANEL
Anion gap: 9 (ref 5–15)
BUN: 11 mg/dL (ref 6–20)
CHLORIDE: 109 mmol/L (ref 101–111)
CO2: 22 mmol/L (ref 22–32)
CREATININE: 0.74 mg/dL (ref 0.44–1.00)
Calcium: 9.5 mg/dL (ref 8.9–10.3)
GFR calc Af Amer: >60 mL/min (ref >60)
GFR calc non Af Amer: >60 mL/min (ref >60)
Glucose, Bld: 61 mg/dL — ABNORMAL LOW (ref 65–99)
POTASSIUM: 3.3 mmol/L — AB (ref 3.5–5.1)
SODIUM: 140 mmol/L (ref 135–145)

## 2016-09-28 LAB — CBC
HEMATOCRIT: 33.5 % — AB (ref 36.0–46.0)
HEMOGLOBIN: 10.8 g/dL — AB (ref 12.0–15.0)
MCH: 23.1 pg — ABNORMAL LOW (ref 26.0–34.0)
MCHC: 32.2 g/dL (ref 30.0–36.0)
MCV: 71.6 fL — ABNORMAL LOW (ref 78.0–100.0)
Platelets: 276 10*3/uL (ref 150–400)
RBC: 4.68 MIL/uL (ref 3.87–5.11)
RDW: 13.7 % (ref 11.5–15.5)
WBC: 9.3 10*3/uL (ref 4.0–10.5)

## 2016-09-28 LAB — CBG MONITORING, ED
GLUCOSE-CAPILLARY: 63 mg/dL — AB (ref 65–99)
GLUCOSE-CAPILLARY: 73 mg/dL (ref 65–99)

## 2016-09-28 NOTE — ED Provider Notes (Signed)
[facility] DEPT Provider Note   CSN: [id number] Arrival date & time: 09/28/16  2145   By signing my name below, I, Anna Hawkins, attest that this documentation has been prepared under the direction and in the presence of Varney Biles, MD  Electronically Signed: Delton Hawkins, ED Scribe. 09/28/16. 12:09 AM.   History   Chief Complaint Chief Complaint  Patient presents with  . Hypoglycemia   The history is provided by the patient. No language interpreter was used.   HPI Comments:  Anna Hawkins is a 40 y.o. female, with a hx of hyperthyroidism and PSHx of a thyroid ablation, who presents to the Emergency Department complaining of hypoglycemia noticed today. She states her CBC has been in a range of 60-80. Pt notes she does not regularly check her sugar regularly. She notes associated diaphoresis, nausea, SOB and had 10 bowel movements today. She denies a change in diet, change in appetite, fevers, cough, rash, chest pain and dysuria. Pt also denies being on any new medications and any recent travels. Her PCP is Dr. Gerlean Ren.    Past Medical History:  Diagnosis Date  . Asthma   . Chlamydia   . Diabetes mellitus   . Fatty liver   . Graves disease   . History of shoulder dystocia in prior pregnancy   . Obesity   . Pregnancy induced hypertension    diagnosed with pregnancy-started on aldomet-delivered 08/28/11    Patient Active Problem List   Diagnosis Date Noted  . Hyperthyroidism 03/25/2016  . Physical exam, annual 11/03/2014  . Essential hypertension 10/29/2014  . Diabetes ([place]) 10/29/2014  . Knee pain 10/29/2014  . Diarrhea 10/29/2014  . Obesity 10/19/2011    Past Surgical History:  Procedure Laterality Date  . LAPAROSCOPIC TUBAL LIGATION  10/29/2011   Procedure: LAPAROSCOPIC TUBAL LIGATION;  Surgeon: Mora Bellman, MD;  Location: [facility];  Service: Gynecology;  Laterality: Bilateral;  . THERAPEUTIC ABORTION    . WISDOM TOOTH EXTRACTION      OB History    Gravida Para Term Preterm AB Living   6 2 2  0 4 2   SAB TAB Ectopic Multiple Live Births   2 2 0 0         Home Medications    Prior to Admission medications   Medication Sig Start Date End Date Taking? Authorizing Provider  albuterol (PROVENTIL HFA;VENTOLIN HFA) 108 (90 BASE) MCG/ACT inhaler Inhale 1-2 puffs into the lungs every 4 (four) hours as needed for wheezing or shortness of breath. 12/03/15  Yes [place] N Rumley, DO  fexofenadine (ALLEGRA ALLERGY) 180 MG tablet Take 1 tablet (180 mg total) by mouth daily. Patient taking differently: Take 180 mg by mouth daily as needed for allergies.  02/03/16  Yes Virginia Crews, MD  fluticasone (FLONASE) 50 MCG/ACT nasal spray Place 2 sprays into both nostrils daily. Patient taking differently: Place 2 sprays into both nostrils daily as needed for allergies.  02/03/16  Yes Virginia Crews, MD  glipiZIDE (GLUCOTROL XL) 5 MG 24 hr tablet TAKE 3 TABLETS (15 MG TOTAL) BY MOUTH DAILY WITH BREAKFAST. 05/19/16  Yes [place] N Rumley, DO  losartan-hydrochlorothiazide (HYZAAR) 100-25 MG tablet TAKE 1 TABLET BY MOUTH DAILY. 11/06/15  Yes [place] N Rumley, DO  metoprolol succinate (TOPROL-XL) 100 MG 24 hr tablet Take 1 tablet (100 mg total) by mouth daily. Take with or immediately following a meal. 06/24/16  Yes Renato Shin, MD  canagliflozin (INVOKANA) 100 MG TABS tablet Take 1 tablet (100  mg total) by mouth daily before breakfast. Patient not taking: Reported on 09/28/2016 03/23/16   Burna Cash Rumley, DO  glucose blood test strip Use as instructed 12/04/15   Lorna Few, DO  Lancet Device MISC 1 Device by Does not apply route daily. 12/27/14   [place] N Rumley, DO  Lancets (ACCU-CHEK MULTICLIX) lancets Use as instructed 12/04/15   Lorna Few, DO    Family History Family History  Problem Relation Age of Onset  . Diabetes Mother   . Hypertension Mother   . Asthma Mother   . Thyroid disease Mother   . Diabetes Brother   . Hypertension  Brother   . Asthma Brother   . Diabetes Father   . Cancer Father   . Asthma Father   . Hypertension Father     Social History Social History  Substance Use Topics  . Smoking status: Never Smoker  . Smokeless tobacco: Never Used  . Alcohol use No     Allergies   Lisinopril   Review of Systems Review of Systems  Constitutional: Positive for diaphoresis. Negative for appetite change and fever.  Respiratory: Negative for cough.   Cardiovascular: Negative for chest pain.  Gastrointestinal: Positive for diarrhea and nausea.  Genitourinary: Negative for dysuria.  Skin: Negative for rash.   10 Systems reviewed and are negative for acute change except as noted in the HPI.  Physical Exam Updated Vital Signs BP 149/97   Pulse 90   Temp 98.4 F (36.9 C) (Oral)   Resp 23   Ht 5\' 4"  (1.626 m)   Wt 294 lb (133.4 kg)   SpO2 100%   BMI 50.46 kg/m   Physical Exam  Constitutional: She is oriented to person, place, and time. She appears well-developed and well-nourished.  HENT:  Head: Normocephalic.  Eyes: EOM are normal.  pupils 2 mm and equal.  Neck: Normal range of motion.  Thyroid gland appears nodular  Cardiovascular: Regular rhythm.   Mild tachycardia    Pulmonary/Chest: Effort normal.  Abdominal: She exhibits no distension.  Musculoskeletal: Normal range of motion.  Neurological: She is alert and oriented to person, place, and time.  No nystagmus   Psychiatric: She has a normal mood and affect.  Nursing note and vitals reviewed.    ED Treatments / Results  DIAGNOSTIC STUDIES:  Oxygen Saturation is 100% on RA, normal by my interpretation.    COORDINATION OF CARE:  11:40 PM Discussed treatment plan with pt at bedside and pt agreed to plan.  Labs (all labs ordered are listed, but only abnormal results are displayed) Labs Reviewed  BASIC METABOLIC PANEL - Abnormal; Notable for the following:       Result Value   Potassium 3.3 (*)    Glucose, Bld 61 (*)     All other components within normal limits  CBC - Abnormal; Notable for the following:    Hemoglobin 10.8 (*)    HCT 33.5 (*)    MCV 71.6 (*)    MCH 23.1 (*)    All other components within normal limits  URINALYSIS, ROUTINE W REFLEX MICROSCOPIC (NOT AT [facility]) - Abnormal; Notable for the following:    APPearance CLOUDY (*)    All other components within normal limits  TSH - Abnormal; Notable for the following:    TSH <0.010 (*)    All other components within normal limits  CBG MONITORING, ED - Abnormal; Notable for the following:    Glucose-Capillary 63 (*)  All other components within normal limits  CBG MONITORING, ED - Abnormal; Notable for the following:    Glucose-Capillary 107 (*)    All other components within normal limits  CBG MONITORING, ED  CBG MONITORING, ED  CBG MONITORING, ED    EKG  EKG Interpretation None       Radiology No results found.  Procedures Procedures (including critical care time)  Medications Ordered in ED Medications - No data to display   Initial Impression / Assessment and Plan / ED Course  I have reviewed the triage vital signs and the nursing notes.  Pertinent labs & imaging results that were available during my care of the patient were reviewed by me and considered in my medical decision making (see chart for details).  Clinical Course    I personally performed the services described in this documentation, which was scribed in my presence. The recorded information has been reviewed and is accurate.   Pt comes in with cc of low blood sugar. Her blood sugars are in the 60s and 70s range. 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 Pt has stopped her invokana on Thursday. She is on glipizide still. Also reports having diarrhea - which is contributing. No liver dz or AKI and this is not a case of overdose.   ROS otherwise doesn't reveal any concerns for ACS, sepsis. We will get serial CBG and watch patient in the ER. Oral food started here. If  pt stays stable -we will d/c her and advise her to stop her glipizide as well and see her pcp promptly for optimization.  Final Clinical Impressions(s) / ED Diagnoses   Final diagnoses:  Hypoglycemia  Diarrhea, unspecified type    New Prescriptions New Prescriptions   No medications on file      Varney Biles, MD 09/29/16 [phone]

## 2016-09-28 NOTE — Telephone Encounter (Signed)
Patient is calling on the status of FLMA papers. Please advise

## 2016-09-28 NOTE — ED Triage Notes (Signed)
Pt complaining of hypoglycemia. Pt states CBG normally runs in the ~200's range. Pt states CBG today has been ~80/90's range. CBG = 73 at triage. Pt complaining of diaphoresis and nausea. Pt states recent dx of graves disease.

## 2016-09-29 LAB — CBG MONITORING, ED
GLUCOSE-CAPILLARY: 107 mg/dL — AB (ref 65–99)
Glucose-Capillary: 73 mg/dL (ref 65–99)
Glucose-Capillary: 79 mg/dL (ref 65–99)
Glucose-Capillary: 122 mg/dL — ABNORMAL HIGH (ref 65–99)

## 2016-09-29 LAB — TSH: TSH: <0.01 u[IU]/mL — ABNORMAL LOW (ref 0.350–4.500)

## 2016-09-29 NOTE — Discharge Instructions (Signed)
Please stop the diabetes meds for now. Continue to check your sugar at least 2 times a day. SEE YOUR PRIMARY CARE DOCTOR IN 3 DAYS.  Return to the ER if the blood sugars are getting out of control or dropping again.  ALSO - MAKE SURE YOU KEEP SEEING THE THYROID DOCTOR.

## 2016-10-01 NOTE — Progress Notes (Deleted)
Subjective:     Patient ID: Anna Hawkins, female   DOB: 03-16-1976, 40 y.o.   MRN: [id number]  HPI Anna Hawkins is a 40yo female presenting today for ED follow up. - Presented to ED on 09/28/16 with hypoglycemia. Reported CBG of 60-80 with diarrhea, diaphoresis, nausea, shortness of breath. Reported stopping Invokana on 10/5. Advised to discontinue both Glipizide and Invokana. - Currently prescribed Glipizide 15mg  daily, Invokana 100mg  daily - Last A1C 8.8 in 03/23/16.  - Last office visit 03/23/16. Had been meeting with Bariatrics and working with them to lose weight and undergo Gastric Sleeve. Instructed to follow up in 3 months. Hyperthyroidism noted. Instructed to follow up with Endocrinology. - Check lipid panel, A1C - Last pap smear 10/2014 with negative cytology and negative for HPV, next due in 2020  Review of Systems     Objective:   Physical Exam     Assessment:     ***    Plan:     ***

## 2016-10-02 ENCOUNTER — Ambulatory Visit: Payer: 59 | Admitting: Family Medicine

## 2016-10-11 NOTE — Progress Notes (Signed)
Subjective:     Patient ID: Anna Hawkins, female   DOB: April 01, 1976, 40 y.o.   MRN: [id number]  HPI Mrs. Anna Hawkins is a 40yo female presenting today for diabetes follow up. - History of hyperthyroidism also noted. Followed by Endocrinology. RAI in June 2017. - Last seen in 03/2016. Was following with Bariatrics and working with them to lose weight to obtain gastric sleeve surgery. A1C 8.8. Glipizide 15mg  continued and Invokana initiated. - ED visit noted on 10/9 for hypoglycemia. Had stopped Invokana on 10/5. ED Physician stopped Glipizide until follow up in clinic. - Has stopped following with Bariatrics since last office visit. Must have stabilized thyroid before they can proceed with surgery and she is reconsidering surgery at this time. - Has been on insulin in the past. Considering this versus weekly injections of Trulicity. - Metformin not tolerated in past. Reports it has caused hospitalizations from dehydration every time she has tried it. - Reports she did not like how Invokana made her feel. - Checks her blood sugars in the mornings. Does not write it down. This morning it was 174--unable to remember other readings. - Last Lipid Panel 10/2014 - Allergy to Lisinopril noted. Currently prescribed Losartan-HCTZ.  Review of Systems Per HPI.    Objective:   Physical Exam  Constitutional: She appears well-developed and well-nourished. No distress.  HENT:  Head: Normocephalic and atraumatic.  Cardiovascular: Normal rate and regular rhythm.   No murmur heard. Pedal pulses palpable  Pulmonary/Chest: Effort normal. No respiratory distress. She has no wheezes.  Musculoskeletal: She exhibits no edema.  Neurological:  Sensation intact over feet bilaterally.  Psychiatric: She has a normal mood and affect. Her behavior is normal.      Assessment and Plan:     Diabetes - Initiated on Trulicity. Has failed treatment with Metformin, Glipizide, Invokana. Previously on Insulin and amenable  to restarting in future if needed. - To check blood sugar once daily. Bring blood sugar log to next office visit. - Note previously followed by Bariatric Surgery, but does not wish to pursue surgery at this time. Continue to work on diet and exercise. - Continue Losartan-HCTZ. History of proteinuria. - Follow up in 3 months. Repeat A1C at that time.

## 2016-10-12 ENCOUNTER — Encounter: Payer: Self-pay | Admitting: Family Medicine

## 2016-10-12 ENCOUNTER — Ambulatory Visit (INDEPENDENT_AMBULATORY_CARE_PROVIDER_SITE_OTHER): Payer: 59 | Admitting: Endocrinology

## 2016-10-12 ENCOUNTER — Encounter: Payer: Self-pay | Admitting: Endocrinology

## 2016-10-12 ENCOUNTER — Ambulatory Visit (INDEPENDENT_AMBULATORY_CARE_PROVIDER_SITE_OTHER): Payer: Self-pay | Admitting: Family Medicine

## 2016-10-12 VITALS — BP 128/80 | HR 79 | Ht 70.0 in | Wt 295.0 lb

## 2016-10-12 VITALS — BP 143/79 | HR 79 | Temp 97.6°F | Ht 70.0 in | Wt 295.0 lb

## 2016-10-12 DIAGNOSIS — E0590 Thyrotoxicosis, unspecified without thyrotoxic crisis or storm: Secondary | ICD-10-CM

## 2016-10-12 DIAGNOSIS — Z23 Encounter for immunization: Secondary | ICD-10-CM

## 2016-10-12 DIAGNOSIS — E119 Type 2 diabetes mellitus without complications: Secondary | ICD-10-CM

## 2016-10-12 LAB — POCT GLYCOSYLATED HEMOGLOBIN (HGB A1C): Hemoglobin A1C: 8.1

## 2016-10-12 LAB — LIPID PANEL
CHOL/HDL RATIO: 2.1 ratio (ref <=5.0)
CHOLESTEROL: 135 mg/dL (ref 125–200)
HDL: 64 mg/dL (ref >=46)
LDL Cholesterol: 50 mg/dL (ref <130)
TRIGLYCERIDES: 106 mg/dL (ref <150)
VLDL: 21 mg/dL (ref <30)

## 2016-10-12 LAB — T4, FREE: FREE T4: 2.03 ng/dL — AB (ref 0.60–1.60)

## 2016-10-12 LAB — GLUCOSE, CAPILLARY: Glucose-Capillary: 196 mg/dL — ABNORMAL HIGH (ref 65–99)

## 2016-10-12 LAB — TSH: TSH: 0.03 u[IU]/mL — ABNORMAL LOW (ref 0.35–4.50)

## 2016-10-12 MED ORDER — DULAGLUTIDE 0.75 MG/0.5ML ~~LOC~~ SOAJ: 0.7500 mg | SUBCUTANEOUS | 4 refills | Status: DC

## 2016-10-12 MED ORDER — BLOOD GLUCOSE MONITOR KIT: PACK | 0 refills | Status: AC

## 2016-10-12 NOTE — Assessment & Plan Note (Signed)
-   Initiated on Trulicity. Has failed treatment with Metformin, Glipizide, Invokana. Previously on Insulin and amenable to restarting in future if needed. - To check blood sugar once daily. Bring blood sugar log to next office visit. - Note previously followed by Bariatric Surgery, but does not wish to pursue surgery at this time. Continue to work on diet and exercise. - Continue Losartan-HCTZ. History of proteinuria. - Follow up in 3 months. Repeat A1C at that time.

## 2016-10-12 NOTE — Patient Instructions (Signed)
Thank you so much for coming to visit today! Your A1C was 8.1 today. We will start Trulicity, which is once weekly. Please let me know if you have any problems picking up this medication. You may continue to check your blood sugar once a day in the mornings. Please return in 3 months to repeat your A1C. We may need to increase the Trulicity at that time.  Please continue to work on diet ane exercise.  Dr. Gerlean Ren  Dulaglutide injection What is this medicine? DULAGLUTIDE (DOO la GLOO tide) is used to improve blood sugar control in adults with type 2 diabetes. This medicine may be used with other oral diabetes medicines. This medicine may be used for other purposes; ask your health care provider or pharmacist if you have questions. What should I tell my health care provider before I take this medicine? They need to know if you have any of these conditions: -endocrine tumors (MEN 2) or if someone in your family had these tumors -history of pancreatitis -kidney disease -liver disease -stomach problems -thyroid cancer or if someone in your family had thyroid cancer -an unusual or allergic reaction to dulaglutide, other medicines, foods, dyes, or preservatives -pregnant or trying to get pregnant -breast-feeding How should I use this medicine? This medicine is for injection under the skin of your upper leg (thigh), stomach area, or upper arm. It is usually given once every week (every 7 days). You will be taught how to prepare and give this medicine. Use exactly as directed. Take your medicine at regular intervals. Do not take it more often than directed. If you use this medicine with insulin, you should inject this medicine and the insulin separately. Do not mix them together. Do not give the injections right next to each other. Change (rotate) injection sites with each injection. It is important that you put your used needles and syringes in a special sharps container. Do not put them in a trash  can. If you do not have a sharps container, call your pharmacist or healthcare provider to get one. A special MedGuide will be given to you by the pharmacist with each prescription and refill. Be sure to read this information carefully each time. Talk to your pediatrician regarding the use of this medicine in children. Special care may be needed. Overdosage: If you think you have taken too much of this medicine contact a poison control center or emergency room at once. NOTE: This medicine is only for you. Do not share this medicine with others. What if I miss a dose? If you miss a dose, take it as soon as you can within 3 days after the missed dose. Then take your next dose at your regular weekly time. If it has been longer than 3 days after the missed dose, do not take the missed dose. Take the next dose at your regular time. Do not take double or extra doses. If you have questions about a missed dose, contact your health care provider for advice. What may interact with this medicine? Do not take this medicine with any of the following medications: -gatifloxacin Many medications may cause changes in blood sugar, these include: -alcohol containing beverages -aspirin and aspirin-like drugs -chloramphenicol -chromium -diuretics -female hormones, such as estrogens or progestins, birth control pills -heart medicines -isoniazid -female hormones or anabolic steroids -medications for weight loss -medicines for allergies, asthma, cold, or cough -medicines for mental problems -medicines called MAO inhibitors - Nardil, Parnate, Marplan, Eldepryl -niacin -NSAIDS, such as ibuprofen -  pentamidine -phenytoin -probenecid -quinolone antibiotics such as ciprofloxacin, levofloxacin, ofloxacin -some herbal dietary supplements -steroid medicines such as prednisone or cortisone -thyroid hormonesSome medications can hide the warning symptoms of low blood sugar (hypoglycemia). You may need to monitor your  blood sugar more closely if you are taking one of these medications. These include: -beta-blockers, often used for high blood pressure or heart problems (examples include atenolol, metoprolol, propranolol) -clonidine -guanethidine -reserpine This list may not describe all possible interactions. Give your health care provider a list of all the medicines, herbs, non-prescription drugs, or dietary supplements you use. Also tell them if you smoke, drink alcohol, or use illegal drugs. Some items may interact with your medicine. What should I watch for while using this medicine? Visit your doctor or health care professional for regular checks on your progress. A test called the HbA1C (A1C) will be monitored. This is a simple blood test. It measures your blood sugar control over the last 2 to 3 months. You will receive this test every 3 to 6 months. Learn how to check your blood sugar. Learn the symptoms of low and high blood sugar and how to manage them. Always carry a quick-source of sugar with you in case you have symptoms of low blood sugar. Examples include hard sugar candy or glucose tablets. Make sure others know that you can choke if you eat or drink when you develop serious symptoms of low blood sugar, such as seizures or unconsciousness. They must get medical help at once. Tell your doctor or health care professional if you have high blood sugar. You might need to change the dose of your medicine. If you are sick or exercising more than usual, you might need to change the dose of your medicine. Do not skip meals. Ask your doctor or health care professional if you should avoid alcohol. Many nonprescription cough and cold products contain sugar or alcohol. These can affect blood sugar. Wear a medical ID bracelet or chain, and carry a card that describes your disease and details of your medicine and dosage times. What side effects may I notice from receiving this medicine? Side effects that you should  report to your doctor or health care professional as soon as possible: -allergic reactions like skin rash, itching or hives, swelling of the face, lips, or tongue -breathing problems -signs and symptoms of low blood sugar such as feeling anxious, confusion, dizziness, increased hunger, unusually weak or tired, sweating, shakiness, cold, irritable, headache, blurred vision, fast heartbeat, loss of consciousness -unusual stomach upset or pain -vomiting Side effects that usually do not require medical attention (Report these to your doctor or health care professional if they continue or are bothersome.):diarrhea -heartburn -loss of appetite -nausea -pain, redness, or irritation at site where injected This list may not describe all possible side effects. Call your doctor for medical advice about side effects. You may report side effects to FDA at 1-800-FDA-1088. Where should I keep my medicine? Keep out of the reach of children. Store this medicine in a refrigerator between 2 and 8 degrees C (36 and 46 degrees F). Do not freeze or use if the medicine has been frozen. Protect from light and excessive heat. Each single-dose pen or prefilled syringe can be kept at room temperature, not to exceed 30 degrees C (86 degrees F) for a total of 14 days, if needed. Store in the carton until use. Throw away any unused medicine after the expiration date. NOTE: This sheet is a summary. It may  not cover all possible information. If you have questions about this medicine, talk to your doctor, pharmacist, or health care provider.    2016, Elsevier/Gold Standard. (2013-10-10 13:53:28)

## 2016-10-12 NOTE — Patient Instructions (Signed)
blood tests are requested for you today.  We'll let you know about the results.  Please come back for a follow-up appointment in 1 month.  

## 2016-10-12 NOTE — Progress Notes (Signed)
Subjective:    Patient ID: Anna Hawkins, female    DOB: March 28, 1976, 40 y.o.   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  She was recently in the ER for mild hypoglycemia.  Now, pt states she feels well in general.   Past Medical History:  Diagnosis Date  . Asthma   . Chlamydia   . Diabetes mellitus   . Fatty liver   . Graves disease   . History of shoulder dystocia in prior pregnancy   . Obesity   . Pregnancy induced hypertension    diagnosed with pregnancy-started on aldomet-delivered 08/28/11    Past Surgical History:  Procedure Laterality Date  . LAPAROSCOPIC TUBAL LIGATION  10/29/2011   Procedure: LAPAROSCOPIC TUBAL LIGATION;  Surgeon: Mora Bellman, MD;  Location: [facility];  Service: Gynecology;  Laterality: Bilateral;  . THERAPEUTIC ABORTION    . WISDOM TOOTH EXTRACTION      Social History   Social History  . Marital status: Single    Spouse name: N/A  . Number of children: N/A  . Years of education: N/A   Occupational History  . Not on file.   Social History Main Topics  . Smoking status: Never Smoker  . Smokeless tobacco: Never Used  . Alcohol use No  . Drug use: No  . Sexual activity: Yes    Birth control/ protection: Surgical   Other Topics Concern  . Not on file   Social History Narrative  . No narrative on file    Current Outpatient Prescriptions on File Prior to Visit  Medication Sig Dispense Refill  . albuterol (PROVENTIL HFA;VENTOLIN HFA) 108 (90 BASE) MCG/ACT inhaler Inhale 1-2 puffs into the lungs every 4 (four) hours as needed for wheezing or shortness of breath. 18 g 4  . fexofenadine (ALLEGRA ALLERGY) 180 MG tablet Take 1 tablet (180 mg total) by mouth daily. (Patient taking differently: Take 180 mg by mouth daily as needed for allergies. ) 30 tablet 3  .  fluticasone (FLONASE) 50 MCG/ACT nasal spray Place 2 sprays into both nostrils daily. (Patient taking differently: Place 2 sprays into both nostrils daily as needed for allergies. ) 16 g 6  . glucose blood test strip Use as instructed 100 each 12  . Lancet Device MISC 1 Device by Does not apply route daily. 90 each 3  . Lancets (ACCU-CHEK MULTICLIX) lancets Use as instructed 100 each 12  . losartan-hydrochlorothiazide (HYZAAR) 100-25 MG tablet TAKE 1 TABLET BY MOUTH DAILY. 30 tablet 7  . metoprolol succinate (TOPROL-XL) 100 MG 24 hr tablet Take 1 tablet (100 mg total) by mouth daily. Take with or immediately following a meal. 90 tablet 3   No current facility-administered medications on file prior to visit.     Allergies  Allergen Reactions  . Lisinopril Rash and Cough    Family History  Problem Relation Age of Onset  . Diabetes Mother   . Hypertension Mother   . Asthma Mother   . Thyroid disease Mother   . Diabetes Brother   . Hypertension Brother   . Asthma Brother   . Diabetes Father   . Cancer Father   . Asthma Father   . Hypertension Father     BP 128/80   Pulse 79   Ht 5\' 10"  (  1.778 m)   Wt 295 lb (133.8 kg)   LMP 10/03/2016 (Exact Date)   SpO2 98%   BMI 42.33 kg/m    Review of Systems Denies fever.      Objective:   Physical Exam VITAL SIGNS:  See vs page GENERAL: no distress Thyroid is 2-3 times normal size.  No palpable nodule.    Skin: not diaphoretic Neuro: no tremor  Lab Results  Component Value Date   TSH 0.03 (L) 10/12/2016      Assessment & Plan:  Hyperthyroidism, not better yet Please come back for a follow-up appointment in 1 month.

## 2016-11-15 NOTE — Progress Notes (Signed)
Subjective:    Patient ID: Anna Hawkins, female    DOB: 12-12-1976, 40 y.o.   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  pt states she feels well in general. Past Medical History:  Diagnosis Date  . Asthma   . Chlamydia   . Diabetes mellitus   . Fatty liver   . Graves disease   . History of shoulder dystocia in prior pregnancy   . Obesity   . Pregnancy induced hypertension    diagnosed with pregnancy-started on aldomet-delivered 08/28/11    Past Surgical History:  Procedure Laterality Date  . LAPAROSCOPIC TUBAL LIGATION  10/29/2011   Procedure: LAPAROSCOPIC TUBAL LIGATION;  Surgeon: Mora Bellman, MD;  Location: [facility];  Service: Gynecology;  Laterality: Bilateral;  . THERAPEUTIC ABORTION    . WISDOM TOOTH EXTRACTION      Social History   Social History  . Marital status: Single    Spouse name: N/A  . Number of children: N/A  . Years of education: N/A   Occupational History  . Not on file.   Social History Main Topics  . Smoking status: Never Smoker  . Smokeless tobacco: Never Used  . Alcohol use No  . Drug use: No  . Sexual activity: Yes    Birth control/ protection: Surgical   Other Topics Concern  . Not on file   Social History Narrative  . No narrative on file    Current Outpatient Prescriptions on File Prior to Visit  Medication Sig Dispense Refill  . albuterol (PROVENTIL HFA;VENTOLIN HFA) 108 (90 BASE) MCG/ACT inhaler Inhale 1-2 puffs into the lungs every 4 (four) hours as needed for wheezing or shortness of breath. 18 g 4  . blood glucose meter kit and supplies KIT Dispense based on patient and insurance preference. Use up to four times daily as directed. (FOR ICD-9 250.00, 250.01). 1 each 0  . Dulaglutide (TRULICITY) 8.26 EB/5.8XE SOPN Inject 0.75 mg into the skin once a  week. 3 pen 4  . fexofenadine (ALLEGRA ALLERGY) 180 MG tablet Take 1 tablet (180 mg total) by mouth daily. (Patient taking differently: Take 180 mg by mouth daily as needed for allergies. ) 30 tablet 3  . fluticasone (FLONASE) 50 MCG/ACT nasal spray Place 2 sprays into both nostrils daily. (Patient taking differently: Place 2 sprays into both nostrils daily as needed for allergies. ) 16 g 6  . glucose blood test strip Use as instructed 100 each 12  . Lancet Device MISC 1 Device by Does not apply route daily. 90 each 3  . Lancets (ACCU-CHEK MULTICLIX) lancets Use as instructed 100 each 12  . losartan-hydrochlorothiazide (HYZAAR) 100-25 MG tablet TAKE 1 TABLET BY MOUTH DAILY. 30 tablet 7  . metoprolol succinate (TOPROL-XL) 100 MG 24 hr tablet Take 1 tablet (100 mg total) by mouth daily. Take with or immediately following a meal. 90 tablet 3   No current facility-administered medications on file prior to visit.     Allergies  Allergen Reactions  . Lisinopril Rash and Cough    Family History  Problem Relation Age of Onset  . Diabetes Mother   . Hypertension Mother   . Asthma Mother   . Thyroid disease Mother   . Diabetes Brother   . Hypertension Brother   .  Asthma Brother   . Diabetes Father   . Cancer Father   . Asthma Father   . Hypertension Father     BP 126/84   Pulse 88   Ht '5\' 6"'  (1.676 m)   Wt 300 lb (136.1 kg)   SpO2 98%   BMI 48.42 kg/m    Review of Systems She denies palpitations and tremor.      Objective:   Physical Exam VITAL SIGNS:  See vs page GENERAL: no distress Thyroid is 2-3 times normal size (R>L).  No palpable nodule.    Skin: not diaphoretic Neuro: no tremor       Assessment & Plan:  Hyperthyroidism, due for recheck Patient is advised the following: Patient Instructions  blood tests are requested for you today.  We'll let you know about the results.  Please come back for a follow-up appointment in 6 weeks.

## 2016-11-16 ENCOUNTER — Other Ambulatory Visit: Payer: Self-pay | Admitting: Endocrinology

## 2016-11-16 ENCOUNTER — Ambulatory Visit (INDEPENDENT_AMBULATORY_CARE_PROVIDER_SITE_OTHER): Payer: 59 | Admitting: Endocrinology

## 2016-11-16 ENCOUNTER — Encounter: Payer: Self-pay | Admitting: Endocrinology

## 2016-11-16 VITALS — BP 126/84 | HR 88 | Ht 66.0 in | Wt 300.0 lb

## 2016-11-16 DIAGNOSIS — E0590 Thyrotoxicosis, unspecified without thyrotoxic crisis or storm: Secondary | ICD-10-CM

## 2016-11-16 LAB — TSH: TSH: 0.07 u[IU]/mL — ABNORMAL LOW (ref 0.35–4.50)

## 2016-11-16 LAB — T4, FREE: FREE T4: 0.79 ng/dL (ref 0.60–1.60)

## 2016-11-16 IMAGING — US US ABDOMEN COMPLETE
1 series · 14 of 25 positions shown · non-contrast
Comparison: None.

CLINICAL DATA: Right upper quadrant pain.

EXAM:
ULTRASOUND ABDOMEN COMPLETE

[Series 1: us abdomen complete · 0.20mm/px · 14 of 71 slices shown]
[im 1/71]
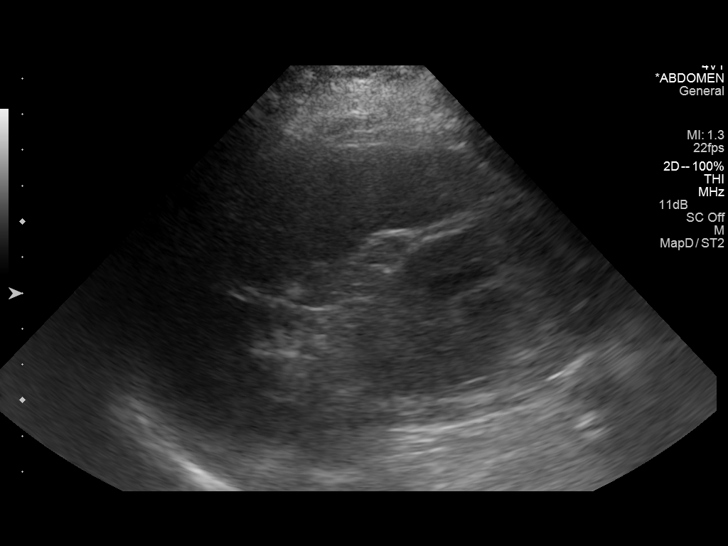
[im 6/71]
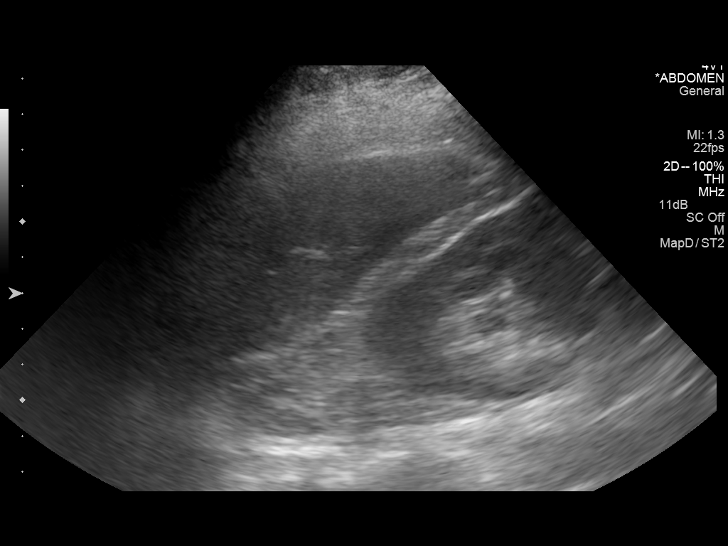
[im 12/71]
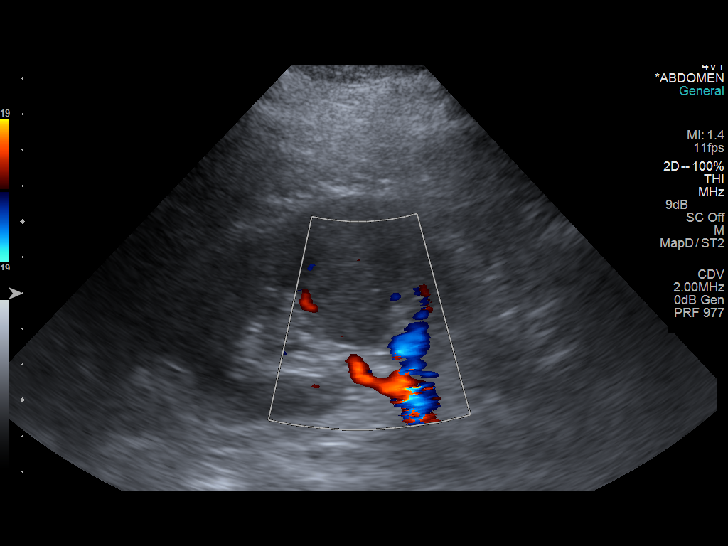
[im 18/71]
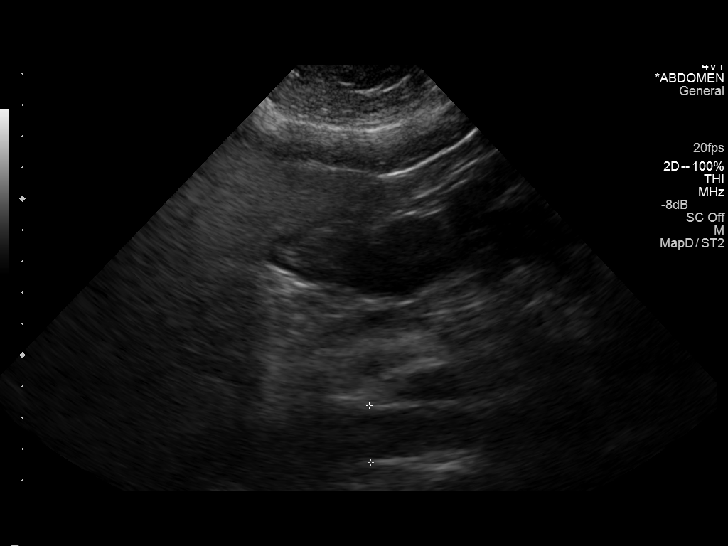
[im 24/71]
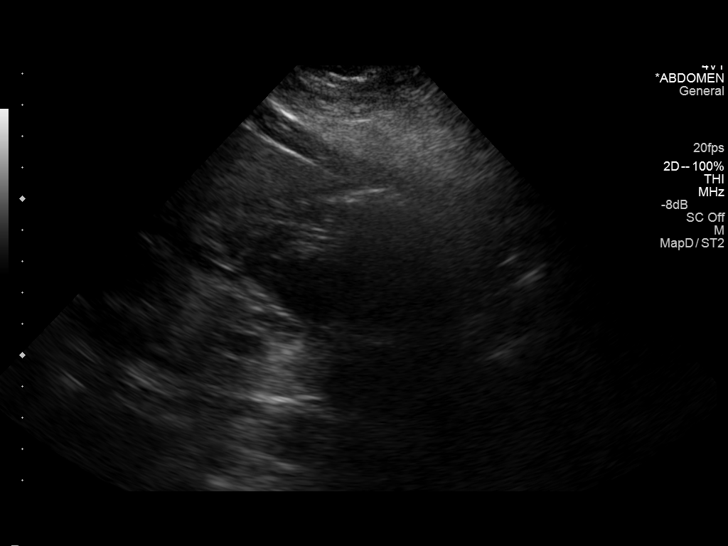
[im 27/71]
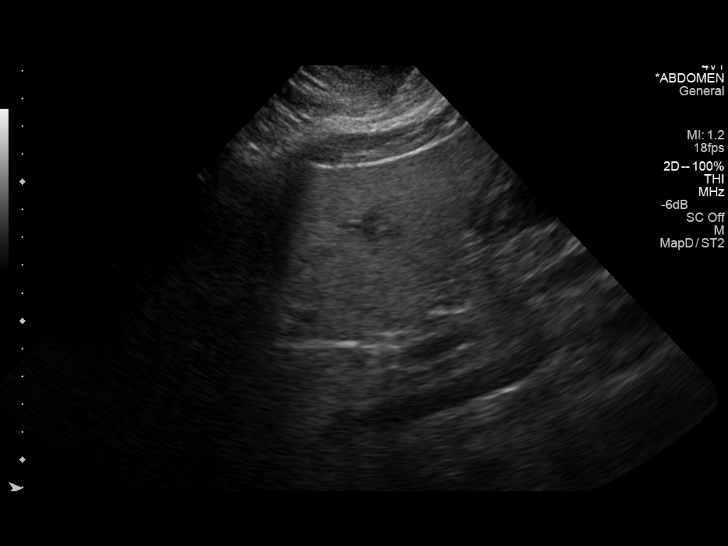
[im 33/71]
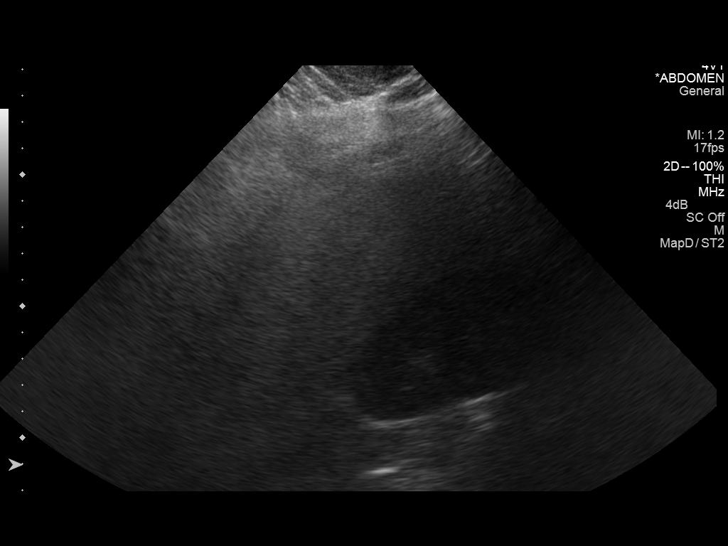
[im 38/71]
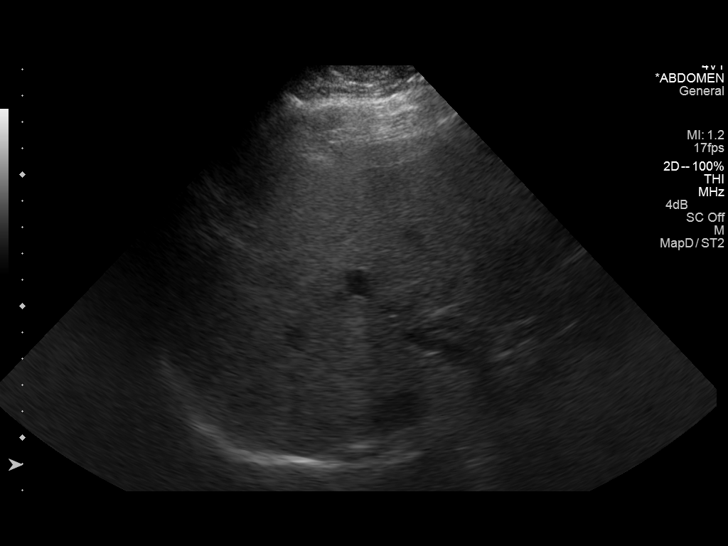
[im 44/71]
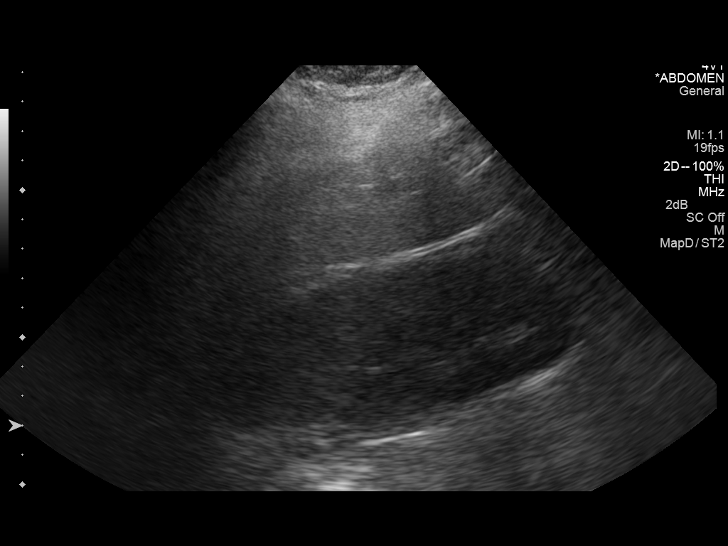
[im 47/71]
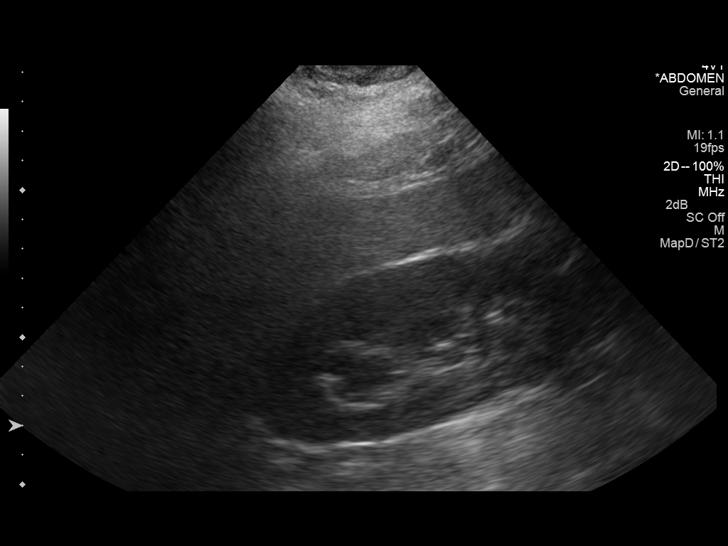
[im 53/71]
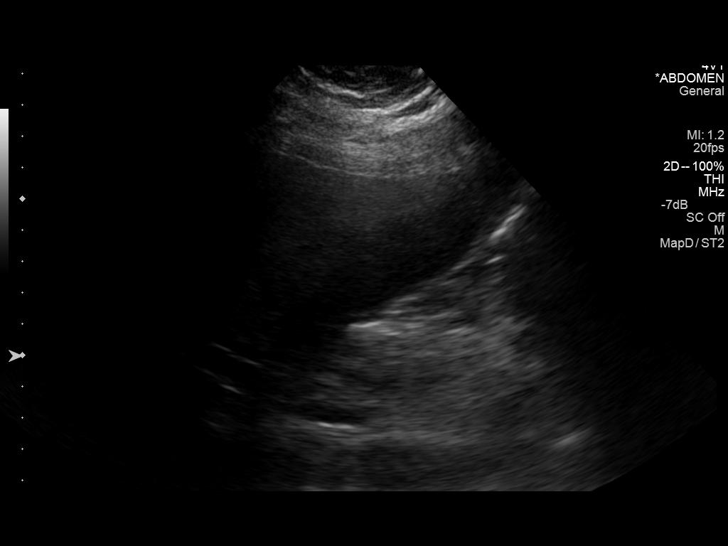
[im 59/71]
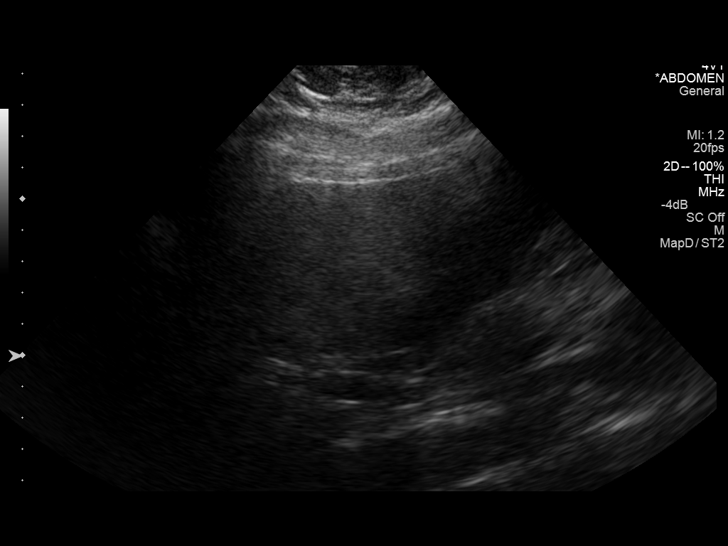
[im 65/71]
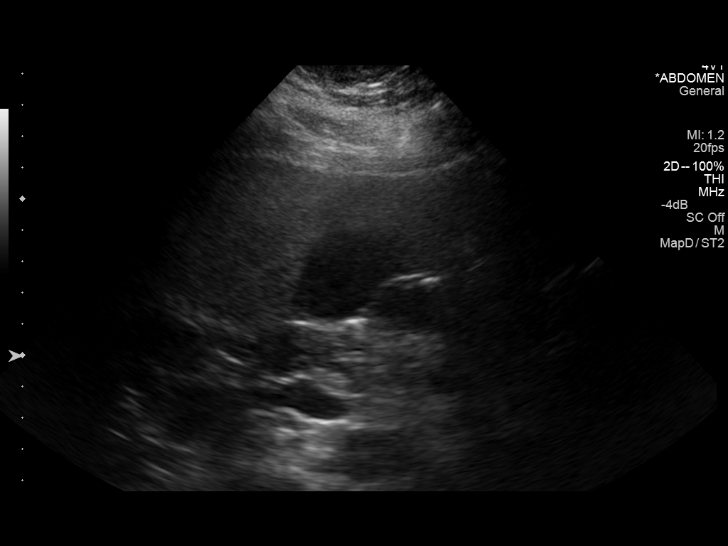
[im 71/71]
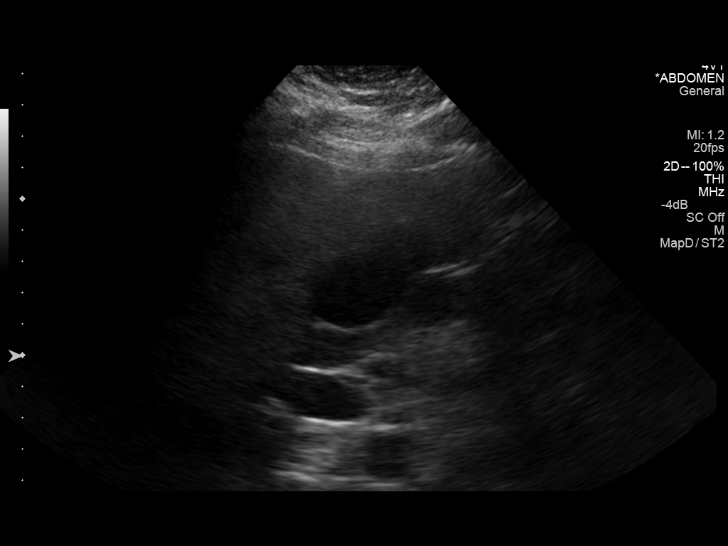

[14 of 25 positions shown; findings below may reference images not displayed]

FINDINGS: Gallbladder: No gallstones or wall thickening visualized. No
sonographic Murphy sign noted.

Common bile duct: Diameter: 6 mm, normal

Liver: Liver demonstrates increased parenchymal echotexture
diffusely consistent with diffuse fatty infiltration. No focal liver
lesions are identified.

IVC: No abnormality visualized.

Pancreas: Not visualized due to overlying bowel gas.

Spleen: Size and appearance within normal limits.

Right Kidney: Length: 12.8 cm.. Mild pyelocaliectasis. Normal
parenchymal echotexture and thickness.

Left Kidney: Length: 12.5 cm. Echogenicity within normal limits. No
mass or hydronephrosis visualized.

Abdominal aorta: No aneurysm visualized.

Other findings: None.
IMPRESSION: Diffuse fatty infiltration of the liver. Normal appearance of
gallbladder and bile ducts. Mild pyelocaliectasis in the right
kidney of nonspecific etiology. Right ureter is not well visualized.

## 2016-11-16 NOTE — Addendum Note (Signed)
Addended by: Kaylyn Lim I on: 11/16/2016 08:12 AM   Modules accepted: Orders

## 2016-11-16 NOTE — Patient Instructions (Addendum)
blood tests are requested for you today.  We'll let you know about the results.  Please come back for a follow-up appointment in 6 weeks.  

## 2016-11-21 ENCOUNTER — Other Ambulatory Visit: Payer: Self-pay | Admitting: Family Medicine

## 2016-12-27 NOTE — Progress Notes (Signed)
Subjective:    Patient ID: Anna Hawkins, female    DOB: 1976-10-02, 41 y.o.   MRN: [id number]  HPI Pt returns for f/u of hyperthyroidism (dx'ed 2017; she had been noted to have a goiter in 2002; US showed features of both Grave's Dz and nodular goiter; she has had TL; she had RAI in July, 2017).  pt states she feels well in general.   Past Medical History:  Diagnosis Date  . Asthma   . Chlamydia   . Diabetes mellitus   . Fatty liver   . Graves disease   . History of shoulder dystocia in prior pregnancy   . Obesity   . Pregnancy induced hypertension    diagnosed with pregnancy-started on aldomet-delivered 08/28/11    Past Surgical History:  Procedure Laterality Date  . LAPAROSCOPIC TUBAL LIGATION  10/29/2011   Procedure: LAPAROSCOPIC TUBAL LIGATION;  Surgeon: Mora Bellman, MD;  Location: [facility];  Service: Gynecology;  Laterality: Bilateral;  . THERAPEUTIC ABORTION    . WISDOM TOOTH EXTRACTION      Social History   Social History  . Marital status: Single    Spouse name: N/A  . Number of children: N/A  . Years of education: N/A   Occupational History  . Not on file.   Social History Main Topics  . Smoking status: Never Smoker  . Smokeless tobacco: Never Used  . Alcohol use No  . Drug use: No  . Sexual activity: Yes    Birth control/ protection: Surgical   Other Topics Concern  . Not on file   Social History Narrative  . No narrative on file    Current Outpatient Prescriptions on File Prior to Visit  Medication Sig Dispense Refill  . albuterol (PROVENTIL HFA;VENTOLIN HFA) 108 (90 BASE) MCG/ACT inhaler Inhale 1-2 puffs into the lungs every 4 (four) hours as needed for wheezing or shortness of breath. 18 g 4  . blood glucose meter kit and supplies KIT Dispense based on patient and insurance preference. Use up to four times daily as directed. (FOR ICD-9 250.00, 250.01). 1 each 0  . Dulaglutide (TRULICITY) 8.67 YP/9.5KD SOPN Inject 0.75 mg into the skin once a  week. 3 pen 4  . fexofenadine (ALLEGRA ALLERGY) 180 MG tablet Take 1 tablet (180 mg total) by mouth daily. (Patient taking differently: Take 180 mg by mouth daily as needed for allergies. ) 30 tablet 3  . fluticasone (FLONASE) 50 MCG/ACT nasal spray Place 2 sprays into both nostrils daily. (Patient taking differently: Place 2 sprays into both nostrils daily as needed for allergies. ) 16 g 6  . glucose blood test strip Use as instructed 100 each 12  . Lancet Device MISC 1 Device by Does not apply route daily. 90 each 3  . Lancets (ACCU-CHEK MULTICLIX) lancets Use as instructed 100 each 12  . losartan-hydrochlorothiazide (HYZAAR) 100-25 MG tablet TAKE 1 TABLET BY MOUTH DAILY. 90 tablet 3  . metoprolol succinate (TOPROL-XL) 100 MG 24 hr tablet Take 1 tablet (100 mg total) by mouth daily. Take with or immediately following a meal. 90 tablet 3   No current facility-administered medications on file prior to visit.     Allergies  Allergen Reactions  . Lisinopril Rash and Cough    Family History  Problem Relation Age of Onset  . Diabetes Mother   . Hypertension Mother   . Asthma Mother   . Thyroid disease Mother   . Diabetes Brother   . Hypertension Brother   .  Asthma Brother   . Diabetes Father   . Cancer Father   . Asthma Father   . Hypertension Father     BP (!) 144/84   Pulse 90   Ht '5\' 6"'  (1.676 m)   Wt (!) 308 lb (139.7 kg)   SpO2 98%   BMI 49.71 kg/m    Review of Systems She has slight leg edema.      Objective:   Physical Exam VITAL SIGNS:  See vs page GENERAL: no distress NECK: There is no palpable thyroid enlargement.  No thyroid nodule is palpable.  No palpable lymphadenopathy at the anterior neck.  Skin: not diaphoretic Neuro: no tremor.      Assessment & Plan:  Hyperthyroidism, s/p RAI, due for recheck Patient is advised the following: Patient Instructions  blood tests are requested for you today.  We'll let you know about the results.  Please come back  for a follow-up appointment in 2 months.

## 2016-12-28 ENCOUNTER — Encounter: Payer: Self-pay | Admitting: Endocrinology

## 2016-12-28 ENCOUNTER — Ambulatory Visit (INDEPENDENT_AMBULATORY_CARE_PROVIDER_SITE_OTHER): Payer: 59 | Admitting: Endocrinology

## 2016-12-28 ENCOUNTER — Other Ambulatory Visit: Payer: Self-pay | Admitting: Endocrinology

## 2016-12-28 VITALS — BP 144/84 | HR 90 | Ht 66.0 in | Wt 308.0 lb

## 2016-12-28 DIAGNOSIS — E0590 Thyrotoxicosis, unspecified without thyrotoxic crisis or storm: Secondary | ICD-10-CM

## 2016-12-28 LAB — T4, FREE: FREE T4: 0.45 ng/dL — AB (ref 0.60–1.60)

## 2016-12-28 LAB — TSH: TSH: 9.43 u[IU]/mL — ABNORMAL HIGH (ref 0.35–4.50)

## 2016-12-28 MED ORDER — LEVOTHYROXINE SODIUM 75 MCG PO TABS: 75.0000 ug | ORAL_TABLET | Freq: Every day | ORAL | 4 refills | Status: DC

## 2016-12-28 NOTE — Patient Instructions (Addendum)
blood tests are requested for you today.  We'll let you know about the results.   Please come back for a follow-up appointment in 2 months.  

## 2017-01-17 ENCOUNTER — Other Ambulatory Visit: Payer: Self-pay | Admitting: Endocrinology

## 2017-01-19 ENCOUNTER — Encounter: Payer: Self-pay | Admitting: Endocrinology

## 2017-01-19 ENCOUNTER — Ambulatory Visit (INDEPENDENT_AMBULATORY_CARE_PROVIDER_SITE_OTHER): Payer: 59 | Admitting: Endocrinology

## 2017-01-19 DIAGNOSIS — E039 Hypothyroidism, unspecified: Secondary | ICD-10-CM | POA: Insufficient documentation

## 2017-01-19 DIAGNOSIS — E890 Postprocedural hypothyroidism: Secondary | ICD-10-CM | POA: Diagnosis not present

## 2017-01-19 LAB — TSH: TSH: 8.29 u[IU]/mL — AB (ref 0.35–4.50)

## 2017-01-19 LAB — T4, FREE: FREE T4: 0.67 ng/dL (ref 0.60–1.60)

## 2017-01-19 MED ORDER — LEVOTHYROXINE SODIUM 112 MCG PO TABS: 112.0000 ug | ORAL_TABLET | Freq: Every day | ORAL | 5 refills | Status: DC

## 2017-01-19 MED ORDER — METOPROLOL SUCCINATE ER 50 MG PO TB24: 50.0000 mg | ORAL_TABLET | Freq: Every day | ORAL | 3 refills | Status: DC

## 2017-01-19 NOTE — Progress Notes (Signed)
Subjective:    Patient ID: Anna Hawkins, female    DOB: 1976/09/29, 41 y.o.   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  pt states she feels well in general, except for fatigue.  Past Medical History:  Diagnosis Date  . Asthma   . Chlamydia   . Diabetes mellitus   . Fatty liver   . Graves disease   . History of shoulder dystocia in prior pregnancy   . Obesity   . Pregnancy induced hypertension    diagnosed with pregnancy-started on aldomet-delivered 08/28/11    Past Surgical History:  Procedure Laterality Date  . LAPAROSCOPIC TUBAL LIGATION  10/29/2011   Procedure: LAPAROSCOPIC TUBAL LIGATION;  Surgeon: Mora Bellman, MD;  Location: [facility];  Service: Gynecology;  Laterality: Bilateral;  . THERAPEUTIC ABORTION    . WISDOM TOOTH EXTRACTION      Social History   Social History  . Marital status: Single    Spouse name: N/A  . Number of children: N/A  . Years of education: N/A   Occupational History  . Not on file.   Social History Main Topics  . Smoking status: Never Smoker  . Smokeless tobacco: Never Used  . Alcohol use No  . Drug use: No  . Sexual activity: Yes    Birth control/ protection: Surgical   Other Topics Concern  . Not on file   Social History Narrative  . No narrative on file    Current Outpatient Prescriptions on File Prior to Visit  Medication Sig Dispense Refill  . albuterol (PROVENTIL HFA;VENTOLIN HFA) 108 (90 BASE) MCG/ACT inhaler Inhale 1-2 puffs into the lungs every 4 (four) hours as needed for wheezing or shortness of breath. 18 g 4  . blood glucose meter kit and supplies KIT Dispense based on patient and insurance preference. Use up to four times daily as directed. (FOR ICD-9 250.00, 250.01). 1 each 0  . Dulaglutide (TRULICITY)  1.94 RD/4.0CX SOPN Inject 0.75 mg into the skin once a week. 3 pen 4  . fexofenadine (ALLEGRA ALLERGY) 180 MG tablet Take 1 tablet (180 mg total) by mouth daily. (Patient taking differently: Take 180 mg by mouth daily as needed for allergies. ) 30 tablet 3  . fluticasone (FLONASE) 50 MCG/ACT nasal spray Place 2 sprays into both nostrils daily. (Patient taking differently: Place 2 sprays into both nostrils daily as needed for allergies. ) 16 g 6  . glucose blood test strip Use as instructed 100 each 12  . Lancet Device MISC 1 Device by Does not apply route daily. 90 each 3  . Lancets (ACCU-CHEK MULTICLIX) lancets Use as instructed 100 each 12  . losartan-hydrochlorothiazide (HYZAAR) 100-25 MG tablet TAKE 1 TABLET BY MOUTH DAILY. 90 tablet 3   No current facility-administered medications on file prior to visit.     Allergies  Allergen Reactions  . Lisinopril Rash and Cough    Family History  Problem Relation Age of Onset  . Diabetes Mother   . Hypertension Mother   . Asthma Mother   . Thyroid disease Mother   . Diabetes Brother   . Hypertension Brother   . Asthma Brother   . Diabetes Father   . Cancer Father   . Asthma Father   .  Hypertension Father     BP 136/82   Pulse 98   Ht '5\' 6"'  (1.676 m)   Wt (!) 312 lb (141.5 kg)   SpO2 97%   BMI 50.36 kg/m    Review of Systems Denies edema    Objective:   Physical Exam VITAL SIGNS:  See vs page GENERAL: no distress NECK: There is no palpable thyroid enlargement.  No thyroid nodule is palpable.  No palpable lymphadenopathy at the anterior neck.  Skin: not diaphoretic Neuro: no tremor.   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  I have sent a prescription to your pharmacy HTN: well-controlled

## 2017-01-19 NOTE — Patient Instructions (Addendum)
blood tests are requested for you today.  We'll let you know about the results.  I have sent a prescription to your pharmacy, to reduce the metoprolol for the thyroid.   You no longer need the metoprolol for your thyroid, but you may need to take this for your blood pressure.  Therefore, please ask Dr Gerlean Ren about this.   Please come back for a follow-up appointment in 3 months.

## 2017-01-20 ENCOUNTER — Ambulatory Visit: Payer: 59 | Admitting: Internal Medicine

## 2017-02-25 ENCOUNTER — Ambulatory Visit: Payer: 59 | Admitting: Endocrinology

## 2017-03-10 ENCOUNTER — Other Ambulatory Visit: Payer: Self-pay | Admitting: Family Medicine

## 2017-03-10 DIAGNOSIS — E119 Type 2 diabetes mellitus without complications: Secondary | ICD-10-CM

## 2017-04-12 ENCOUNTER — Encounter: Payer: Self-pay | Admitting: Endocrinology

## 2017-04-12 ENCOUNTER — Ambulatory Visit (INDEPENDENT_AMBULATORY_CARE_PROVIDER_SITE_OTHER): Payer: 59 | Admitting: Endocrinology

## 2017-04-12 VITALS — BP 132/86 | HR 104 | Ht 66.0 in | Wt 302.0 lb

## 2017-04-12 DIAGNOSIS — E890 Postprocedural hypothyroidism: Secondary | ICD-10-CM | POA: Diagnosis not present

## 2017-04-12 LAB — TSH: TSH: 0.74 u[IU]/mL (ref 0.35–4.50)

## 2017-04-12 NOTE — Progress Notes (Signed)
Subjective:    Patient ID: Anna Hawkins, female    DOB: 1976-07-08, 41 y.o.   MRN: [id number]  HPI Pt returns for f/u of hyperthyroidism (dx'ed 2017; she had been noted to have a goiter in 2002; US showed features of both Grave's Dz and multinodular goiter; she has had TL; she had RAI in July, 2017; she started synthroid in late 2017).  She has slight fullness sensation of the ant neck, but no assoc fatigue.  She stopped toprol.   Past Medical History:  Diagnosis Date  . Asthma   . Chlamydia   . Diabetes mellitus   . Fatty liver   . Graves disease   . History of shoulder dystocia in prior pregnancy   . Obesity   . Pregnancy induced hypertension    diagnosed with pregnancy-started on aldomet-delivered 08/28/11    Past Surgical History:  Procedure Laterality Date  . LAPAROSCOPIC TUBAL LIGATION  10/29/2011   Procedure: LAPAROSCOPIC TUBAL LIGATION;  Surgeon: Mora Bellman, MD;  Location: [facility];  Service: Gynecology;  Laterality: Bilateral;  . THERAPEUTIC ABORTION    . WISDOM TOOTH EXTRACTION      Social History   Social History  . Marital status: Single    Spouse name: N/A  . Number of children: N/A  . Years of education: N/A   Occupational History  . Not on file.   Social History Main Topics  . Smoking status: Never Smoker  . Smokeless tobacco: Never Used  . Alcohol use No  . Drug use: No  . Sexual activity: Yes    Birth control/ protection: Surgical   Other Topics Concern  . Not on file   Social History Narrative  . No narrative on file    Current Outpatient Prescriptions on File Prior to Visit  Medication Sig Dispense Refill  . albuterol (PROVENTIL HFA;VENTOLIN HFA) 108 (90 BASE) MCG/ACT inhaler Inhale 1-2 puffs into the lungs every 4 (four) hours as needed for wheezing or shortness of breath. 18 g 4  . blood glucose meter kit and supplies KIT Dispense based on patient and insurance preference. Use up to four times daily as directed. (FOR ICD-9 250.00,  250.01). 1 each 0  . fexofenadine (ALLEGRA ALLERGY) 180 MG tablet Take 1 tablet (180 mg total) by mouth daily. (Patient taking differently: Take 180 mg by mouth daily as needed for allergies. ) 30 tablet 3  . fluticasone (FLONASE) 50 MCG/ACT nasal spray Place 2 sprays into both nostrils daily. (Patient taking differently: Place 2 sprays into both nostrils daily as needed for allergies. ) 16 g 6  . glucose blood test strip Use as instructed 100 each 12  . Lancet Device MISC 1 Device by Does not apply route daily. 90 each 3  . Lancets (ACCU-CHEK MULTICLIX) lancets Use as instructed 100 each 12  . levothyroxine (SYNTHROID, LEVOTHROID) 112 MCG tablet Take 1 tablet (112 mcg total) by mouth daily before breakfast. 30 tablet 5  . losartan-hydrochlorothiazide (HYZAAR) 100-25 MG tablet TAKE 1 TABLET BY MOUTH DAILY. 90 tablet 3  . TRULICITY 1.32 GM/0.1UU SOPN INJECT 0.75 MG INTO THE SKIN ONCE A WEEK. 2 pen 4   No current facility-administered medications on file prior to visit.     Allergies  Allergen Reactions  . Lisinopril Rash and Cough    Family History  Problem Relation Age of Onset  . Diabetes Mother   . Hypertension Mother   . Asthma Mother   . Thyroid disease Mother   .  Diabetes Brother   . Hypertension Brother   . Asthma Brother   . Diabetes Father   . Cancer Father   . Asthma Father   . Hypertension Father     BP 132/86   Pulse (!) 104   Ht '5\' 6"'  (1.676 m)   Wt (!) 302 lb (137 kg)   SpO2 99%   BMI 48.74 kg/m    Review of Systems Denies leg edema.  She has lost a few lbs.    Objective:   Physical Exam VITAL SIGNS:  See vs page GENERAL: no distress.  NECK: There is no palpable thyroid enlargement.  No thyroid nodule is palpable.  No palpable lymphadenopathy at the anterior neck.  Skin: not diaphoretic.   Neuro: no tremor.   Lab Results  Component Value Date   TSH 0.74 04/12/2017      Assessment & Plan:  Hypothyroidism: well-replaced.  Please continue the same  medications Tachycardia, mild.  We'll follow.  Neck fullness, new.   Patient Instructions  blood tests are requested for you today.  We'll let you know about the results.  Let's recheck the ultrasound.  you will receive a phone call, about a day and time for an appointment.  Please come back for a follow-up appointment in 3-6 months.

## 2017-04-12 NOTE — Patient Instructions (Addendum)
blood tests are requested for you today.  We'll let you know about the results.  Let's recheck the ultrasound.  you will receive a phone call, about a day and time for an appointment.  Please come back for a follow-up appointment in 3-6 months.

## 2017-04-22 ENCOUNTER — Other Ambulatory Visit: Payer: 59

## 2017-06-21 IMAGING — CR DG CHEST 2V
2 series · 2 of 2 positions shown · non-contrast
Comparison: May 08, 2012

CLINICAL DATA: Shortness of breath and cough for 4 days

EXAM:
CHEST  2 VIEW

[w chest pa]
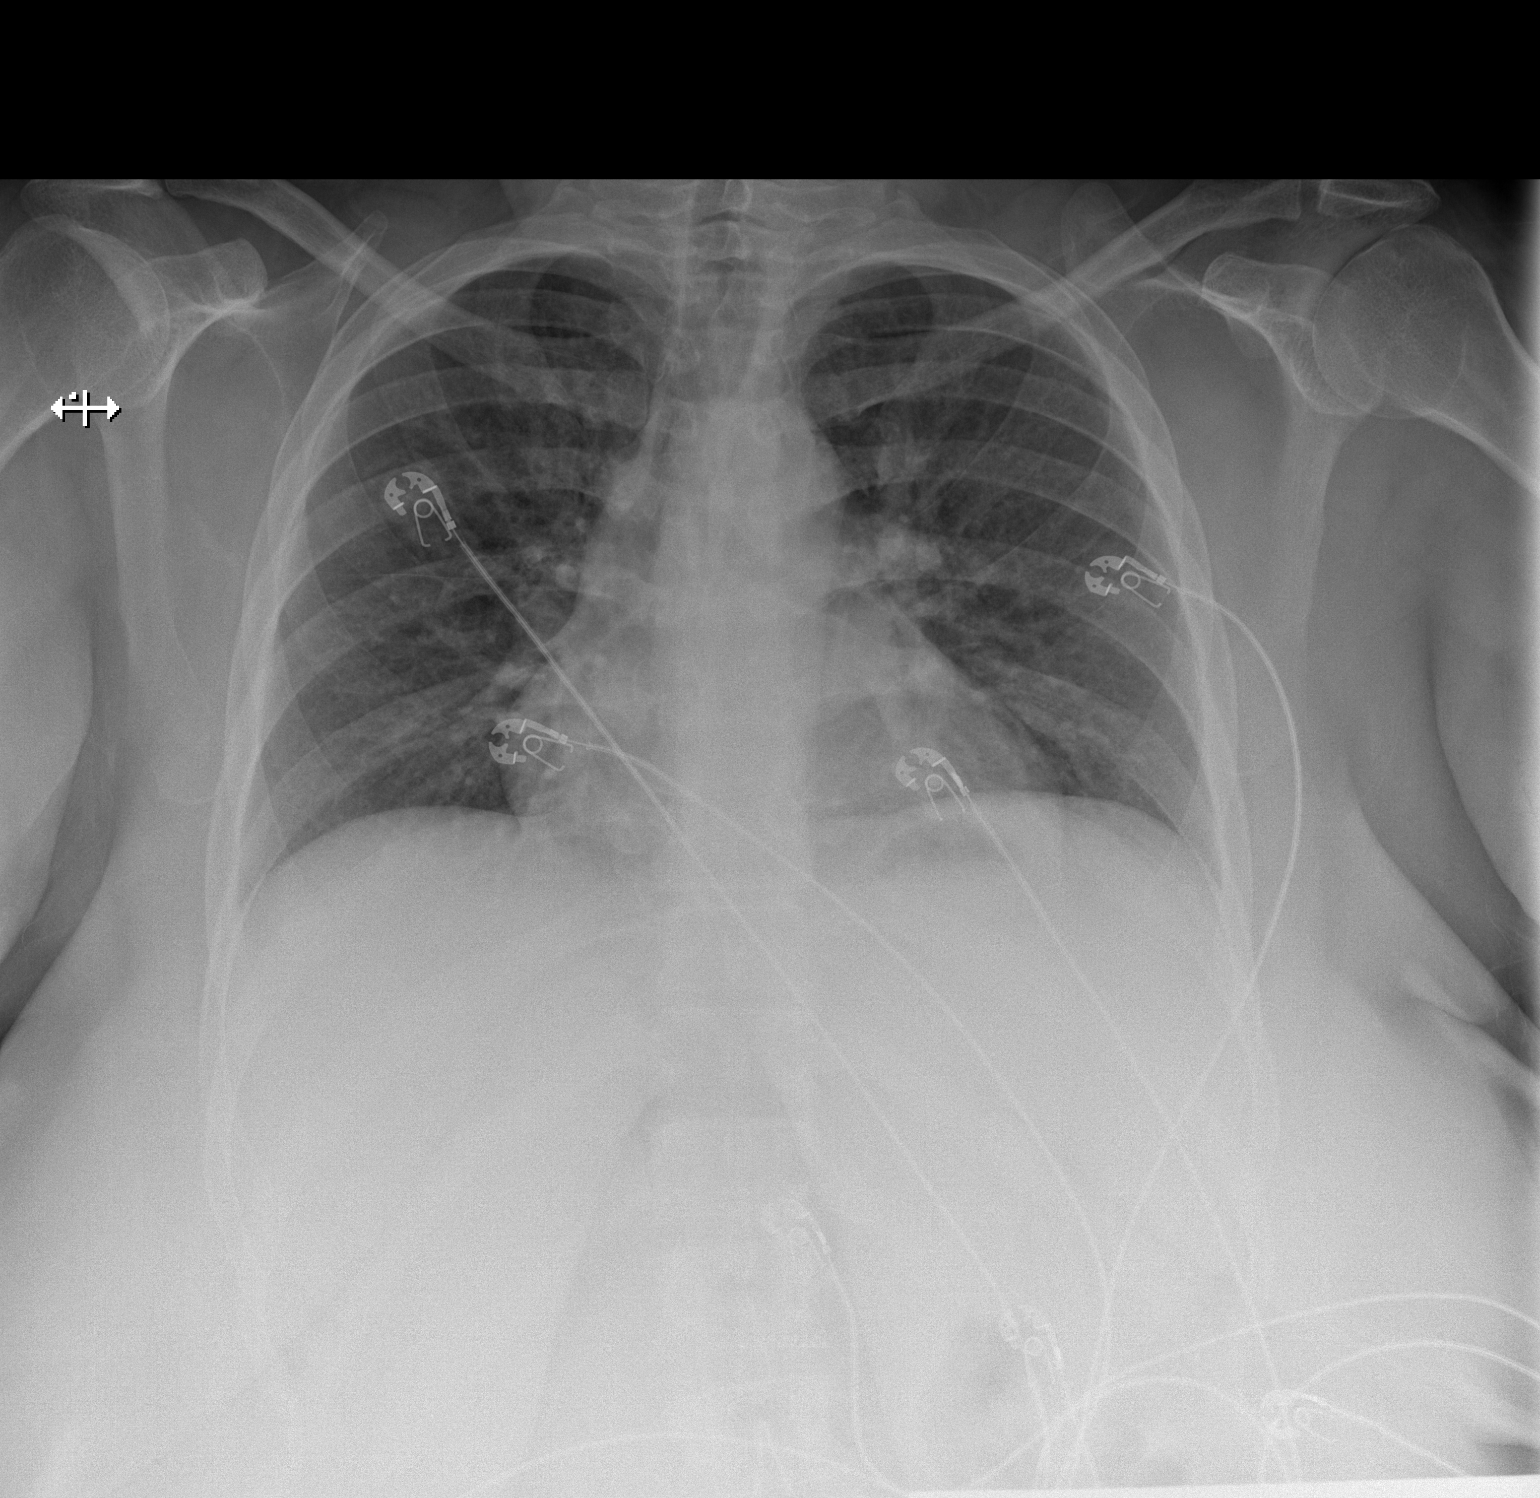

[w chest lat]
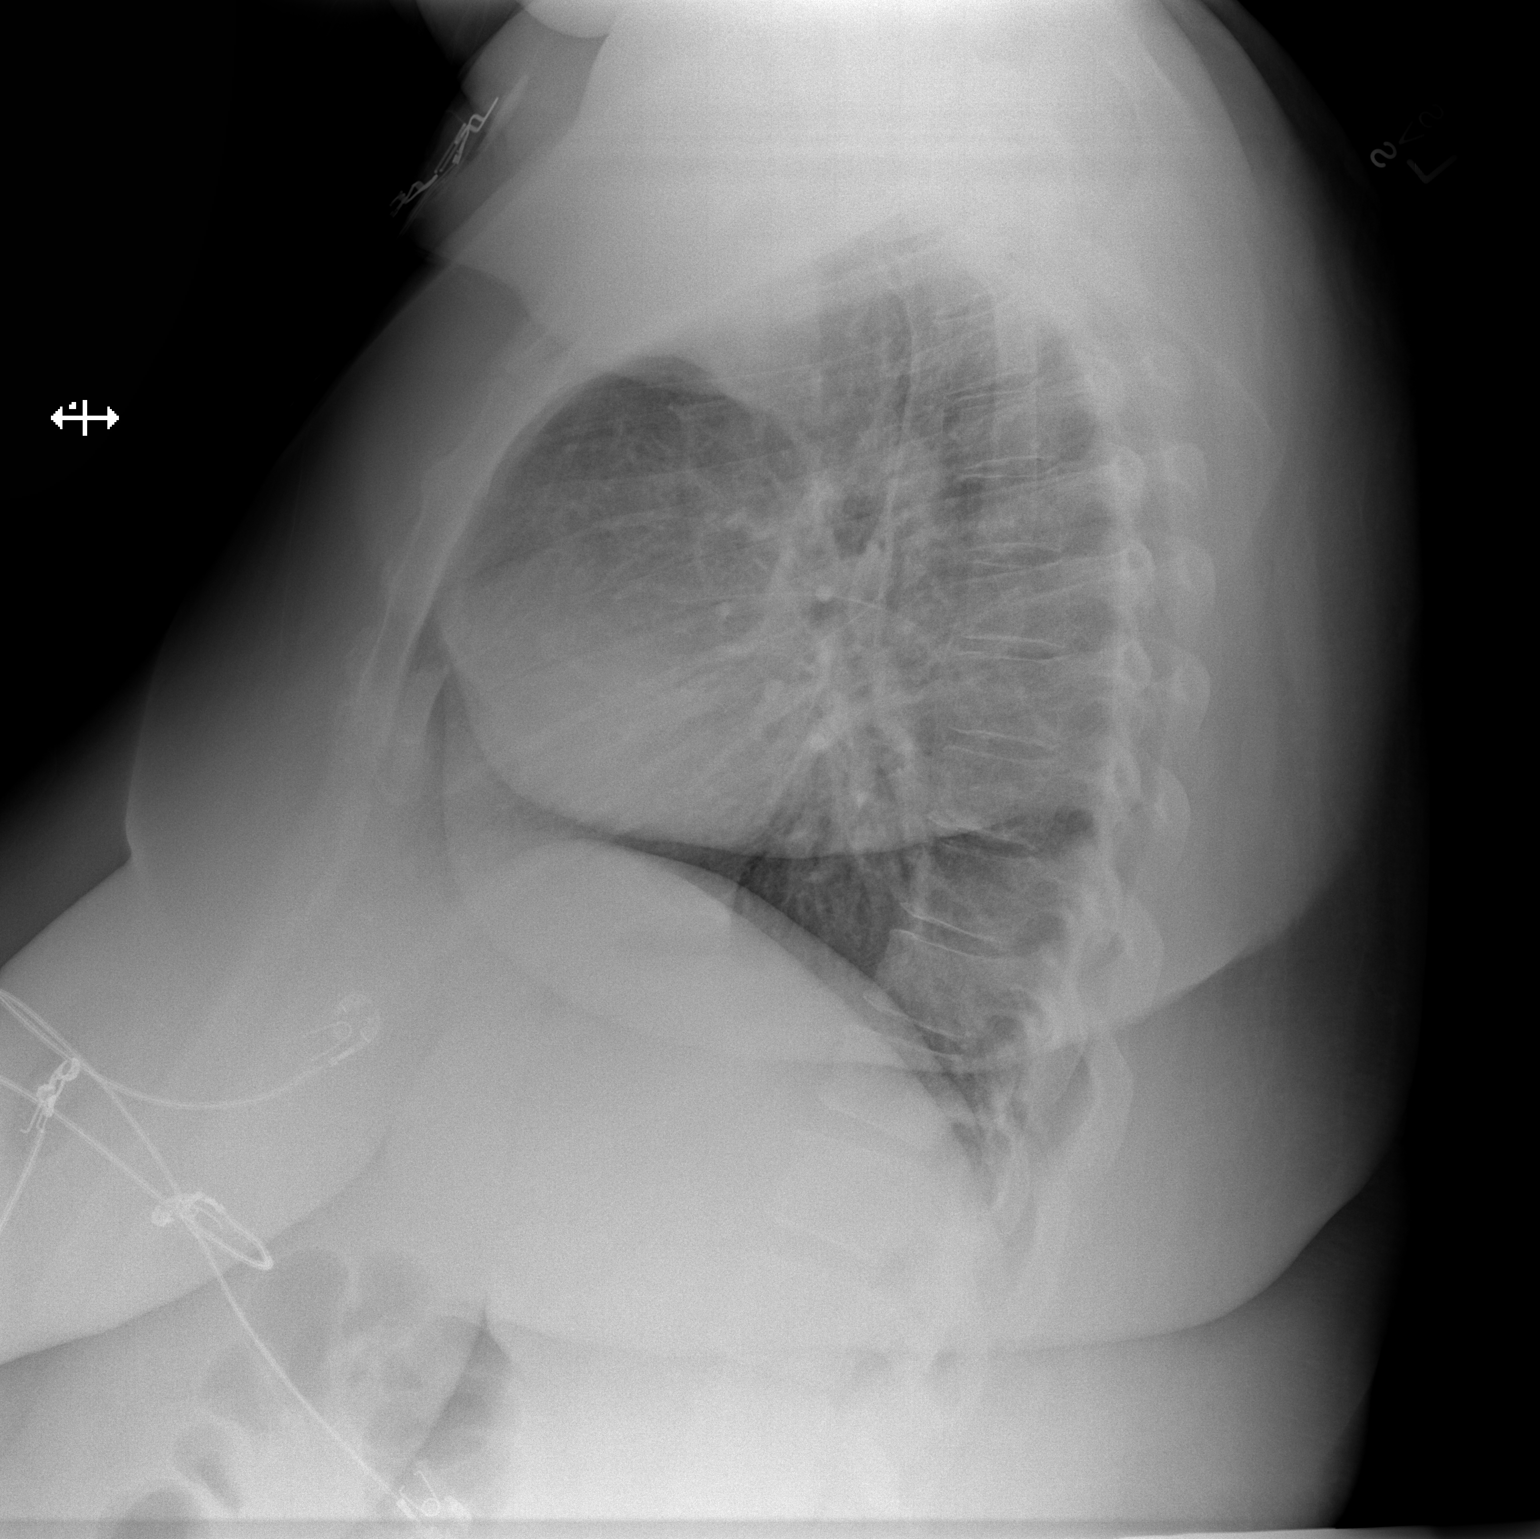

[2 of 2 positions shown; findings below may reference images not displayed]

FINDINGS: Lungs are clear. Heart size and pulmonary vascularity are normal. No
adenopathy. No bone lesions.
IMPRESSION: No edema or consolidation.

## 2017-07-13 ENCOUNTER — Ambulatory Visit (INDEPENDENT_AMBULATORY_CARE_PROVIDER_SITE_OTHER): Payer: 59 | Admitting: Endocrinology

## 2017-07-13 ENCOUNTER — Encounter: Payer: Self-pay | Admitting: Endocrinology

## 2017-07-13 VITALS — BP 141/92 | HR 103 | Wt 281.8 lb

## 2017-07-13 DIAGNOSIS — E890 Postprocedural hypothyroidism: Secondary | ICD-10-CM

## 2017-07-13 LAB — TSH: TSH: 0.35 u[IU]/mL (ref 0.35–4.50)

## 2017-07-13 NOTE — Progress Notes (Signed)
Subjective:    Patient ID: Anna Hawkins, female    DOB: 09-09-1976, 41 y.o.   MRN: [id number]  HPI Pt returns for f/u of post-RAI hypothyroidism (hyperthyroidism was dx'ed 2017; she had been noted to have a goiter in 2002; US showed features of both Grave's Dz and multinodular goiter; she has had TL; she had RAI in July, 2017; she started synthroid in late 2017).  pt states she feels well in general, except for ongoing fullness at the neck Past Medical History:  Diagnosis Date  . Asthma   . Chlamydia   . Diabetes mellitus   . Fatty liver   . Graves disease   . History of shoulder dystocia in prior pregnancy   . Obesity   . Pregnancy induced hypertension    diagnosed with pregnancy-started on aldomet-delivered 08/28/11    Past Surgical History:  Procedure Laterality Date  . LAPAROSCOPIC TUBAL LIGATION  10/29/2011   Procedure: LAPAROSCOPIC TUBAL LIGATION;  Surgeon: Mora Bellman, MD;  Location: [facility];  Service: Gynecology;  Laterality: Bilateral;  . THERAPEUTIC ABORTION    . WISDOM TOOTH EXTRACTION      Social History   Social History  . Marital status: Single    Spouse name: N/A  . Number of children: N/A  . Years of education: N/A   Occupational History  . Not on file.   Social History Main Topics  . Smoking status: Never Smoker  . Smokeless tobacco: Never Used  . Alcohol use No  . Drug use: No  . Sexual activity: Yes    Birth control/ protection: Surgical   Other Topics Concern  . Not on file   Social History Narrative  . No narrative on file    Current Outpatient Prescriptions on File Prior to Visit  Medication Sig Dispense Refill  . albuterol (PROVENTIL HFA;VENTOLIN HFA) 108 (90 BASE) MCG/ACT inhaler Inhale 1-2 puffs into the lungs every 4 (four) hours as needed for wheezing or shortness of breath. 18 g 4  . AMITIZA 24 MCG capsule     . blood glucose meter kit and supplies KIT Dispense based on patient and insurance preference. Use up to four times  daily as directed. (FOR ICD-9 250.00, 250.01). 1 each 0  . fexofenadine (ALLEGRA ALLERGY) 180 MG tablet Take 1 tablet (180 mg total) by mouth daily. (Patient taking differently: Take 180 mg by mouth daily as needed for allergies. ) 30 tablet 3  . fluticasone (FLONASE) 50 MCG/ACT nasal spray Place 2 sprays into both nostrils daily. (Patient taking differently: Place 2 sprays into both nostrils daily as needed for allergies. ) 16 g 6  . glucose blood test strip Use as instructed 100 each 12  . Lancet Device MISC 1 Device by Does not apply route daily. 90 each 3  . Lancets (ACCU-CHEK MULTICLIX) lancets Use as instructed 100 each 12  . levothyroxine (SYNTHROID, LEVOTHROID) 112 MCG tablet Take 1 tablet (112 mcg total) by mouth daily before breakfast. 30 tablet 5  . losartan-hydrochlorothiazide (HYZAAR) 100-25 MG tablet TAKE 1 TABLET BY MOUTH DAILY. 90 tablet 3  . phentermine (ADIPEX-P) 37.5 MG tablet Take 37.5 mg by mouth daily.  0  . TRULICITY 7.47 FT/9.5ZX SOPN INJECT 0.75 MG INTO THE SKIN ONCE A WEEK. 2 pen 4   No current facility-administered medications on file prior to visit.     Allergies  Allergen Reactions  . Lisinopril Rash and Cough    Family History  Problem Relation Age of Onset  .  Diabetes Mother   . Hypertension Mother   . Asthma Mother   . Thyroid disease Mother   . Diabetes Brother   . Hypertension Brother   . Asthma Brother   . Diabetes Father   . Cancer Father   . Asthma Father   . Hypertension Father     BP (!) 141/92   Pulse (!) 103   Wt 281 lb 12.8 oz (127.8 kg)   SpO2 97%   BMI 45.48 kg/m   Review of Systems Denies leg edema    Objective:   Physical Exam VITAL SIGNS:  See vs page GENERAL: no distress NECK: There is no palpable thyroid enlargement.  No thyroid nodule is palpable.  No palpable lymphadenopathy at the anterior neck.   Lab Results  Component Value Date   TSH 0.35 07/13/2017      Assessment & Plan:  Goiter: due for recheck Post-RAI  hypothyroidism: well-replaced.  Please continue the same medication  Patient Instructions  blood tests are requested for you today.  We'll let you know about the results.  Let's recheck the ultrasound.  you will receive a phone call, about a day and time for an appointment.  Please come back for a follow-up appointment in 1 year.

## 2017-07-13 NOTE — Patient Instructions (Signed)
blood tests are requested for you today.  We'll let you know about the results. Let's recheck the ultrasound.  you will receive a phone call, about a day and time for an appointment. Please come back for a follow-up appointment in 1 year.   

## 2017-07-15 ENCOUNTER — Encounter: Payer: Self-pay | Admitting: Family Medicine

## 2017-07-15 ENCOUNTER — Ambulatory Visit (INDEPENDENT_AMBULATORY_CARE_PROVIDER_SITE_OTHER): Payer: 59 | Admitting: Family Medicine

## 2017-07-15 VITALS — BP 114/72 | HR 89 | Temp 97.9°F | Ht 66.0 in | Wt 285.8 lb

## 2017-07-15 DIAGNOSIS — Z9109 Other allergy status, other than to drugs and biological substances: Secondary | ICD-10-CM | POA: Diagnosis not present

## 2017-07-15 DIAGNOSIS — E119 Type 2 diabetes mellitus without complications: Secondary | ICD-10-CM

## 2017-07-15 LAB — POCT GLYCOSYLATED HEMOGLOBIN (HGB A1C): HEMOGLOBIN A1C: 7.9

## 2017-07-15 MED ORDER — FLUTICASONE PROPIONATE 50 MCG/ACT NA SUSP: 2.0000 | Freq: Every day | NASAL | 3 refills | Status: DC | PRN

## 2017-07-15 MED ORDER — ALBUTEROL SULFATE HFA 108 (90 BASE) MCG/ACT IN AERS: 1.0000 | INHALATION_SPRAY | RESPIRATORY_TRACT | 4 refills | Status: DC | PRN

## 2017-07-15 MED ORDER — CETIRIZINE HCL 10 MG PO TABS: 10.0000 mg | ORAL_TABLET | Freq: Every day | ORAL | 11 refills | Status: DC

## 2017-07-15 NOTE — Assessment & Plan Note (Signed)
Will prescribe Zyrtec and refill her Flonase prescription.

## 2017-07-15 NOTE — Assessment & Plan Note (Signed)
Will continue with her current dose of Trulicity because her H8N has decreased from 8.1 to 7.9, and she is currently in a weight management program that is changing her diet and will hopefully help her continue to lose weight and lower her A1C.  Will not represcribe metformin because she says she has tried different doses in the past, and each time, it has given her diarrhea.

## 2017-07-15 NOTE — Progress Notes (Signed)
   Subjective:    Anna Hawkins - 41 y.o. female MRN [id number]  Date of birth: 1976-08-22  HPI  EVANGELINA Hawkins is here for an A1C check.  Diabetes Mellitus Type II, uncontrolled: Patient is currently taking Trulicity and is tolerating it well.  She says that she checks her fasting blood sugars each morning, and they range from 170-210 usually, which she knows is a little high.  She says that she gets yearly foot and eye exams.    Morbid Obesity: Patient is currently on a weight loss program in order to qualify for bariatric surgery.  She has been eating a lot of vegetables lately and is proud that she has lost about 20 lbs so far.  She says she needs to lose 30 more lbs then will progress to the second stage of her diet program.    Chronic post-nasal drip: Patient complains of a post-nasal drip that causes a dry cough and is chronic for her.  She denies any history of head trauma.  She has tried Flonase in the past, which does help a little, however Claritin and Allegra are not helpful.     Health Maintenance:  Health Maintenance Due  Topic Date Due  . PNEUMOCOCCAL POLYSACCHARIDE VACCINE (1) 08/03/1977  . FOOT EXAM  08/03/1985  . TETANUS/TDAP  10/18/2014  . OPHTHALMOLOGY EXAM  10/12/2015  . HEMOGLOBIN A1C  04/12/2017    -  reports that she has never smoked. She has never used smokeless tobacco. - Review of Systems: Per HPI. - Past Medical History: Patient Active Problem List   Diagnosis Date Noted  . Environmental allergies 07/15/2017  . Hypothyroidism 01/19/2017  . Type 2 diabetes mellitus without complication ([place]) 82/50/5397  . Physical exam, annual 11/03/2014  . Essential hypertension 10/29/2014  . Knee pain 10/29/2014  . Diarrhea 10/29/2014  . Morbid obesity ([place]) 10/19/2011   - Medications: reviewed and updated   Objective:   Physical Exam BP 114/72   Pulse 89   Temp 97.9 F (36.6 C) (Oral)   Ht 5\' 6"  (1.676 m)   Wt 285 lb 12.8 oz (129.6 kg)   LMP  06/12/2017 (Exact Date)   SpO2 99%   BMI 46.13 kg/m  Gen: NAD, alert, cooperative with exam, well-appearing HEENT: oropharynx nonerythematous, no rhinorrhea CV: RRR, good S1/S2, no murmur, no edema, capillary refill brisk  Resp: CTABL, no wheezes, non-labored Skin: no rashes, normal turgor  Neuro: no gross deficits.  Psych: good insight, alert and oriented        Assessment & Plan:   Type 2 diabetes mellitus without complication ([place]) Will continue with her current dose of Trulicity because her Q7H has decreased from 8.1 to 7.9, and she is currently in a weight management program that is changing her diet and will hopefully help her continue to lose weight and lower her A1C.  Will not represcribe metformin because she says she has tried different doses in the past, and each time, it has given her diarrhea.  Environmental allergies Will prescribe Zyrtec and refill her Flonase prescription.  Morbid obesity ([place]) Patient was congratulated on 20 lb weight loss and encouraged to continue consuming vegetables and following with her health coach.  She was told that her new plant-based diet and weight loss is likely contributing to her lower A1C.    Anna Hawkins, M.D. 07/15/2017, 5:20 PM PGY-1, [place]

## 2017-07-15 NOTE — Assessment & Plan Note (Signed)
Patient was congratulated on 20 lb weight loss and encouraged to continue consuming vegetables and following with her health coach.  She was told that her new plant-based diet and weight loss is likely contributing to her lower A1C.

## 2017-07-15 NOTE — Patient Instructions (Signed)
It was a pleasure to meet you today!  Great work on your weight loss and congrats on your A1C improvement to 7.9.  Please continue taking your Trulicity as you have been and checking your sugars each morning.   We also refilled your albuterol inhaler and prescribed zyrtec for your allergies today.  Please only take your albuterol as you need it, and take zyrtec once a day.  Please return in 3 months for an A1C check.

## 2017-07-16 ENCOUNTER — Other Ambulatory Visit: Payer: Self-pay | Admitting: Endocrinology

## 2017-07-29 ENCOUNTER — Other Ambulatory Visit: Payer: Self-pay | Admitting: *Deleted

## 2017-07-29 DIAGNOSIS — E119 Type 2 diabetes mellitus without complications: Secondary | ICD-10-CM

## 2017-07-29 MED ORDER — DULAGLUTIDE 0.75 MG/0.5ML ~~LOC~~ SOAJ: 0.7500 mg | SUBCUTANEOUS | 4 refills | Status: DC

## 2017-08-27 ENCOUNTER — Other Ambulatory Visit: Payer: Self-pay

## 2017-08-27 ENCOUNTER — Telehealth: Payer: Self-pay | Admitting: Endocrinology

## 2017-08-27 MED ORDER — LEVOTHYROXINE SODIUM 112 MCG PO TABS: ORAL_TABLET | ORAL | 5 refills | Status: DC

## 2017-08-27 NOTE — Telephone Encounter (Signed)
Patient called in reference to pharmacy not having levothyroxine (SYNTHROID, LEVOTHROID) 112 MCG tablet. Patient stated she would like to try the [facility] at [address], [place], [place] [address] phone number [phone]. Please call pharmacy and advise. Patient would like a phone call when sent to pharmacy .

## 2017-08-27 NOTE — Telephone Encounter (Signed)
Routing to you °

## 2017-08-27 NOTE — Telephone Encounter (Signed)
Called patient & left VM that I sent prescription into [facility] to see if that had medication in stock.

## 2017-12-23 ENCOUNTER — Other Ambulatory Visit: Payer: Self-pay | Admitting: Family Medicine

## 2017-12-23 DIAGNOSIS — E119 Type 2 diabetes mellitus without complications: Secondary | ICD-10-CM

## 2018-01-31 DIAGNOSIS — E0500 Thyrotoxicosis with diffuse goiter without thyrotoxic crisis or storm: Secondary | ICD-10-CM | POA: Diagnosis not present

## 2018-01-31 DIAGNOSIS — I10 Essential (primary) hypertension: Secondary | ICD-10-CM | POA: Diagnosis not present

## 2018-01-31 DIAGNOSIS — K219 Gastro-esophageal reflux disease without esophagitis: Secondary | ICD-10-CM | POA: Diagnosis not present

## 2018-02-09 DIAGNOSIS — Z6841 Body Mass Index (BMI) 40.0 and over, adult: Secondary | ICD-10-CM | POA: Diagnosis not present

## 2018-02-09 DIAGNOSIS — Z713 Dietary counseling and surveillance: Secondary | ICD-10-CM | POA: Diagnosis not present

## 2018-02-12 DIAGNOSIS — M26609 Unspecified temporomandibular joint disorder, unspecified side: Secondary | ICD-10-CM | POA: Diagnosis not present

## 2018-02-15 DIAGNOSIS — I10 Essential (primary) hypertension: Secondary | ICD-10-CM | POA: Diagnosis not present

## 2018-02-15 DIAGNOSIS — Z6841 Body Mass Index (BMI) 40.0 and over, adult: Secondary | ICD-10-CM | POA: Diagnosis not present

## 2018-02-27 ENCOUNTER — Other Ambulatory Visit: Payer: Self-pay | Admitting: Endocrinology

## 2018-03-03 DIAGNOSIS — Z6841 Body Mass Index (BMI) 40.0 and over, adult: Secondary | ICD-10-CM | POA: Diagnosis not present

## 2018-03-03 DIAGNOSIS — Z713 Dietary counseling and surveillance: Secondary | ICD-10-CM | POA: Diagnosis not present

## 2018-03-14 DIAGNOSIS — Z6841 Body Mass Index (BMI) 40.0 and over, adult: Secondary | ICD-10-CM | POA: Diagnosis not present

## 2018-03-14 DIAGNOSIS — I10 Essential (primary) hypertension: Secondary | ICD-10-CM | POA: Diagnosis not present

## 2018-03-25 ENCOUNTER — Other Ambulatory Visit: Payer: Self-pay | Admitting: Endocrinology

## 2018-04-15 DIAGNOSIS — Z713 Dietary counseling and surveillance: Secondary | ICD-10-CM | POA: Diagnosis not present

## 2018-04-15 DIAGNOSIS — I10 Essential (primary) hypertension: Secondary | ICD-10-CM | POA: Diagnosis not present

## 2018-04-24 ENCOUNTER — Other Ambulatory Visit: Payer: Self-pay | Admitting: Endocrinology

## 2018-05-06 DIAGNOSIS — Z713 Dietary counseling and surveillance: Secondary | ICD-10-CM | POA: Diagnosis not present

## 2018-05-06 DIAGNOSIS — I10 Essential (primary) hypertension: Secondary | ICD-10-CM | POA: Diagnosis not present

## 2018-05-29 ENCOUNTER — Other Ambulatory Visit: Payer: Self-pay | Admitting: Endocrinology

## 2018-06-08 DIAGNOSIS — I10 Essential (primary) hypertension: Secondary | ICD-10-CM | POA: Diagnosis not present

## 2018-06-08 DIAGNOSIS — E119 Type 2 diabetes mellitus without complications: Secondary | ICD-10-CM | POA: Diagnosis not present

## 2018-06-27 ENCOUNTER — Other Ambulatory Visit: Payer: Self-pay | Admitting: Endocrinology

## 2018-07-12 ENCOUNTER — Encounter: Payer: Self-pay | Admitting: Family Medicine

## 2018-07-12 ENCOUNTER — Other Ambulatory Visit: Payer: Self-pay

## 2018-07-12 ENCOUNTER — Ambulatory Visit (INDEPENDENT_AMBULATORY_CARE_PROVIDER_SITE_OTHER): Payer: 59 | Admitting: Family Medicine

## 2018-07-12 VITALS — BP 118/70 | HR 95 | Temp 98.1°F | Ht 66.0 in | Wt 303.8 lb

## 2018-07-12 DIAGNOSIS — Z0000 Encounter for general adult medical examination without abnormal findings: Secondary | ICD-10-CM | POA: Diagnosis not present

## 2018-07-12 DIAGNOSIS — E032 Hypothyroidism due to medicaments and other exogenous substances: Secondary | ICD-10-CM

## 2018-07-12 DIAGNOSIS — E119 Type 2 diabetes mellitus without complications: Secondary | ICD-10-CM

## 2018-07-12 DIAGNOSIS — Z23 Encounter for immunization: Secondary | ICD-10-CM

## 2018-07-12 DIAGNOSIS — D500 Iron deficiency anemia secondary to blood loss (chronic): Secondary | ICD-10-CM | POA: Diagnosis not present

## 2018-07-12 DIAGNOSIS — J309 Allergic rhinitis, unspecified: Secondary | ICD-10-CM | POA: Diagnosis not present

## 2018-07-12 LAB — POCT GLYCOSYLATED HEMOGLOBIN (HGB A1C): HbA1c, POC (controlled diabetic range): 7.9 % — AB (ref 0.0–7.0)

## 2018-07-12 MED ORDER — FEXOFENADINE HCL 180 MG PO TABS: 180.0000 mg | ORAL_TABLET | Freq: Every day | ORAL | 3 refills | Status: DC

## 2018-07-12 NOTE — Assessment & Plan Note (Addendum)
Patient to get TSH checked at weight management clinic.

## 2018-07-12 NOTE — Patient Instructions (Addendum)
It was nice seeing you today Ms. Bernardy!  We are checking your iron level and blood count today as well as the level of protein in your urine.  I will call you if your results are abnormal.  Please continue your current medications and try Allegra for your allergies.  Good luck on your upcoming bariatric surgery!  If you have any questions or concerns, please feel free to call the clinic.   Be well,  Dr. Shan Levans

## 2018-07-12 NOTE — Progress Notes (Addendum)
   Subjective:    SEQUOIA WITZ - 42 y.o. female MRN [id number]  Date of birth: 1976/02/19  HPI  TATUMN CORBRIDGE is here for a physical and to check her Hgb A1c.  She is preparing for bariatric surgery and hopes that it will be done in September.    T2DM She continues to take Trulicity each week and is on phentermine and topomax for weight management.  She cannot tolerate metformin.  Anemia She would like for her iron to be checked, since she has a history of anemia.  She is taking iron supplementation for this.  She endorses heavy periods.  Hypothyroidism She is scheduled to have her TSH checked at the clinic that is preparing her for bariatric surgery.    Seasonal Allergies She continues to have chronic postnasal drip that is not improved with Flonase and Zyrtec.  Otherwise, she has no concerns.   Health Maintenance:  She would like to get the pneumonia and Tdap vaccines today. Health Maintenance Due  Topic Date Due  . OPHTHALMOLOGY EXAM  10/12/2015    -  reports that she has never smoked. She has never used smokeless tobacco. - Review of Systems: Per HPI. - Past Medical History: Patient Active Problem List   Diagnosis Date Noted  . Multinodular goiter 07/13/2018  . Iron deficiency anemia due to chronic blood loss 07/13/2018  . Environmental allergies 07/15/2017  . Hypothyroidism 01/19/2017  . Allergic rhinitis 02/03/2016  . Type 2 diabetes mellitus without complication ([place]) 50/56/9794  . Physical exam, annual 11/03/2014  . Essential hypertension 10/29/2014  . Knee pain 10/29/2014  . Diarrhea 10/29/2014  . Morbid obesity ([place]) 10/19/2011   - Medications: reviewed and updated   Objective:   Physical Exam BP 118/70   Pulse 95   Temp 98.1 F (36.7 C) (Oral)   Ht 5\' 6"  (1.676 m)   Wt (!) 303 lb 12.8 oz (137.8 kg)   LMP 07/08/2018 (Exact Date)   SpO2 97%   BMI 49.03 kg/m  Gen: NAD, alert, cooperative with exam, well-appearing, morbidly obese HEENT: NCAT,  PERRL, clear conjunctiva, oropharynx clear, supple neck, no goiter CV: RRR, good S1/S2, no murmur, no edema Resp: CTABL, no wheezes, non-labored Abd: SNTND, BS present, no guarding or organomegaly Skin: no rashes, normal turgor  Neuro: no gross deficits.  Psych: good insight, alert and oriented  Diabetic Foot Exam - Simple   Simple Foot Form Visual Inspection No deformities, no ulcerations, no other skin breakdown bilaterally:  Yes Sensation Testing Intact to touch and monofilament testing bilaterally:  Yes Pulse Check Posterior Tibialis and Dorsalis pulse intact bilaterally:  Yes Comments        Assessment & Plan:   Type 2 diabetes mellitus without complication (HCC) I0X is 7.9 today.  Will not make any changes to patient's Trulicity because her treatment needs will likely change after her bariatric surgery.  Will obtain urine microalbumin.  Hypothyroidism Patient to get TSH checked at weight management clinic.  Allergic rhinitis Will prescribe Allegra, advised patient to continue Flonase.  Iron deficiency anemia due to chronic blood loss Will obtain CBC, Fe panel today.  Advised patient to continue taking Fe supplementation.  Patient may benefit from further intervention to alleviate her menorrhagia in the future.    Maia Breslow, M.D. 07/13/2018, 9:34 AM PGY-2, [place]

## 2018-07-12 NOTE — Assessment & Plan Note (Addendum)
A1c is 7.9 today.  Will not make any changes to patient's Trulicity because her treatment needs will likely change after her bariatric surgery.  Will obtain urine microalbumin.

## 2018-07-13 ENCOUNTER — Encounter: Payer: Self-pay | Admitting: Endocrinology

## 2018-07-13 ENCOUNTER — Ambulatory Visit (INDEPENDENT_AMBULATORY_CARE_PROVIDER_SITE_OTHER): Payer: 59 | Admitting: Endocrinology

## 2018-07-13 VITALS — BP 124/92 | HR 74 | Ht 66.0 in | Wt 300.2 lb

## 2018-07-13 DIAGNOSIS — E042 Nontoxic multinodular goiter: Secondary | ICD-10-CM | POA: Diagnosis not present

## 2018-07-13 DIAGNOSIS — E038 Other specified hypothyroidism: Secondary | ICD-10-CM

## 2018-07-13 DIAGNOSIS — E063 Autoimmune thyroiditis: Secondary | ICD-10-CM

## 2018-07-13 DIAGNOSIS — D500 Iron deficiency anemia secondary to blood loss (chronic): Secondary | ICD-10-CM | POA: Insufficient documentation

## 2018-07-13 LAB — CBC
HEMATOCRIT: 33.9 % — AB (ref 34.0–46.6)
Hemoglobin: 10.7 g/dL — ABNORMAL LOW (ref 11.1–15.9)
MCH: 22.4 pg — ABNORMAL LOW (ref 26.6–33.0)
MCHC: 31.6 g/dL (ref 31.5–35.7)
MCV: 71 fL — ABNORMAL LOW (ref 79–97)
PLATELETS: 369 10*3/uL (ref 150–450)
RBC: 4.78 x10E6/uL (ref 3.77–5.28)
RDW: 19.3 % — AB (ref 12.3–15.4)
WBC: 10.8 10*3/uL (ref 3.4–10.8)

## 2018-07-13 LAB — IRON,TIBC AND FERRITIN PANEL
Ferritin: 22 ng/mL (ref 15–150)
Iron Saturation: 16 % (ref 15–55)
Iron: 66 ug/dL (ref 27–159)
Total Iron Binding Capacity: 410 ug/dL (ref 250–450)
UIBC: 344 ug/dL (ref 131–425)

## 2018-07-13 LAB — MICROALBUMIN / CREATININE URINE RATIO
Creatinine, Urine: 195.7 mg/dL
MICROALB/CREAT RATIO: 7.3 mg/g{creat} (ref 0.0–30.0)
Microalbumin, Urine: 14.3 ug/mL

## 2018-07-13 LAB — TSH: TSH: 1.44 u[IU]/mL (ref 0.35–4.50)

## 2018-07-13 LAB — T4, FREE: Free T4: 1.11 ng/dL (ref 0.60–1.60)

## 2018-07-13 NOTE — Progress Notes (Signed)
Subjective:    Patient ID: Anna Hawkins, female    DOB: 04/23/1976, 42 y.o.   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  pt states she feels well in general.  Specifically, she says fullness at the neck is less recently.   Past Medical History:  Diagnosis Date  . Asthma   . Chlamydia   . Diabetes mellitus   . Fatty liver   . Graves disease   . History of shoulder dystocia in prior pregnancy   . Obesity     Past Surgical History:  Procedure Laterality Date  . LAPAROSCOPIC TUBAL LIGATION  10/29/2011   Procedure: LAPAROSCOPIC TUBAL LIGATION;  Surgeon: Mora Bellman, MD;  Location: [facility];  Service: Gynecology;  Laterality: Bilateral;  . THERAPEUTIC ABORTION    . WISDOM TOOTH EXTRACTION      Social History   Socioeconomic History  . Marital status: Single    Spouse name: Not on file  . Number of children: Not on file  . Years of education: Not on file  . Highest education level: Not on file  Occupational History  . Not on file  Social Needs  . Financial resource strain: Not on file  . Food insecurity:    Worry: Not on file    Inability: Not on file  . Transportation needs:    Medical: Not on file    Non-medical: Not on file  Tobacco Use  . Smoking status: Never Smoker  . Smokeless tobacco: Never Used  Substance and Sexual Activity  . Alcohol use: No  . Drug use: No  . Sexual activity: Yes    Birth control/protection: Surgical  Lifestyle  . Physical activity:    Days per week: Not on file    Minutes per session: Not on file  . Stress: Not on file  Relationships  . Social connections:    Talks on phone: Not on file    Gets together: Not on file    Attends religious service: Not on file    Active member of club or  organization: Not on file    Attends meetings of clubs or organizations: Not on file    Relationship status: Not on file  . Intimate partner violence:    Fear of current or ex partner: Not on file    Emotionally abused: Not on file    Physically abused: Not on file    Forced sexual activity: Not on file  Other Topics Concern  . Not on file  Social History Narrative  . Not on file    Current Outpatient Medications on File Prior to Visit  Medication Sig Dispense Refill  . albuterol (PROVENTIL HFA;VENTOLIN HFA) 108 (90 Base) MCG/ACT inhaler Inhale 1-2 puffs into the lungs every 4 (four) hours as needed for wheezing or shortness of breath. 18 g 4  . AMITIZA 24 MCG capsule     . blood glucose meter kit and supplies KIT Dispense based on patient and insurance preference. Use up to four times daily as directed. (FOR ICD-9 250.00, 250.01). 1 each 0  . fexofenadine (ALLEGRA ALLERGY) 180 MG tablet Take 1 tablet (180 mg total) by mouth daily. 30 tablet 3  . fluticasone (FLONASE) 50 MCG/ACT nasal spray Place  2 sprays into both nostrils daily as needed for allergies. 16 g 3  . glucose blood test strip Use as instructed 100 each 12  . Lancet Device MISC 1 Device by Does not apply route daily. 90 each 3  . Lancets (ACCU-CHEK MULTICLIX) lancets Use as instructed 100 each 12  . levothyroxine (SYNTHROID, LEVOTHROID) 112 MCG tablet TAKE 1 TABLET BY MOUTH EVERY DAY BEFORE BREAKFAST 30 tablet 0  . losartan-hydrochlorothiazide (HYZAAR) 100-25 MG tablet TAKE 1 TABLET BY MOUTH DAILY. 90 tablet 3  . phentermine (ADIPEX-P) 37.5 MG tablet Take 37.5 mg by mouth daily.  0  . TRULICITY 1.75 ZW/2.5EN SOPN INJECT 0.75 MG INTO THE SKIN ONCE A WEEK. 2 pen 3   No current facility-administered medications on file prior to visit.     Allergies  Allergen Reactions  . Lisinopril Rash and Cough    Family History  Problem Relation Age of Onset  . Diabetes Mother   . Hypertension Mother   . Asthma Mother   . Thyroid  disease Mother   . Diabetes Brother   . Hypertension Brother   . Asthma Brother   . Diabetes Father   . Cancer Father   . Asthma Father   . Hypertension Father     BP (!) 124/92 (BP Location: Right Arm, Patient Position: Sitting, Cuff Size: Large)   Pulse 74   Ht '5\' 6"'  (1.676 m)   Wt (!) 300 lb 3.2 oz (136.2 kg)   LMP 07/08/2018 (Exact Date)   SpO2 97%   BMI 48.45 kg/m    Review of Systems Denies neck pain    Objective:   Physical Exam VITAL SIGNS:  See vs page GENERAL: no distress NECK: There is no palpable thyroid enlargement.  No thyroid nodule is palpable.  No palpable lymphadenopathy at the anterior neck.      Assessment & Plan:  HTN: is noted today Multinodular goiter: due for recheck Post-RAI hypothyroidism: recheck today  Patient Instructions  Your blood pressure is high today.  Please see your primary care provider soon, to have it rechecked Thyroid blood tests are requested for you today.  We'll let you know about the results.  Let's recheck the ultrasound.  you will receive a phone call, about a day and time for an appointment.  Please come back for a follow-up appointment in 1 year.

## 2018-07-13 NOTE — Assessment & Plan Note (Signed)
Will prescribe Allegra, advised patient to continue Flonase.

## 2018-07-13 NOTE — Patient Instructions (Addendum)
Your blood pressure is high today.  Please see your primary care provider soon, to have it rechecked Thyroid blood tests are requested for you today.  We'll let you know about the results.  Let's recheck the ultrasound.  you will receive a phone call, about a day and time for an appointment.  Please come back for a follow-up appointment in 1 year.

## 2018-07-13 NOTE — Assessment & Plan Note (Signed)
Will obtain CBC, Fe panel today.  Advised patient to continue taking Fe supplementation.  Patient may benefit from further intervention to alleviate her menorrhagia in the future.

## 2018-07-14 ENCOUNTER — Encounter: Payer: Self-pay | Admitting: Family Medicine

## 2018-07-14 DIAGNOSIS — E119 Type 2 diabetes mellitus without complications: Secondary | ICD-10-CM | POA: Diagnosis not present

## 2018-07-14 DIAGNOSIS — Z6841 Body Mass Index (BMI) 40.0 and over, adult: Secondary | ICD-10-CM | POA: Diagnosis not present

## 2018-07-18 ENCOUNTER — Encounter: Payer: Self-pay | Admitting: Family Medicine

## 2018-07-19 ENCOUNTER — Other Ambulatory Visit: Payer: Self-pay | Admitting: Family Medicine

## 2018-07-19 MED ORDER — FERROUS SULFATE 325 (65 FE) MG PO TABS: 975.0000 mg | ORAL_TABLET | ORAL | 3 refills | Status: DC

## 2018-07-22 ENCOUNTER — Other Ambulatory Visit: Payer: 59

## 2018-07-22 ENCOUNTER — Ambulatory Visit
Admission: RE | Admit: 2018-07-22 | Discharge: 2018-07-22 | Disposition: A | Discharge status: Home / Self Care | Payer: 59 | Source: Ambulatory Visit | Attending: Endocrinology | Admitting: Endocrinology

## 2018-07-22 DIAGNOSIS — E042 Nontoxic multinodular goiter: Secondary | ICD-10-CM

## 2018-07-22 DIAGNOSIS — E041 Nontoxic single thyroid nodule: Secondary | ICD-10-CM | POA: Diagnosis not present

## 2018-07-28 ENCOUNTER — Other Ambulatory Visit: Payer: Self-pay | Admitting: Endocrinology

## 2018-08-01 ENCOUNTER — Encounter: Payer: Self-pay | Admitting: Family Medicine

## 2018-08-09 ENCOUNTER — Other Ambulatory Visit: Payer: Self-pay | Admitting: Family Medicine

## 2018-08-09 DIAGNOSIS — E119 Type 2 diabetes mellitus without complications: Secondary | ICD-10-CM

## 2018-08-16 DIAGNOSIS — E119 Type 2 diabetes mellitus without complications: Secondary | ICD-10-CM | POA: Diagnosis not present

## 2018-08-16 DIAGNOSIS — I10 Essential (primary) hypertension: Secondary | ICD-10-CM | POA: Diagnosis not present

## 2018-08-16 DIAGNOSIS — E669 Obesity, unspecified: Secondary | ICD-10-CM | POA: Diagnosis not present

## 2018-08-24 ENCOUNTER — Other Ambulatory Visit: Payer: Self-pay | Admitting: Endocrinology

## 2018-09-01 ENCOUNTER — Encounter: Payer: Self-pay | Admitting: Family Medicine

## 2018-09-02 ENCOUNTER — Other Ambulatory Visit: Payer: Self-pay | Admitting: Family Medicine

## 2018-09-02 MED ORDER — FERROUS SULFATE 325 (65 FE) MG PO TABS: 975.0000 mg | ORAL_TABLET | Freq: Three times a day (TID) | ORAL | 3 refills | Status: DC

## 2018-09-02 NOTE — Progress Notes (Unsigned)
Refilling Iron pills prescription

## 2018-09-13 DIAGNOSIS — Z01818 Encounter for other preprocedural examination: Secondary | ICD-10-CM | POA: Diagnosis not present

## 2018-09-21 DIAGNOSIS — Z6841 Body Mass Index (BMI) 40.0 and over, adult: Secondary | ICD-10-CM | POA: Diagnosis not present

## 2018-09-21 DIAGNOSIS — Z01818 Encounter for other preprocedural examination: Secondary | ICD-10-CM | POA: Diagnosis not present

## 2018-09-25 ENCOUNTER — Other Ambulatory Visit: Payer: Self-pay | Admitting: Family Medicine

## 2018-09-25 ENCOUNTER — Other Ambulatory Visit: Payer: Self-pay | Admitting: Endocrinology

## 2018-09-27 DIAGNOSIS — E119 Type 2 diabetes mellitus without complications: Secondary | ICD-10-CM | POA: Diagnosis not present

## 2018-09-27 DIAGNOSIS — I10 Essential (primary) hypertension: Secondary | ICD-10-CM | POA: Diagnosis not present

## 2018-09-27 DIAGNOSIS — Z6841 Body Mass Index (BMI) 40.0 and over, adult: Secondary | ICD-10-CM | POA: Diagnosis not present

## 2018-10-24 DIAGNOSIS — Z713 Dietary counseling and surveillance: Secondary | ICD-10-CM | POA: Diagnosis not present

## 2018-10-24 DIAGNOSIS — Z6841 Body Mass Index (BMI) 40.0 and over, adult: Secondary | ICD-10-CM | POA: Diagnosis not present

## 2018-10-25 ENCOUNTER — Other Ambulatory Visit: Payer: Self-pay | Admitting: Endocrinology

## 2018-10-31 ENCOUNTER — Telehealth: Payer: Self-pay

## 2018-10-31 NOTE — Telephone Encounter (Signed)
Losartan-HCTZ on backorder. Needs alternative sent. Danley Danker, RN [facility] RN)

## 2018-11-03 ENCOUNTER — Other Ambulatory Visit: Payer: Self-pay | Admitting: Family Medicine

## 2018-11-03 MED ORDER — LOSARTAN POTASSIUM 100 MG PO TABS: 100.0000 mg | ORAL_TABLET | Freq: Every day | ORAL | 0 refills | Status: DC

## 2018-11-03 MED ORDER — HYDROCHLOROTHIAZIDE 25 MG PO TABS: 25.0000 mg | ORAL_TABLET | Freq: Every day | ORAL | 0 refills | Status: DC

## 2018-11-03 NOTE — Telephone Encounter (Signed)
I sent in losartan 100 mg and HCTZ 25 mg to the [facility] on [address] since the combination pill is unavailable.  Please let her know to take these 2 medicines once daily and that these are just the separate forms of the combination pill she was taking before, so her medications have not changed.  Sent in a 36-month supply so that we can switch back to the combination pill once it becomes available.

## 2018-11-03 NOTE — Telephone Encounter (Signed)
Spoke to pt. FYI.Anna KitchenMarland KitchenShe had weight loss surgery October 4th and her Dr at [facility] took her off of HCTZ because it would dehydrate her. She has not been taking it since the surgery. Pt is still taking the losartan but [facility] filled that for her. Ottis Stain, CMA

## 2018-11-21 ENCOUNTER — Other Ambulatory Visit: Payer: Self-pay | Admitting: Endocrinology

## 2018-11-22 ENCOUNTER — Other Ambulatory Visit: Payer: Self-pay | Admitting: Family Medicine

## 2018-11-22 DIAGNOSIS — J309 Allergic rhinitis, unspecified: Secondary | ICD-10-CM

## 2018-12-09 ENCOUNTER — Ambulatory Visit (INDEPENDENT_AMBULATORY_CARE_PROVIDER_SITE_OTHER): Payer: 59 | Admitting: Family Medicine

## 2018-12-09 ENCOUNTER — Other Ambulatory Visit: Payer: Self-pay

## 2018-12-09 ENCOUNTER — Encounter: Payer: Self-pay | Admitting: Family Medicine

## 2018-12-09 VITALS — BP 138/88 | HR 78 | Temp 98.4°F | Ht 66.0 in | Wt 282.4 lb

## 2018-12-09 DIAGNOSIS — Z23 Encounter for immunization: Secondary | ICD-10-CM | POA: Diagnosis not present

## 2018-12-09 DIAGNOSIS — E119 Type 2 diabetes mellitus without complications: Secondary | ICD-10-CM

## 2018-12-09 DIAGNOSIS — M67431 Ganglion, right wrist: Secondary | ICD-10-CM | POA: Insufficient documentation

## 2018-12-09 LAB — POCT GLYCOSYLATED HEMOGLOBIN (HGB A1C): HBA1C, POC (CONTROLLED DIABETIC RANGE): 5.7 % (ref 0.0–7.0)

## 2018-12-09 MED ORDER — ALBUTEROL SULFATE HFA 108 (90 BASE) MCG/ACT IN AERS: 1.0000 | INHALATION_SPRAY | RESPIRATORY_TRACT | 4 refills | Status: DC | PRN

## 2018-12-09 NOTE — Patient Instructions (Signed)
It was nice seeing you today Anna Hawkins!  Congratulations on your weight loss and diabetes control!  You do not need to continue taking Trulicity, and if you continue making progress, I do not think you will need to take diabetes medications in the future.  You have a ganglion cyst on your wrist, which we can get an ultrasound in the future, but it is okay to just watch it.  Please let us know if this cyst starts to bother you.  If you have any questions or concerns, please feel free to call the clinic.   Be well,  Dr. Shan Levans

## 2018-12-09 NOTE — Assessment & Plan Note (Signed)
Physical exam is consistent with a ganglion cyst.  Patient was advised that this can be aspirated, but over half of these can recur after aspiration.  Patient was also told that surgery can remove the cyst, but there are nerves and vessels that run through that area that must be avoided during surgery.  She was also told that she can continue to observe this cyst, since it may go away on its own.  She elects to observe the cyst for now.

## 2018-12-09 NOTE — Assessment & Plan Note (Signed)
Patient's A1c has reduced from 7.9 to 5.7 today.  This is likely due to the weight loss and dietary modification she has made since her gastric sleeve surgery.  Patient was congratulated on her hard work in progress and Trulicity was taken off of her medication list.  We will see patient in 3 months to follow-up on her diabetes and weight loss.  Encouraged patient to see an ophthalmologist in the coming year, since she likely saw an optometrist at [facility]

## 2018-12-09 NOTE — Progress Notes (Signed)
Subjective:    Anna Hawkins - 42 y.o. female MRN [id number]  Date of birth: [birth year]  CC:  Anna Hawkins is here for medication review and follow up of diabetes.  She would also like to discuss a knot on her wrist  HPI:  Medication review/diabetes f/u - got the gastric sleeve surgery on October 08 and has lost 17 lbs since then - eating about 600 calories per day currently, eating protein shakes and chicken and is eating only small portions - has stopped eating sugary foods and drinking soft drinks - stopped Trulicity about one month ago because her morning sugars have been 80s to 90s  Knot on her wrist -She first noticed a knot on the ventral side of her right wrist around the time of her gastric sleeve surgery in October -Is nonpainful and does not fluctuate in size -Was told by her family to get it checked out since it was close to the veins in her wrist  Health Maintenance:  - got an eye exam at [facility] in September  Health Maintenance Due  Topic Date Due  . OPHTHALMOLOGY EXAM  10/12/2015  . INFLUENZA VACCINE  07/21/2018    -  reports that she has never smoked. She has never used smokeless tobacco. - Review of Systems: Per HPI. - Past Medical History: Patient Active Problem List   Diagnosis Date Noted  . Ganglion cyst of volar aspect of right wrist 12/09/2018  . Multinodular goiter 07/13/2018  . Iron deficiency anemia due to chronic blood loss 07/13/2018  . Environmental allergies 07/15/2017  . Hypothyroidism 01/19/2017  . Allergic rhinitis 02/03/2016  . Type 2 diabetes mellitus without complication ([place]) 62/26/3335  . Physical exam, annual 11/03/2014  . Essential hypertension 10/29/2014  . Knee pain 10/29/2014  . Diarrhea 10/29/2014  . Morbid obesity ([place]) 10/19/2011   - Medications: reviewed and updated   Objective:   Physical Exam BP 138/88   Pulse 78   Temp 98.4 F (36.9 C) (Oral)   Ht 5\' 6"  (1.676 m)   Wt 282 lb 6.4 oz (128.1 kg)   LMP  11/29/2018 (Exact Date)   SpO2 100%   BMI 45.58 kg/m  Gen: NAD, alert, cooperative with exam, well-appearing CV: RRR, good S1/S2, no murmur Resp: CTABL, no wheezes, non-labored Abd: SNTND, BS present, no guarding or organomegaly Psych: good insight, alert and oriented Musculoskeletal: Soft, mobile nodule about 1.5 cm in diameter on patient's ventral right wrist.  Nontender to palpation.        Assessment & Plan:   Type 2 diabetes mellitus without complication (HCC) Patient's A1c has reduced from 7.9 to 5.7 today.  This is likely due to the weight loss and dietary modification she has made since her gastric sleeve surgery.  Patient was congratulated on her hard work in progress and Trulicity was taken off of her medication list.  We will see patient in 3 months to follow-up on her diabetes and weight loss.  Encouraged patient to see an ophthalmologist in the coming year, since she likely saw an optometrist at [facility].  Ganglion cyst of volar aspect of right wrist Physical exam is consistent with a ganglion cyst.  Patient was advised that this can be aspirated, but over half of these can recur after aspiration.  Patient was also told that surgery can remove the cyst, but there are nerves and vessels that run through that area that must be avoided during surgery.  She was also told that she can  continue to observe this cyst, since it may go away on its own.  She elects to observe the cyst for now.    Maia Breslow, M.D. 12/09/2018, 1:04 PM PGY-2, [place]

## 2018-12-22 ENCOUNTER — Other Ambulatory Visit: Payer: Self-pay | Admitting: Family Medicine

## 2019-01-11 DIAGNOSIS — E119 Type 2 diabetes mellitus without complications: Secondary | ICD-10-CM | POA: Diagnosis not present

## 2019-01-11 DIAGNOSIS — Z9884 Bariatric surgery status: Secondary | ICD-10-CM | POA: Diagnosis not present

## 2019-01-11 DIAGNOSIS — R55 Syncope and collapse: Secondary | ICD-10-CM | POA: Diagnosis not present

## 2019-01-11 DIAGNOSIS — I10 Essential (primary) hypertension: Secondary | ICD-10-CM | POA: Diagnosis not present

## 2019-01-19 ENCOUNTER — Other Ambulatory Visit: Payer: Self-pay | Admitting: Family Medicine

## 2019-01-25 DIAGNOSIS — J111 Influenza due to unidentified influenza virus with other respiratory manifestations: Secondary | ICD-10-CM | POA: Diagnosis not present

## 2019-01-25 DIAGNOSIS — R05 Cough: Secondary | ICD-10-CM | POA: Diagnosis not present

## 2019-01-25 DIAGNOSIS — J029 Acute pharyngitis, unspecified: Secondary | ICD-10-CM | POA: Diagnosis not present

## 2019-01-26 ENCOUNTER — Other Ambulatory Visit: Payer: Self-pay | Admitting: Family Medicine

## 2019-02-06 DIAGNOSIS — Z9884 Bariatric surgery status: Secondary | ICD-10-CM | POA: Diagnosis not present

## 2019-02-06 DIAGNOSIS — Z6841 Body Mass Index (BMI) 40.0 and over, adult: Secondary | ICD-10-CM | POA: Diagnosis not present

## 2019-02-06 DIAGNOSIS — E569 Vitamin deficiency, unspecified: Secondary | ICD-10-CM | POA: Diagnosis not present

## 2019-02-06 DIAGNOSIS — Z713 Dietary counseling and surveillance: Secondary | ICD-10-CM | POA: Diagnosis not present

## 2019-03-15 ENCOUNTER — Encounter: Payer: Self-pay | Admitting: Family Medicine

## 2019-03-21 ENCOUNTER — Encounter: Payer: Self-pay | Admitting: Family Medicine

## 2019-03-29 ENCOUNTER — Telehealth: Payer: Self-pay | Admitting: Family Medicine

## 2019-03-29 NOTE — Telephone Encounter (Signed)
FMLA form filled out and placed in fax pile.

## 2019-04-14 ENCOUNTER — Encounter: Payer: Self-pay | Admitting: Family Medicine

## 2019-07-11 ENCOUNTER — Other Ambulatory Visit: Payer: Self-pay | Admitting: Endocrinology

## 2019-07-12 ENCOUNTER — Other Ambulatory Visit: Payer: Self-pay

## 2019-07-14 ENCOUNTER — Encounter: Payer: Self-pay | Admitting: Endocrinology

## 2019-07-14 ENCOUNTER — Other Ambulatory Visit: Payer: Self-pay

## 2019-07-14 ENCOUNTER — Ambulatory Visit (INDEPENDENT_AMBULATORY_CARE_PROVIDER_SITE_OTHER): Payer: 59 | Admitting: Endocrinology

## 2019-07-14 VITALS — BP 118/72 | HR 90 | Ht 66.0 in | Wt 265.0 lb

## 2019-07-14 DIAGNOSIS — E890 Postprocedural hypothyroidism: Secondary | ICD-10-CM

## 2019-07-14 LAB — TSH: TSH: 0.64 u[IU]/mL (ref 0.35–4.50)

## 2019-07-14 LAB — T4, FREE: Free T4: 1.27 ng/dL (ref 0.60–1.60)

## 2019-07-14 NOTE — Patient Instructions (Addendum)
Thyroid blood tests are requested for you today.  We'll let you know about the results.   Please come back for a follow-up appointment in 1 year.   

## 2019-07-14 NOTE — Progress Notes (Signed)
Subjective:    Patient ID: Anna Hawkins, female    DOB: [birth year], 43 y.o.   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  pt states she feels well in general.  Specifically, she denies fullness at the neck.   Past Medical History:  Diagnosis Date  . Asthma   . Chlamydia   . Diabetes mellitus   . Fatty liver   . Graves disease   . History of shoulder dystocia in prior pregnancy   . Obesity     Past Surgical History:  Procedure Laterality Date  . LAPAROSCOPIC TUBAL LIGATION  10/29/2011   Procedure: LAPAROSCOPIC TUBAL LIGATION;  Surgeon: Mora Bellman, MD;  Location: [facility];  Service: Gynecology;  Laterality: Bilateral;  . THERAPEUTIC ABORTION    . WISDOM TOOTH EXTRACTION      Social History   Socioeconomic History  . Marital status: Single    Spouse name: Not on file  . Number of children: Not on file  . Years of education: Not on file  . Highest education level: Not on file  Occupational History  . Not on file  Social Needs  . Financial resource strain: Not on file  . Food insecurity    Worry: Not on file    Inability: Not on file  . Transportation needs    Medical: Not on file    Non-medical: Not on file  Tobacco Use  . Smoking status: Never Smoker  . Smokeless tobacco: Never Used  Substance and Sexual Activity  . Alcohol use: No  . Drug use: No  . Sexual activity: Yes    Birth control/protection: Surgical  Lifestyle  . Physical activity    Days per week: Not on file    Minutes per session: Not on file  . Stress: Not on file  Relationships  . Social Herbalist on phone: Not on file    Gets together: Not on file    Attends religious service: Not on file     Active member of club or organization: Not on file    Attends meetings of clubs or organizations: Not on file    Relationship status: Not on file  . Intimate partner violence    Fear of current or ex partner: Not on file    Emotionally abused: Not on file    Physically abused: Not on file    Forced sexual activity: Not on file  Other Topics Concern  . Not on file  Social History Narrative  . Not on file    Current Outpatient Medications on File Prior to Visit  Medication Sig Dispense Refill  . albuterol (PROVENTIL HFA;VENTOLIN HFA) 108 (90 Base) MCG/ACT inhaler Inhale 1-2 puffs into the lungs every 4 (four) hours as needed for wheezing or shortness of breath. 18 g 4  . blood glucose meter kit and supplies KIT Dispense based on patient and insurance preference. Use up to four times daily as directed. (FOR ICD-9 250.00, 250.01). 1 each 0  . ferrous sulfate 325 (65 FE) MG tablet TAKE 3 TABLETS BY MOUTH 3 TIMES A DAY WITH MEALS 270 tablet 1  . fexofenadine (ALLEGRA) 180 MG tablet TAKE 1  TABLET BY MOUTH EVERY DAY 30 tablet 3  . fluticasone (FLONASE) 50 MCG/ACT nasal spray Place 2 sprays into both nostrils daily as needed for allergies. 16 g 3  . glucose blood test strip Use as instructed 100 each 12  . hydrochlorothiazide (HYDRODIURIL) 25 MG tablet TAKE 1 TABLET BY MOUTH EVERY DAY 30 tablet 1  . Lancet Device MISC 1 Device by Does not apply route daily. 90 each 3  . Lancets (ACCU-CHEK MULTICLIX) lancets Use as instructed 100 each 12  . levothyroxine (SYNTHROID) 112 MCG tablet TAKE 1 TABLET BY MOUTH EVERY DAY BEFORE BREAKFAST 30 tablet 6  . losartan (COZAAR) 100 MG tablet TAKE 1 TABLET BY MOUTH EVERY DAY 90 tablet 1  . pantoprazole (PROTONIX) 20 MG tablet Take 20 mg by mouth daily.    . ursodiol (ACTIGALL) 250 MG tablet Take 250 mg by mouth 3 (three) times daily.     No current facility-administered medications on file prior to visit.     Allergies  Allergen Reactions  . Lisinopril  Rash and Cough    Family History  Problem Relation Age of Onset  . Diabetes Mother   . Hypertension Mother   . Asthma Mother   . Thyroid disease Mother   . Diabetes Brother   . Hypertension Brother   . Asthma Brother   . Diabetes Father   . Cancer Father   . Asthma Father   . Hypertension Father     BP 118/72 (BP Location: Left Wrist, Patient Position: Sitting, Cuff Size: Large)   Pulse 90   Ht _0  (1.676 m)   Wt 265 lb (120.2 kg)   SpO2 92%   BMI 42.77 kg/m    Review of Systems Denies neck pain.      Objective:   Physical Exam non fasting NECK: There is no palpable thyroid enlargement.  No thyroid nodule is palpable.  No palpable lymphadenopathy at the anterior neck.     Lab Results  Component Value Date   TSH 0.64 07/14/2019      Assessment & Plan:  Goiter: she declines f/u US this year. Post-RAI hypothyroidism.  Well-replaced.  Please continue the same medication Please come back for a follow-up appointment in 1 year.

## 2019-08-25 ENCOUNTER — Other Ambulatory Visit: Payer: Self-pay

## 2019-08-25 MED ORDER — ALBUTEROL SULFATE HFA 108 (90 BASE) MCG/ACT IN AERS: 1.0000 | INHALATION_SPRAY | RESPIRATORY_TRACT | 4 refills | Status: DC | PRN

## 2019-10-22 ENCOUNTER — Encounter (HOSPITAL_COMMUNITY): Payer: Self-pay

## 2019-10-22 ENCOUNTER — Ambulatory Visit (HOSPITAL_COMMUNITY)
Admission: EM | Admit: 2019-10-22 | Discharge: 2019-10-22 | Disposition: A | Discharge status: Home / Self Care | Payer: 59 | Attending: Emergency Medicine | Admitting: Emergency Medicine

## 2019-10-22 ENCOUNTER — Other Ambulatory Visit: Payer: Self-pay

## 2019-10-22 DIAGNOSIS — R109 Unspecified abdominal pain: Secondary | ICD-10-CM | POA: Diagnosis not present

## 2019-10-22 DIAGNOSIS — Z87442 Personal history of urinary calculi: Secondary | ICD-10-CM

## 2019-10-22 MED ORDER — TAMSULOSIN HCL 0.4 MG PO CAPS: 0.4000 mg | ORAL_CAPSULE | Freq: Every day | ORAL | 0 refills | Status: DC

## 2019-10-22 MED ORDER — KETOROLAC TROMETHAMINE 60 MG/2ML IM SOLN
INTRAMUSCULAR | Status: AC
  Filled 2019-10-22: qty 2

## 2019-10-22 MED ORDER — NAPROXEN 500 MG PO TABS: 500.0000 mg | ORAL_TABLET | Freq: Two times a day (BID) | ORAL | 0 refills | Status: DC

## 2019-10-22 MED ORDER — ONDANSETRON 4 MG PO TBDP: 4.0000 mg | ORAL_TABLET | Freq: Three times a day (TID) | ORAL | 0 refills | Status: DC | PRN

## 2019-10-22 MED ORDER — KETOROLAC TROMETHAMINE 60 MG/2ML IM SOLN
60.0000 mg | Freq: Once | INTRAMUSCULAR | Status: AC
  Administered 2019-10-22: 60 mg via INTRAMUSCULAR

## 2019-10-22 NOTE — ED Notes (Signed)
Patient states she is still unable to provide a urine sample after drinking water.

## 2019-10-22 NOTE — ED Notes (Signed)
Patient states unable to void at this time.  Will continue to monitor.  

## 2019-10-22 NOTE — ED Triage Notes (Signed)
Pt states she has right side back pain. Pt states she thinks it maybe her kidneys. This started today 2 hours ago.

## 2019-10-22 NOTE — Discharge Instructions (Signed)
I am treating you for kidney stone, we are sending the urine for culture to confirm for any sign of infection We gave you a shot of Toradol today to help with your pain Please continue Naprosyn twice daily to help with pain Zofran as needed for nausea Take daily Flomax to help pass any stone  Please monitor your pain and symptoms, follow-up in the emergency room if symptoms progressing worsening, developing more abdominal pain, fevers, persistent vomiting

## 2019-10-23 LAB — POCT URINALYSIS DIP (DEVICE)
Glucose, UA: NEGATIVE mg/dL
Ketones, ur: NEGATIVE mg/dL
Nitrite: NEGATIVE
Protein, ur: 100 mg/dL — AB
Specific Gravity, Urine: >=1.03 (ref 1.005–1.030)
Urobilinogen, UA: 0.2 mg/dL (ref 0.0–1.0)
pH: 5.5 (ref 5.0–8.0)

## 2019-10-23 NOTE — ED Provider Notes (Signed)
[facility]    CSN: [id number] Arrival date & time: 10/22/19  1401      History   Chief Complaint Chief Complaint  Patient presents with   Back Pain    HPI EVANNY ELLERBE is a 43 y.o. female history of asthma, DM type II, hypertension, hypothyroidism presenting today for evaluation of back pain.  Patient states that approximately 2 hours ago she had a sudden onset of right\flank pain.  Hurts with movement.  She has taken some Tylenol without significant relief.  She is concerned that she had gastric sleeve surgery approximately 1 year ago.  She has had associated nausea and one episode of dry heaving.  She denies any urinary symptoms of dysuria, increased frequency, urgency.  Denies hematuria.  She does note that she has had 1 previous kidney stone which was many many years ago.  She cannot recall if symptoms feel similar.  She denies any chest pain or shortness of breath.  She denies any recent cough or URI symptoms.  She denies any fall, injury, increase in activity or heavy lifting of recently.  HPI  Past Medical History:  Diagnosis Date   Asthma    Chlamydia    Diabetes mellitus    Fatty liver    Graves disease    History of shoulder dystocia in prior pregnancy    Obesity     Patient Active Problem List   Diagnosis Date Noted   Ganglion cyst of volar aspect of right wrist 12/09/2018   Multinodular goiter 07/13/2018   Iron deficiency anemia due to chronic blood loss 07/13/2018   Environmental allergies 07/15/2017   Hypothyroidism 01/19/2017   Allergic rhinitis 02/03/2016   Type 2 diabetes mellitus without complication ([place]) 76/81/1572   Physical exam, annual 11/03/2014   Essential hypertension 10/29/2014   Knee pain 10/29/2014   Diarrhea 10/29/2014   Morbid obesity ([place]) 10/19/2011    Past Surgical History:  Procedure Laterality Date   LAPAROSCOPIC TUBAL LIGATION  10/29/2011   Procedure: LAPAROSCOPIC TUBAL LIGATION;  Surgeon:  Mora Bellman, MD;  Location: [facility];  Service: Gynecology;  Laterality: Bilateral;   THERAPEUTIC ABORTION     WISDOM TOOTH EXTRACTION      OB History    Gravida  6   Para  2   Term  2   Preterm  0   AB  4   Living  2     SAB  2   TAB  2   Ectopic  0   Multiple  0   Live Births               Home Medications    Prior to Admission medications   Medication Sig Start Date End Date Taking? Authorizing Provider  albuterol (VENTOLIN HFA) 108 (90 Base) MCG/ACT inhaler Inhale 1-2 puffs into the lungs every 4 (four) hours as needed for wheezing or shortness of breath. 08/25/19   Kathrene Alu, MD  blood glucose meter kit and supplies KIT Dispense based on patient and insurance preference. Use up to four times daily as directed. (FOR ICD-9 250.00, 250.01). 10/12/16   Rumley, [place] N, DO  ferrous sulfate 325 (65 FE) MG tablet TAKE 3 TABLETS BY MOUTH 3 TIMES A DAY WITH MEALS 09/26/18   Winfrey, Alcario Drought, MD  fexofenadine (ALLEGRA) 180 MG tablet TAKE 1 TABLET BY MOUTH EVERY DAY 11/22/18   Winfrey, Alcario Drought, MD  fluticasone (FLONASE) 50 MCG/ACT nasal spray Place 2 sprays into both nostrils  daily as needed for allergies. 07/15/17   Kathrene Alu, MD  glucose blood test strip Use as instructed 12/04/15   Rumley, [place] N, DO  hydrochlorothiazide (HYDRODIURIL) 25 MG tablet TAKE 1 TABLET BY MOUTH EVERY DAY 12/22/18   Dickie La, MD  Lancet Device MISC 1 Device by Does not apply route daily. 12/27/14   Rumley, Burna Cash, DO  Lancets (ACCU-CHEK MULTICLIX) lancets Use as instructed 12/04/15   Lorna Few, DO  levothyroxine (SYNTHROID) 112 MCG tablet TAKE 1 TABLET BY MOUTH EVERY DAY BEFORE BREAKFAST 07/11/19   Renato Shin, MD  losartan (COZAAR) 100 MG tablet TAKE 1 TABLET BY MOUTH EVERY DAY 01/27/19   Kathrene Alu, MD  naproxen (NAPROSYN) 500 MG tablet Take 1 tablet (500 mg total) by mouth 2 (two) times daily. 10/22/19   Jenisha Faison C, PA-C  ondansetron (ZOFRAN ODT) 4  MG disintegrating tablet Take 1 tablet (4 mg total) by mouth every 8 (eight) hours as needed for nausea or vomiting. 10/22/19   Lawson Mahone C, PA-C  pantoprazole (PROTONIX) 20 MG tablet Take 20 mg by mouth daily.    [provider]  tamsulosin (FLOMAX) 0.4 MG CAPS capsule Take 1 capsule (0.4 mg total) by mouth daily. 10/22/19   Mellony Danziger C, PA-C  ursodiol (ACTIGALL) 250 MG tablet Take 250 mg by mouth 3 (three) times daily.    [provider]    Family History Family History  Problem Relation Age of Onset   Diabetes Mother    Hypertension Mother    Asthma Mother    Thyroid disease Mother    Diabetes Brother    Hypertension Brother    Asthma Brother    Diabetes Father    Cancer Father    Asthma Father    Hypertension Father     Social History Social History   Tobacco Use   Smoking status: Never Smoker   Smokeless tobacco: Never Used  Substance Use Topics   Alcohol use: No   Drug use: No     Allergies   Lisinopril   Review of Systems Review of Systems  Constitutional: Negative for fever.  Respiratory: Negative for shortness of breath.   Cardiovascular: Negative for chest pain.  Gastrointestinal: Positive for nausea. Negative for abdominal pain, diarrhea and vomiting.  Genitourinary: Positive for flank pain. Negative for dysuria, genital sores, hematuria, menstrual problem, vaginal bleeding, vaginal discharge and vaginal pain.  Musculoskeletal: Positive for back pain.  Skin: Negative for rash.  Neurological: Negative for dizziness, light-headedness and headaches.     Physical Exam Triage Vital Signs ED Triage Vitals  Enc Vitals Group     BP 10/22/19 1419 (!) 150/81     Pulse Rate 10/22/19 1419 68     Resp 10/22/19 1419 18     Temp 10/22/19 1419 98.3 F (36.8 C)     Temp Source 10/22/19 1419 Oral     SpO2 10/22/19 1419 100 %     Weight --      Height --      Head Circumference --      Peak Flow --      Pain Score  10/22/19 1612 5     Pain Loc --      Pain Edu? --      Excl. in [place]? --    No data found.  Updated Vital Signs BP (!) 150/81 (BP Location: Right Arm)    Pulse 68    Temp 98.3 F (36.8  C) (Oral)    Resp 18    LMP 10/05/2019    SpO2 100%   Visual Acuity Right Eye Distance:   Left Eye Distance:   Bilateral Distance:    Right Eye Near:   Left Eye Near:    Bilateral Near:     Physical Exam Vitals signs and nursing note reviewed.  Constitutional:      General: She is not in acute distress.    Appearance: She is well-developed.  HENT:     Head: Normocephalic and atraumatic.     Mouth/Throat:     Comments: Oral mucosa pink and moist, no tonsillar enlargement or exudate. Posterior pharynx patent and nonerythematous, no uvula deviation or swelling. Normal phonation.  Eyes:     Conjunctiva/sclera: Conjunctivae normal.  Neck:     Musculoskeletal: Neck supple.  Cardiovascular:     Rate and Rhythm: Normal rate and regular rhythm.     Heart sounds: No murmur.  Pulmonary:     Effort: Pulmonary effort is normal. No respiratory distress.     Breath sounds: Normal breath sounds.     Comments: Breathing comfortably at rest, CTABL, no wheezing, rales or other adventitious sounds auscultated  Abdominal:     Palpations: Abdomen is soft.     Tenderness: There is no abdominal tenderness.     Comments: Abdomen soft, nondistended, tenderness to palpation throughout right upper and lower quadrants, more tender to follow abdomen/flank, but does have tenderness in right upper quadrant.  Negative Murphy's, negative McBurney's, no rebound.  Musculoskeletal:     Comments: Nontender to palpation of the cervical, thoracic or lumbar spine midline.  Tenderness to palpation to upper lumbar/lower thoracic musculature on right side extending to flank  Skin:    General: Skin is warm and dry.  Neurological:     Mental Status: She is alert.      UC Treatments / Results  Labs (all labs ordered are  listed, but only abnormal results are displayed) Labs Reviewed  POCT URINALYSIS DIP (DEVICE) - Abnormal; Notable for the following components:      Result Value   Bilirubin Urine SMALL (*)    Hgb urine dipstick LARGE (*)    Protein, ur 100 (*)    Leukocytes,Ua TRACE (*)    All other components within normal limits  URINE CULTURE    EKG   Radiology No results found.  Procedures Procedures (including critical care time)  Medications Ordered in UC Medications  ketorolac (TORADOL) injection 60 mg (60 mg Intramuscular Given 10/22/19 1607)  ketorolac (TORADOL) 60 MG/2ML injection (has no administration in time range)    Initial Impression / Assessment and Plan / UC Course  I have reviewed the triage vital signs and the nursing notes.  Pertinent labs & imaging results that were available during my care of the patient were reviewed by me and considered in my medical decision making (see chart for details).     UA with large hemoglobin, trace leuks.  Will send urine for culture to rule out UTI, but currently without irritative UTI symptoms, given history of previous kidney stone with acute onset of pain will go ahead and empirically treat for this at this time.  Discussed possible muscular etiology as well given tenderness to palpation.  Will provide Toradol prior to discharge.  Initiating on Flomax, Zofran as needed for nausea, Naprosyn for pain at home.  Pending urine culture, if positive for UTI will place on antibiotics, monitor for resolution of symptoms with continued  use of anti-inflammatories, strain urine to check for stone passing.  If she develops more abdominal pain and persistent nausea vomiting recommending to also follow-up in emergency room to rule out possible gallbladder etiology of symptoms.  Given symptoms began 2 hours ago we will continue to monitor closely. Discussed strict return precautions. Patient verbalized understanding and is agreeable with plan.  Final  Clinical Impressions(s) / UC Diagnoses   Final diagnoses:  Right flank pain     Discharge Instructions     I am treating you for kidney stone, we are sending the urine for culture to confirm for any sign of infection We gave you a shot of Toradol today to help with your pain Please continue Naprosyn twice daily to help with pain Zofran as needed for nausea Take daily Flomax to help pass any stone  Please monitor your pain and symptoms, follow-up in the emergency room if symptoms progressing worsening, developing more abdominal pain, fevers, persistent vomiting   ED Prescriptions    Medication Sig Dispense Auth. Provider   tamsulosin (FLOMAX) 0.4 MG CAPS capsule Take 1 capsule (0.4 mg total) by mouth daily. 10 capsule Gayatri Teasdale C, PA-C   naproxen (NAPROSYN) 500 MG tablet Take 1 tablet (500 mg total) by mouth 2 (two) times daily. 30 tablet Tyshika Baldridge C, PA-C   ondansetron (ZOFRAN ODT) 4 MG disintegrating tablet Take 1 tablet (4 mg total) by mouth every 8 (eight) hours as needed for nausea or vomiting. 20 tablet Margo Lama, [place] C, PA-C     PDMP not reviewed this encounter.   Janith Lima, PA-C 10/23/19 1126

## 2019-10-24 LAB — URINE CULTURE: Culture: 80000 — AB

## 2019-10-26 ENCOUNTER — Telehealth: Payer: Self-pay | Admitting: Emergency Medicine

## 2019-10-26 MED ORDER — CEPHALEXIN 500 MG PO CAPS: 500.0000 mg | ORAL_CAPSULE | Freq: Two times a day (BID) | ORAL | 0 refills | Status: AC

## 2019-10-26 NOTE — Telephone Encounter (Signed)
Urine culture not treated, contacted pt, states she stil has the off and on flank pain. Per traci okay to send Keflex BID x5 days. Pt verbalized understanding, all questions answered.

## 2020-03-16 ENCOUNTER — Other Ambulatory Visit: Payer: Self-pay | Admitting: Endocrinology

## 2020-04-10 ENCOUNTER — Encounter: Payer: Self-pay | Admitting: Family Medicine

## 2020-04-10 ENCOUNTER — Other Ambulatory Visit: Payer: Self-pay

## 2020-04-10 ENCOUNTER — Ambulatory Visit (INDEPENDENT_AMBULATORY_CARE_PROVIDER_SITE_OTHER): Payer: 59 | Admitting: Family Medicine

## 2020-04-10 VITALS — BP 130/78 | HR 91 | Ht 66.0 in | Wt 251.2 lb

## 2020-04-10 DIAGNOSIS — E119 Type 2 diabetes mellitus without complications: Secondary | ICD-10-CM

## 2020-04-10 DIAGNOSIS — Z9109 Other allergy status, other than to drugs and biological substances: Secondary | ICD-10-CM

## 2020-04-10 DIAGNOSIS — I10 Essential (primary) hypertension: Secondary | ICD-10-CM

## 2020-04-10 DIAGNOSIS — D500 Iron deficiency anemia secondary to blood loss (chronic): Secondary | ICD-10-CM

## 2020-04-10 DIAGNOSIS — N939 Abnormal uterine and vaginal bleeding, unspecified: Secondary | ICD-10-CM

## 2020-04-10 DIAGNOSIS — E890 Postprocedural hypothyroidism: Secondary | ICD-10-CM

## 2020-04-10 LAB — POCT GLYCOSYLATED HEMOGLOBIN (HGB A1C): HbA1c, POC (controlled diabetic range): 5.7 % (ref 0.0–7.0)

## 2020-04-10 MED ORDER — CETIRIZINE HCL 10 MG PO TABS: 10.0000 mg | ORAL_TABLET | Freq: Every day | ORAL | 11 refills | Status: DC

## 2020-04-10 NOTE — Progress Notes (Signed)
    SUBJECTIVE:   CHIEF COMPLAINT / HPI:   Type 2 diabetes follow-up CBGs: high 90s, low 100s Since bariatric surgery, patient has not needed to take any medication due to low hemoglobin A1c. Continues to eat carefully and control portion size after her gastric sleeve surgery  Allergies Postnasal drip, cough Occurs yearly No rhinorrhea, fever, no sick contacts Got J&J Covid vaccine in mid March Not interested in Flonase at this time, but would like Zyrtec  Irregular menstrual bleeding Patient reports that she will have a heavy menstrual period about every 6 months, but she will sometimes have irregular spotting.  She wonders if she is perimenopausal.  She has a history of a bilateral tubal ligation.  She also has a history of anemia likely caused by abnormal uterine bleeding.  She continues to take ferrous sulfate.  PERTINENT  PMH / PSH: Hypertension, type 2 diabetes, gastric sleeve surgery, iron deficiency anemia due to chronic blood loss, post ablative hypothyroidism  OBJECTIVE:   BP 130/78   Pulse 91   Ht 5\' 6"  (1.676 m)   Wt 251 lb 3.2 oz (113.9 kg)   LMP 04/05/2020 (Exact Date)   SpO2 100%   BMI 40.54 kg/m   General: well appearing, appears stated age Cardiac: RRR, no MRG Respiratory: CTAB, no rhonchi, rales, or wheezing, normal work of breathing Skin: no rashes or other lesions, warm and well perfused Psych: appropriate mood and affect   ASSESSMENT/PLAN:   Environmental allergies Will prescribe Zyrtec.  Hypothyroidism Managed by endocrinology, TSH was normal in 2020.  Essential hypertension Controlled today, will check a BMP.  Type 2 diabetes mellitus without complication (HCC) Very well controlled due to bariatric surgery.  Hemoglobin A1c today is 5.7.  We will continue without diabetes medications.  Iron deficiency anemia due to chronic blood loss We will check a CBC today.  We will also check an [place] and LH due to patient's irregular bleeding.  If her  [place] and LH are high indicating menopause, will need to investigate other sources of patient's anemia if she continues to be anemic.     Kathrene Alu, MD [place]

## 2020-04-10 NOTE — Patient Instructions (Addendum)
It was nice seeing you today Ms. Montesdeoca!  Your hemoglobin A1c looks very good today.  I am sending in Zyrtec for her allergies.  Please let me know if you would like to try Flonase as well.  I am also checking a few labs to see why you are having irregular bleeding.  We will be checking your kidney function and your blood count today as well, and I will be in touch about the lab results.  I would like to see you back in about 1 year.  If you have any questions or concerns, please feel free to call the clinic.   Be well,  Dr. Shan Levans

## 2020-04-11 LAB — CBC
Hematocrit: 27.9 % — ABNORMAL LOW (ref 34.0–46.6)
Hemoglobin: 7.8 g/dL — ABNORMAL LOW (ref 11.1–15.9)
MCH: 18.3 pg — ABNORMAL LOW (ref 26.6–33.0)
MCHC: 28 g/dL — ABNORMAL LOW (ref 31.5–35.7)
MCV: 66 fL — ABNORMAL LOW (ref 79–97)
Platelets: 469 10*3/uL — ABNORMAL HIGH (ref 150–450)
RBC: 4.26 x10E6/uL (ref 3.77–5.28)
RDW: 18 % — ABNORMAL HIGH (ref 11.7–15.4)
WBC: 6.9 10*3/uL (ref 3.4–10.8)

## 2020-04-11 LAB — BASIC METABOLIC PANEL
BUN/Creatinine Ratio: 12 (ref 9–23)
BUN: 8 mg/dL (ref 6–24)
CO2: 19 mmol/L — ABNORMAL LOW (ref 20–29)
Calcium: 9 mg/dL (ref 8.7–10.2)
Chloride: 105 mmol/L (ref 96–106)
Creatinine, Ser: 0.66 mg/dL (ref 0.57–1.00)
GFR calc Af Amer: 124 mL/min/{1.73_m2} (ref >59)
GFR calc non Af Amer: 108 mL/min/{1.73_m2} (ref >59)
Glucose: 84 mg/dL (ref 65–99)
Potassium: 3.5 mmol/L (ref 3.5–5.2)
Sodium: 140 mmol/L (ref 134–144)

## 2020-04-11 LAB — LUTEINIZING HORMONE: LH: 6 m[IU]/mL

## 2020-04-11 LAB — FOLLICLE STIMULATING HORMONE: FSH: 6.9 m[IU]/mL

## 2020-04-11 NOTE — Assessment & Plan Note (Addendum)
Managed by endocrinology, TSH was normal in 2020.

## 2020-04-11 NOTE — Assessment & Plan Note (Signed)
Will prescribe Zyrtec.

## 2020-04-11 NOTE — Assessment & Plan Note (Signed)
Very well controlled due to bariatric surgery.  Hemoglobin A1c today is 5.7.  We will continue without diabetes medications.

## 2020-04-11 NOTE — Assessment & Plan Note (Signed)
Controlled today, will check a BMP.

## 2020-04-11 NOTE — Assessment & Plan Note (Signed)
We will check a CBC today.  We will also check an [place] and LH due to patient's irregular bleeding.  If her B[place] and LH are high indicating menopause, will need to investigate other sources of patient's anemia if she continues to be anemic.

## 2020-04-12 ENCOUNTER — Telehealth: Payer: Self-pay | Admitting: Family Medicine

## 2020-04-12 DIAGNOSIS — D500 Iron deficiency anemia secondary to blood loss (chronic): Secondary | ICD-10-CM

## 2020-04-12 NOTE — Telephone Encounter (Signed)
Discussed labs with Anna Hawkins.  I let her know that her LH and FSH are not consistent with menopause.  Her BMP also was normal.  However, her hemoglobin is 7.8, which is lower than it has been in the past.  Indices of her CBC indicate a microcytic anemia, likely due to her dysfunctional uterine bleeding.  I would like to get a transvaginal ultrasound, so I told the patient that one of my CMA's will be calling her to schedule this appointment.  She was agreeable to this plan.  She also told me that she is no longer taking iron because it caused upset stomach.  I told her that she can try taking it every other day and slowly increasing the amount she is taking as tolerated.  She may be a good candidate for the Mirena depending on what her ultrasound looks like.  Would you call her and schedule this ultrasound appointment?  Thank you

## 2020-04-12 NOTE — Telephone Encounter (Signed)
Called GI to schedule the [place].  It is scheduled for 04/17/20 @ [address], she is to arrive at 9:40 am for a 10:00 am appointment. Contacted pt and gave her this information. Susanna Benge Zimmerman Rumple, CMA

## 2020-04-17 ENCOUNTER — Other Ambulatory Visit: Payer: 59

## 2020-07-12 ENCOUNTER — Ambulatory Visit (INDEPENDENT_AMBULATORY_CARE_PROVIDER_SITE_OTHER): Payer: 59 | Admitting: Endocrinology

## 2020-07-12 ENCOUNTER — Encounter: Payer: Self-pay | Admitting: Endocrinology

## 2020-07-12 ENCOUNTER — Other Ambulatory Visit: Payer: Self-pay

## 2020-07-12 VITALS — BP 124/74 | HR 86 | Ht 66.0 in | Wt 255.4 lb

## 2020-07-12 DIAGNOSIS — E890 Postprocedural hypothyroidism: Secondary | ICD-10-CM | POA: Diagnosis not present

## 2020-07-12 LAB — T4, FREE: Free T4: 1.3 ng/dL (ref 0.60–1.60)

## 2020-07-12 LAB — TSH: TSH: 1.1 u[IU]/mL (ref 0.35–4.50)

## 2020-07-12 NOTE — Patient Instructions (Signed)
Thyroid blood tests are requested for you today.  We'll let you know about the results.  It is best to never miss the medication.  However, if you do miss it, next best is to double up the next time.  Please come back for a follow-up appointment in 1 year.

## 2020-07-12 NOTE — Progress Notes (Signed)
Subjective:    Patient ID: Anna Hawkins, female    DOB: [birth year], 44 y.o.   MRN: [id number]  HPI Pt returns for f/u of post-RAI hypothyroidism (hyperthyroidism was dx'ed 2017; she had been noted to have a goiter in 2002; US showed features of both Grave's Dz and small multinodular goiter; she has had TL; she had RAI in July, 2017; she started synthroid in late 2017; f/u US in 2019 showed goiter was smaller).  pt states she feels well in general.  Specifically, she denies palpitations and tremor.   Past Medical History:  Diagnosis Date  . Asthma   . Chlamydia   . Diabetes mellitus   . Fatty liver   . Graves disease   . History of shoulder dystocia in prior pregnancy   . Obesity     Past Surgical History:  Procedure Laterality Date  . LAPAROSCOPIC TUBAL LIGATION  10/29/2011   Procedure: LAPAROSCOPIC TUBAL LIGATION;  Surgeon: Mora Bellman, MD;  Location: [facility];  Service: Gynecology;  Laterality: Bilateral;  . THERAPEUTIC ABORTION    . WISDOM TOOTH EXTRACTION      Social History   Socioeconomic History  . Marital status: Single    Spouse name: Not on file  . Number of children: Not on file  . Years of education: Not on file  . Highest education level: Not on file  Occupational History  . Not on file  Tobacco Use  . Smoking status: Never Smoker  . Smokeless tobacco: Never Used  Substance and Sexual Activity  . Alcohol use: No  . Drug use: No  . Sexual activity: Yes    Birth control/protection: Surgical  Other Topics Concern  . Not on file  Social History Narrative  . Not on file   Social Determinants of Health   Financial Resource Strain:   . Difficulty of Paying Living Expenses:   Food Insecurity:   . Worried About Charity fundraiser in the Last Year:   . Arboriculturist in the Last Year:   Transportation Needs:   . Film/video editor (Medical):   Marland Kitchen Lack of Transportation (Non-Medical):   Physical Activity:   . Days of Exercise per Week:   .  Minutes of Exercise per Session:   Stress:   . Feeling of Stress :   Social Connections:   . Frequency of Communication with Friends and Family:   . Frequency of Social Gatherings with Friends and Family:   . Attends Religious Services:   . Active Member of Clubs or Organizations:   . Attends Archivist Meetings:   Marland Kitchen Marital Status:   Intimate Partner Violence:   . Fear of Current or Ex-Partner:   . Emotionally Abused:   Marland Kitchen Physically Abused:   . Sexually Abused:     Current Outpatient Medications on File Prior to Visit  Medication Sig Dispense Refill  . albuterol (VENTOLIN HFA) 108 (90 Base) MCG/ACT inhaler Inhale 1-2 puffs into the lungs every 4 (four) hours as needed for wheezing or shortness of breath. 18 g 4  . blood glucose meter kit and supplies KIT Dispense based on patient and insurance preference. Use up to four times daily as directed. (FOR ICD-9 250.00, 250.01). 1 each 0  . cetirizine (ZYRTEC) 10 MG tablet Take 1 tablet (10 mg total) by mouth daily. 30 tablet 11  . ferrous sulfate 325 (65 FE) MG tablet TAKE 3 TABLETS BY MOUTH 3 TIMES A DAY WITH MEALS 270  tablet 1  . glucose blood test strip Use as instructed 100 each 12  . hydrochlorothiazide (HYDRODIURIL) 25 MG tablet TAKE 1 TABLET BY MOUTH EVERY DAY 30 tablet 1  . Lancet Device MISC 1 Device by Does not apply route daily. 90 each 3  . Lancets (ACCU-CHEK MULTICLIX) lancets Use as instructed 100 each 12  . levothyroxine (SYNTHROID) 112 MCG tablet TAKE 1 TABLET BY MOUTH EVERY DAY BEFORE BREAKFAST 30 tablet 6  . losartan (COZAAR) 100 MG tablet TAKE 1 TABLET BY MOUTH EVERY DAY 90 tablet 1  . naproxen (NAPROSYN) 500 MG tablet Take 1 tablet (500 mg total) by mouth 2 (two) times daily. 30 tablet 0  . ondansetron (ZOFRAN ODT) 4 MG disintegrating tablet Take 1 tablet (4 mg total) by mouth every 8 (eight) hours as needed for nausea or vomiting. 20 tablet 0  . pantoprazole (PROTONIX) 20 MG tablet Take 20 mg by mouth daily.     . tamsulosin (FLOMAX) 0.4 MG CAPS capsule Take 1 capsule (0.4 mg total) by mouth daily. 10 capsule 0  . ursodiol (ACTIGALL) 250 MG tablet Take 250 mg by mouth 3 (three) times daily.     No current facility-administered medications on file prior to visit.    Allergies  Allergen Reactions  . Lisinopril Rash and Cough    Family History  Problem Relation Age of Onset  . Diabetes Mother   . Hypertension Mother   . Asthma Mother   . Thyroid disease Mother   . Diabetes Brother   . Hypertension Brother   . Asthma Brother   . Diabetes Father   . Cancer Father   . Asthma Father   . Hypertension Father     BP 124/74   Pulse 86   Ht '5\' 6"'  (1.676 m)   Wt (!) 255 lb 6.4 oz (115.8 kg)   SpO2 97%   BMI 41.22 kg/m    Review of Systems     Objective:   Physical Exam VITAL SIGNS:  See vs page GENERAL: no distress NECK: thyroid is slightly enlarged, with irreg surface.    Lab Results  Component Value Date   TSH 0.64 07/14/2019        Assessment & Plan:  Hypothyroidism: well-replaced.  Please continue the same medication Please come back for a follow-up appointment in 1 year.

## 2020-08-07 ENCOUNTER — Ambulatory Visit (HOSPITAL_COMMUNITY)
Admission: EM | Admit: 2020-08-07 | Discharge: 2020-08-07 | Disposition: A | Discharge status: Home / Self Care | Payer: 59 | Attending: Family Medicine | Admitting: Family Medicine

## 2020-08-07 ENCOUNTER — Encounter (HOSPITAL_COMMUNITY): Payer: Self-pay | Admitting: Emergency Medicine

## 2020-08-07 ENCOUNTER — Other Ambulatory Visit: Payer: Self-pay

## 2020-08-07 DIAGNOSIS — H6983 Other specified disorders of Eustachian tube, bilateral: Secondary | ICD-10-CM

## 2020-08-07 DIAGNOSIS — H938X3 Other specified disorders of ear, bilateral: Secondary | ICD-10-CM

## 2020-08-07 MED ORDER — PSEUDOEPH-BROMPHEN-DM 30-2-10 MG/5ML PO SYRP: 5.0000 mL | ORAL_SOLUTION | Freq: Four times a day (QID) | ORAL | 0 refills | Status: DC | PRN

## 2020-08-07 NOTE — ED Triage Notes (Signed)
Both ears are stuffy and hurting since yesterday.   Denies sore throat denies fever.  Face feels pressure and stuffiness    Patient has tried mucinex and tylenol.  Tried saline rinse.

## 2020-08-08 NOTE — ED Provider Notes (Signed)
[place]   [id number] 08/07/20 Arrival Time: 3086  ASSESSMENT & PLAN:  1. Ear pressure, bilateral   2. Eustachian tube dysfunction, bilateral     Begin trial of: Meds ordered this encounter  Medications  . brompheniramine-pseudoephedrine-DM 30-2-10 MG/5ML syrup    Sig: Take 5 mLs by mouth 4 (four) times daily as needed.    Dispense:  120 mL    Refill:  0     Follow-up Information    Carollee Leitz, MD.   Specialty: Family Medicine Why: As needed. Contact information: [address] [place] [address] [phone]                Reviewed expectations re: course of current medical issues. Questions answered. Outlined signs and symptoms indicating need for more acute intervention. Patient verbalized understanding. After Visit Summary given.   SUBJECTIVE: History from: patient.  Anna Hawkins is a 44 y.o. female who presents with complaint of ear pressure; bilateral; over some time but worse yesterday. Afebrile. No sore throat. Muffled hearing. No drainage or bleeding. No OTC tx.   Social History   Tobacco Use  Smoking Status Never Smoker  Smokeless Tobacco Never Used      OBJECTIVE:  Vitals:   08/07/20 1906  Pulse: (!) 106  Resp: 18  Temp: 98.2 F (36.8 C)  TempSrc: Oral  SpO2: 100%     General appearance: alert; appears fatigued Ear Canal: normal TM: bilateral: serous fluid noted Neck: supple without LAD Lungs: unlabored respirations, symmetrical air entry; cough: absent; no respiratory distress Skin: warm and dry Psychological: alert and cooperative; normal mood and affect  Allergies  Allergen Reactions  . Lisinopril Rash and Cough    Past Medical History:  Diagnosis Date  . Asthma   . Chlamydia   . Diabetes mellitus   . Fatty liver   . Graves disease   . History of shoulder dystocia in prior pregnancy   . Obesity    Family History  Problem Relation Age of Onset  . Diabetes Mother   . Hypertension  Mother   . Asthma Mother   . Thyroid disease Mother   . Diabetes Brother   . Hypertension Brother   . Asthma Brother   . Diabetes Father   . Cancer Father   . Asthma Father   . Hypertension Father    Social History   Socioeconomic History  . Marital status: Single    Spouse name: Not on file  . Number of children: Not on file  . Years of education: Not on file  . Highest education level: Not on file  Occupational History  . Not on file  Tobacco Use  . Smoking status: Never Smoker  . Smokeless tobacco: Never Used  Substance and Sexual Activity  . Alcohol use: No  . Drug use: No  . Sexual activity: Yes    Birth control/protection: Surgical  Other Topics Concern  . Not on file  Social History Narrative  . Not on file   Social Determinants of Health   Financial Resource Strain:   . Difficulty of Paying Living Expenses: Not on file  Food Insecurity:   . Worried About Charity fundraiser in the Last Year: Not on file  . Ran Out of Food in the Last Year: Not on file  Transportation Needs:   . Lack of Transportation (Medical): Not on file  . Lack of Transportation (Non-Medical): Not on file  Physical Activity:   . Days of  Exercise per Week: Not on file  . Minutes of Exercise per Session: Not on file  Stress:   . Feeling of Stress : Not on file  Social Connections:   . Frequency of Communication with Friends and Family: Not on file  . Frequency of Social Gatherings with Friends and Family: Not on file  . Attends Religious Services: Not on file  . Active Member of Clubs or Organizations: Not on file  . Attends Archivist Meetings: Not on file  . Marital Status: Not on file  Intimate Partner Violence:   . Fear of Current or Ex-Partner: Not on file  . Emotionally Abused: Not on file  . Physically Abused: Not on file  . Sexually Abused: Not on file            Vanessa Kick, MD 08/08/20 1053

## 2020-12-07 ENCOUNTER — Ambulatory Visit: Payer: 59 | Attending: Internal Medicine

## 2020-12-07 DIAGNOSIS — Z23 Encounter for immunization: Secondary | ICD-10-CM

## 2020-12-07 NOTE — Progress Notes (Signed)
   Covid-19 Vaccination Clinic  Name:  Anna Hawkins    MRN: [id number] DOB: 1976/08/06  12/07/2020  Anna Hawkins was observed post Covid-19 immunization for 15 minutes without incident. She was provided with Vaccine Information Sheet and instruction to access the V-Safe system.   Anna Hawkins was instructed to call 911 with any severe reactions post vaccine: Marland Kitchen Difficulty breathing  . Swelling of face and throat  . A fast heartbeat  . A bad rash all over body  . Dizziness and weakness   Immunizations Administered    Name Date Dose VIS Date Route   JANSSEN COVID-19 VACCINE 12/07/2020 10:37 AM 0.5 mL 10/09/2020 Intramuscular   Manufacturer: Anna Hawkins   Lot: [id number]   Savage: 44920-100-71

## 2020-12-21 ENCOUNTER — Other Ambulatory Visit: Payer: Self-pay | Admitting: Endocrinology

## 2021-07-15 ENCOUNTER — Ambulatory Visit (INDEPENDENT_AMBULATORY_CARE_PROVIDER_SITE_OTHER): Payer: 59 | Admitting: Endocrinology

## 2021-07-15 ENCOUNTER — Other Ambulatory Visit: Payer: Self-pay

## 2021-07-15 VITALS — BP 110/60 | HR 92 | Ht 66.0 in | Wt 244.0 lb

## 2021-07-15 DIAGNOSIS — E890 Postprocedural hypothyroidism: Secondary | ICD-10-CM | POA: Diagnosis not present

## 2021-07-15 LAB — T4, FREE: Free T4: 1.22 ng/dL (ref 0.60–1.60)

## 2021-07-15 LAB — TSH: TSH: 1.24 u[IU]/mL (ref 0.35–5.50)

## 2021-07-15 NOTE — Progress Notes (Signed)
Subjective:    Patient ID: Anna Hawkins, female    DOB: 02-Sep-1976, 45 y.o.   MRN: [id number]  HPI Pt returns for f/u of post-RAI hypothyroidism (hyperthyroidism was dx'ed 2017; she had been noted to have a goiter in 2002; US showed features of both Grave's Dz and small multinodular goiter; she has had TL; she had RAI in 2017; she started synthroid afterwards; f/u US in 2019 showed goiter was smaller).  pt states she feels well in general.  Specifically, she denies palpitations and tremor.   Past Medical History:  Diagnosis Date   Asthma    Chlamydia    Diabetes mellitus    Fatty liver    Graves disease    History of shoulder dystocia in prior pregnancy    Obesity     Past Surgical History:  Procedure Laterality Date   LAPAROSCOPIC TUBAL LIGATION  10/29/2011   Procedure: LAPAROSCOPIC TUBAL LIGATION;  Surgeon: Mora Bellman, MD;  Location: [facility];  Service: Gynecology;  Laterality: Bilateral;   SLEEVE GASTROPLASTY     THERAPEUTIC ABORTION     WISDOM TOOTH EXTRACTION      Social History   Socioeconomic History   Marital status: Single    Spouse name: Not on file   Number of children: Not on file   Years of education: Not on file   Highest education level: Not on file  Occupational History   Not on file  Tobacco Use   Smoking status: Never   Smokeless tobacco: Never  Substance and Sexual Activity   Alcohol use: No   Drug use: No   Sexual activity: Yes    Birth control/protection: Surgical  Other Topics Concern   Not on file  Social History Narrative   Not on file   Social Determinants of Health   Financial Resource Strain: Not on file  Food Insecurity: Not on file  Transportation Needs: Not on file  Physical Activity: Not on file  Stress: Not on file  Social Connections: Not on file  Intimate Partner Violence: Not on file    Current Outpatient Medications on File Prior to Visit  Medication Sig Dispense Refill   albuterol (VENTOLIN HFA) 108 (90 Base)  MCG/ACT inhaler Inhale 1-2 puffs into the lungs every 4 (four) hours as needed for wheezing or shortness of breath. 18 g 4   blood glucose meter kit and supplies KIT Dispense based on patient and insurance preference. Use up to four times daily as directed. (FOR ICD-9 250.00, 250.01). 1 each 0   brompheniramine-pseudoephedrine-DM 30-2-10 MG/5ML syrup Take 5 mLs by mouth 4 (four) times daily as needed. 120 mL 0   cetirizine (ZYRTEC) 10 MG tablet Take 1 tablet (10 mg total) by mouth daily. 30 tablet 11   ferrous sulfate 325 (65 FE) MG tablet TAKE 3 TABLETS BY MOUTH 3 TIMES A DAY WITH MEALS 270 tablet 1   glucose blood test strip Use as instructed 100 each 12   Lancet Device MISC 1 Device by Does not apply route daily. 90 each 3   Lancets (ACCU-CHEK MULTICLIX) lancets Use as instructed 100 each 12   levothyroxine (SYNTHROID) 112 MCG tablet TAKE 1 TABLET BY MOUTH EVERY DAY BEFORE BREAKFAST 30 tablet 6   naproxen (NAPROSYN) 500 MG tablet Take 1 tablet (500 mg total) by mouth 2 (two) times daily. 30 tablet 0   ondansetron (ZOFRAN ODT) 4 MG disintegrating tablet Take 1 tablet (4 mg total) by mouth every 8 (eight) hours as needed  for nausea or vomiting. 20 tablet 0   [DISCONTINUED] hydrochlorothiazide (HYDRODIURIL) 25 MG tablet TAKE 1 TABLET BY MOUTH EVERY DAY 30 tablet 1   [DISCONTINUED] losartan (COZAAR) 100 MG tablet TAKE 1 TABLET BY MOUTH EVERY DAY 90 tablet 1   No current facility-administered medications on file prior to visit.    Allergies  Allergen Reactions   Lisinopril Rash and Cough    Family History  Problem Relation Age of Onset   Diabetes Mother    Hypertension Mother    Asthma Mother    Thyroid disease Mother    Diabetes Brother    Hypertension Brother    Asthma Brother    Diabetes Father    Cancer Father    Asthma Father    Hypertension Father     BP 110/60 (BP Location: Right Arm, Patient Position: Sitting, Cuff Size: Large)   Pulse 92   Ht _0  (1.676 m)   Wt 244 lb  (110.7 kg)   SpO2 99%   BMI 39.38 kg/m    Review of Systems     Objective:   Physical Exam VITAL SIGNS:  See vs page GENERAL: no distress NECK: thyroid is approx 2x normal size (R>L), but no palpable nodule  Lab Results  Component Value Date   TSH 1.24 07/15/2021      Assessment & Plan:  Hypothyroidism: well-controlled.  Please continue the same synthroid

## 2021-07-15 NOTE — Patient Instructions (Signed)
Thyroid blood tests are requested for you today.  We'll let you know about the results.  It is best to never miss the medication.  However, if you do miss it, next best is to double up the next time.  Please come back for a follow-up appointment in 1 year.

## 2021-07-29 ENCOUNTER — Other Ambulatory Visit: Payer: Self-pay | Admitting: Endocrinology

## 2021-10-17 ENCOUNTER — Ambulatory Visit
Admission: RE | Admit: 2021-10-17 | Discharge: 2021-10-17 | Disposition: A | Discharge status: Home / Self Care | Payer: 59 | Source: Ambulatory Visit | Attending: Family Medicine | Admitting: Family Medicine

## 2021-10-17 ENCOUNTER — Other Ambulatory Visit: Payer: Self-pay

## 2021-10-17 ENCOUNTER — Encounter: Payer: Self-pay | Admitting: Family Medicine

## 2021-10-17 ENCOUNTER — Ambulatory Visit (INDEPENDENT_AMBULATORY_CARE_PROVIDER_SITE_OTHER): Payer: 59 | Admitting: Family Medicine

## 2021-10-17 VITALS — BP 144/62 | HR 85 | Wt 247.0 lb

## 2021-10-17 DIAGNOSIS — M549 Dorsalgia, unspecified: Secondary | ICD-10-CM | POA: Diagnosis not present

## 2021-10-17 DIAGNOSIS — Z1159 Encounter for screening for other viral diseases: Secondary | ICD-10-CM

## 2021-10-17 DIAGNOSIS — N926 Irregular menstruation, unspecified: Secondary | ICD-10-CM

## 2021-10-17 DIAGNOSIS — E119 Type 2 diabetes mellitus without complications: Secondary | ICD-10-CM

## 2021-10-17 DIAGNOSIS — E890 Postprocedural hypothyroidism: Secondary | ICD-10-CM

## 2021-10-17 DIAGNOSIS — I10 Essential (primary) hypertension: Secondary | ICD-10-CM

## 2021-10-17 DIAGNOSIS — D500 Iron deficiency anemia secondary to blood loss (chronic): Secondary | ICD-10-CM

## 2021-10-17 DIAGNOSIS — G8929 Other chronic pain: Secondary | ICD-10-CM

## 2021-10-17 MED ORDER — LOSARTAN POTASSIUM 100 MG PO TABS: 100.0000 mg | ORAL_TABLET | Freq: Every day | ORAL | 1 refills | Status: DC

## 2021-10-17 NOTE — Assessment & Plan Note (Signed)
Previously thought to be due to irregular menses.  We will repeat CBC today.  She reports she does not have menses since November 2021.  We discussed that she is likely at the point of menopause by clinical criteria at this point.  We discussed that the diagnosis of menopause is clinical rather than by blood testing.  If she continues to be anemic I do recommend a colonoscopy as chronic menstrual blood loss would no longer be contributing to her anemia.  CBC today.

## 2021-10-17 NOTE — Patient Instructions (Signed)
It was wonderful to see you today.  Please bring ALL of your medications with you to every visit.   Today we talked about:  You can continue to use icy hot patches  An x-ray was ordered for you---you do not need an appointment to have this completed.  I recommend going to [place]   If the results are normal,I will send you a letter  I will call you with results if anything is abnormal      Thank you for choosing [facility].   Please call [phone] with any questions about today's appointment.  Please be sure to schedule follow up at the front  desk before you leave today.   Dorris Singh, MD  Family Medicine

## 2021-10-17 NOTE — Assessment & Plan Note (Signed)
She is taking her medication.  TSH ordered today

## 2021-10-17 NOTE — Progress Notes (Signed)
SUBJECTIVE:   CHIEF COMPLAINT: Back pain and right-sided numbness and tingling HPI:   Anna Hawkins is a 45 y.o. yo with history notable for obesity status post gastric sleeve, hypertension, type 2 diabetes and hypothyroidism presenting for right-sided back pain. The patient reports about a 2-week history of what she describes as numbness and slight itching in her back.  She reports this started as an itch in her mid back and now has progressed to a slightly numb feeling progressing to the right side.  She can trigger the pain by moving in certain directions.  The pain is not pleuritic.  She has no chest pain she has no dyspnea on exertion or lower extremity edema.  She denies any trauma.  She does work at [employer] as a Tourist information centre manager and spends long days sitting and standing and feels like she has a lot of stress at her job.  She does endorse some neck pain.  She has an IcyHot patch in place today.  She denies a painful rash wrapping around her side.  She denies dysuria or hematuria.  She has no history of venous thromboembolic disease. No rash, no vesicles present. No bowel or bladder symptoms/incontinence. No falls.   The patient reports she is due for a wellness exam in November.  She is currently taking levothyroxine, losartan and her allergy medication.   PERTINENT  PMH / PSH/Family/Social History : Surgical history is notable for gastric sleeve.  OBJECTIVE:   BP (!) 144/62   Pulse 85   Wt 247 lb (112 kg)   SpO2 99%   BMI 39.87 kg/m   Today's weight:  Last Weight  Most recent update: 10/17/2021 10:43 AM    Weight  112 kg (247 lb)            Review of prior weights: Filed Weights   10/17/21 1042  Weight: 247 lb (112 kg)    Well-appearing woman in no distress.  Neck exam is notable for some acanthosis nigra cans.  She does have a mild degree of kyphosis.  Positive Spurling's on the left.  She has tenderness to palpation along the right trapezius.  Along the thoracic  spine there is no tenderness to palpation there is an area of excoriation at the level of T1.  There is no rash.  There is no rash on the lateral chest wall.  She does have intertrigo in her intertriginous folds on the right and the left which is symmetric and there is no overlying vesicles.  Cardiac exam regular rate and rhythm no murmurs rubs or gallops.  Lungs clear bilaterally to auscultation. Biceps and brachial radialis reflexes are 2+.  Patellar reflexes 2+ negative Romberg.  ASSESSMENT/PLAN:   Hypothyroidism She is taking her medication.  TSH ordered today  Essential hypertension Near goal, she is only taking her losartan currently.  She was previously on HCTZ but reports she is no longer taking this.  Repeat BMP today.  Consider addition of amlodipine in the future.  Iron deficiency anemia due to chronic blood loss Previously thought to be due to irregular menses.  We will repeat CBC today.  She reports she does not have menses since November 2021.  We discussed that she is likely at the point of menopause by clinical criteria at this point.  We discussed that the diagnosis of menopause is clinical rather than by blood testing.  If she continues to be anemic I do recommend a colonoscopy as chronic menstrual blood loss  would no longer be contributing to her anemia.  CBC today.  Type 2 diabetes mellitus without complication (HCC) Status post a gastric bypass.  A1c today.  Could be contributing to symptoms of neuropathy and right foot.  She has close follow-up scheduled with her PCP to discuss further.  We will follow-up these labs.    Right-sided cervical/thoracic pain suspect this may be due to radicular pain or cervical spine degenerative disc disease.  There is no evidence of rash and no radiating pain suggestive of shingles.  Additionally given duration if it was shingles there should be a rash at this time.  There is no pleuritic component or dyspnea associated with this which makes me  much less suspicious for pulmonary embolism.  Will obtain C-spine images.  Continue supportive care at this time recommended Tylenol for pain given her history of gastric sleeve.  Given neuropathic pain type and history of gastric sleeve will obtain B12 level.  She is to follow-up with the remainder of her healthcare maintenance items with her primary care physician in November.  Hepatitis C was ordered today.   Dorris Singh, [place]

## 2021-10-17 NOTE — Assessment & Plan Note (Signed)
Status post a gastric bypass.  A1c today.  Could be contributing to symptoms of neuropathy and right foot.  She has close follow-up scheduled with her PCP to discuss further.  We will follow-up these labs.

## 2021-10-17 NOTE — Assessment & Plan Note (Signed)
Near goal, she is only taking her losartan currently.  She was previously on HCTZ but reports she is no longer taking this.  Repeat BMP today.  Consider addition of amlodipine in the future.

## 2021-10-18 ENCOUNTER — Telehealth: Payer: Self-pay | Admitting: Family Medicine

## 2021-10-18 DIAGNOSIS — D649 Anemia, unspecified: Secondary | ICD-10-CM

## 2021-10-18 DIAGNOSIS — D500 Iron deficiency anemia secondary to blood loss (chronic): Secondary | ICD-10-CM

## 2021-10-18 NOTE — Telephone Encounter (Signed)
Called patient upon receipt of labs showing Hemoglobin 7.0. As she has not had a menses in >11 months, suspect this is due to GI loss in setting of gastric sleeve.   Left generic voicemail to call back. Nursing-when/if she calls back please let her know blood count low. I recommend starting ferrous sulfate 325 mg daily. This is OTC. Can cause constipation. I also recommend referral to Gastroenterology to discuss a possible study to check her sleeve/colonoscopy.   Will send via Mychart as well.  At follow up, recommend repeat with iron studies.   Remainder of labs normal, awaiting A1C.  Dorris Singh, MD  Family Medicine Teaching Service

## 2021-10-20 LAB — HCV AB W REFLEX TO QUANT PCR: HCV Ab: <0.1 s/co ratio (ref 0.0–0.9)

## 2021-10-20 LAB — CBC
Hematocrit: 26.5 % — ABNORMAL LOW (ref 34.0–46.6)
Hemoglobin: 7 g/dL — CL (ref 11.1–15.9)
MCH: 16 pg — ABNORMAL LOW (ref 26.6–33.0)
MCHC: 26.4 g/dL — ABNORMAL LOW (ref 31.5–35.7)
MCV: 61 fL — ABNORMAL LOW (ref 79–97)
Platelets: 376 10*3/uL (ref 150–450)
RBC: 4.38 x10E6/uL (ref 3.77–5.28)
RDW: 19.5 % — ABNORMAL HIGH (ref 11.7–15.4)
WBC: 6.4 10*3/uL (ref 3.4–10.8)

## 2021-10-20 LAB — TSH: TSH: 0.617 u[IU]/mL (ref 0.450–4.500)

## 2021-10-20 LAB — VITAMIN B12: Vitamin B-12: 707 pg/mL (ref 232–1245)

## 2021-10-20 LAB — HEMOGLOBIN A1C
Est. average glucose Bld gHb Est-mCnc: 105 mg/dL
Hgb A1c MFr Bld: 5.3 % (ref 4.8–5.6)

## 2021-10-20 LAB — BASIC METABOLIC PANEL
BUN/Creatinine Ratio: 16 (ref 9–23)
BUN: 12 mg/dL (ref 6–24)
CO2: 23 mmol/L (ref 20–29)
Calcium: 9.6 mg/dL (ref 8.7–10.2)
Chloride: 104 mmol/L (ref 96–106)
Creatinine, Ser: 0.75 mg/dL (ref 0.57–1.00)
Glucose: 82 mg/dL (ref 70–99)
Potassium: 4 mmol/L (ref 3.5–5.2)
Sodium: 141 mmol/L (ref 134–144)
eGFR: 99 mL/min/{1.73_m2} (ref >59)

## 2021-10-20 LAB — HCV INTERPRETATION

## 2021-10-20 NOTE — Telephone Encounter (Signed)
Called with results--A1c still pending. Discussed below, start iron, repeat with iron studies at follow up. Referral to GI  If Hgb not improved and iron low at follow up, recommend consideration of IV iron.  Dorris Singh, MD  Family Medicine Teaching Service

## 2021-10-20 NOTE — Addendum Note (Signed)
Addended by: Owens Shark, Xitlalli Newhard on: 10/20/2021 08:37 AM   Modules accepted: Orders

## 2021-10-21 ENCOUNTER — Encounter: Payer: Self-pay | Admitting: Gastroenterology

## 2021-10-29 ENCOUNTER — Encounter: Payer: Self-pay | Admitting: Family Medicine

## 2021-10-29 ENCOUNTER — Ambulatory Visit (INDEPENDENT_AMBULATORY_CARE_PROVIDER_SITE_OTHER): Payer: 59 | Admitting: Family Medicine

## 2021-10-29 ENCOUNTER — Other Ambulatory Visit: Payer: Self-pay

## 2021-10-29 VITALS — BP 127/66 | Temp 97.9°F | Wt 240.2 lb

## 2021-10-29 DIAGNOSIS — D500 Iron deficiency anemia secondary to blood loss (chronic): Secondary | ICD-10-CM | POA: Diagnosis not present

## 2021-10-29 DIAGNOSIS — Z1231 Encounter for screening mammogram for malignant neoplasm of breast: Secondary | ICD-10-CM

## 2021-10-29 DIAGNOSIS — J069 Acute upper respiratory infection, unspecified: Secondary | ICD-10-CM | POA: Diagnosis not present

## 2021-10-29 DIAGNOSIS — Z124 Encounter for screening for malignant neoplasm of cervix: Secondary | ICD-10-CM | POA: Diagnosis not present

## 2021-10-29 DIAGNOSIS — D509 Iron deficiency anemia, unspecified: Secondary | ICD-10-CM | POA: Diagnosis not present

## 2021-10-29 NOTE — Progress Notes (Signed)
    SUBJECTIVE:   CHIEF COMPLAINT / HPI: itchy throat  Wants to delay PAP   Reports itchy throat started today.  Vomited x2, NBNB emesis this morning.  Reports dry cough.  Denies any fevers, chest pain, shortness of breath or abdominal pain.  Had chills and 3 non bloody loose stool.  Decreased appetite today but able to tolerate fluids well.  No recent sick contacts.  Has not had period in 1 year.  Denies any urinary symptoms.  Recently started iron pills 2 weeks age, taking three times a day.  Awaiting GI referral next month.  PERTINENT  PMH / PSH:  Allergic Rhinitis Obesity class 2  OBJECTIVE:   BP 127/66   Temp 97.9 F (36.6 C)   Wt 240 lb 3.2 oz (109 kg)   SpO2 100%   BMI 38.77 kg/m    Physical Exam:  General: 45 y.o. female in NAD Cardio: RRR no m/r/g Lungs: CTAB, no wheezing, no rhonchi, no crackles, no IWOB on room air Abdomen: Soft, non-tender to palpation, non-distended, positive bowel sounds Skin: warm and dry Extremities: No edema    ASSESSMENT/PLAN:   Viral URI with cough Given diarrhea and chills likely viral etiology. Symptomatic management Strict return precautions provided Reschedule appointment for PAP Follow up with PCP as needed   Iron deficiency anemia due to chronic blood loss Has not had menses in > 1 year.  No signs of bleeding today.  Compliant with iron supplements TID Mammogram ordered Hbg and Ferritin today Scheduled to see GI in Dec Strict return precautions     Carollee Leitz, MD [place]

## 2021-10-29 NOTE — Patient Instructions (Addendum)
Thank you for coming to see me today. It was a pleasure.   We will get some labs today.  If they are abnormal or we need to do something about them, I will call you.  If they are normal, I will send you a message on MyChart (if it is active) or a letter in the mail.  If you don't hear from [place] in 2 weeks, please call the office at the number below.    I have placed an order for your mammogram.  Please call [facility] at [phone] to schedule your appointment within one week.   If symptoms worsen please let me know  Please follow-up with PCP as needed  If you have any questions or concerns, please do not hesitate to call the office at [phone].  Best,   Carollee Leitz, MD

## 2021-10-30 LAB — LIPID PANEL
Chol/HDL Ratio: 1.8 ratio (ref 0.0–4.4)
Cholesterol, Total: 143 mg/dL (ref 100–199)
HDL: 79 mg/dL (ref >39)
LDL Chol Calc (NIH): 48 mg/dL (ref 0–99)
Triglycerides: 83 mg/dL (ref 0–149)
VLDL Cholesterol Cal: 16 mg/dL (ref 5–40)

## 2021-10-30 LAB — FERRITIN: Ferritin: 99 ng/mL (ref 15–150)

## 2021-11-04 ENCOUNTER — Encounter: Payer: Self-pay | Admitting: Family Medicine

## 2021-11-04 DIAGNOSIS — J069 Acute upper respiratory infection, unspecified: Secondary | ICD-10-CM | POA: Insufficient documentation

## 2021-11-04 NOTE — Assessment & Plan Note (Signed)
Has not had menses in > 1 year.  No signs of bleeding today.  Compliant with iron supplements TID Mammogram ordered Hbg and Ferritin today Scheduled to see GI in Dec Strict return precautions

## 2021-11-04 NOTE — Assessment & Plan Note (Signed)
Given diarrhea and chills likely viral etiology. Symptomatic management Strict return precautions provided Reschedule appointment for PAP Follow up with PCP as needed

## 2021-11-21 ENCOUNTER — Ambulatory Visit (INDEPENDENT_AMBULATORY_CARE_PROVIDER_SITE_OTHER): Payer: 59 | Admitting: Gastroenterology

## 2021-11-21 ENCOUNTER — Other Ambulatory Visit (INDEPENDENT_AMBULATORY_CARE_PROVIDER_SITE_OTHER): Payer: 59

## 2021-11-21 ENCOUNTER — Encounter: Payer: Self-pay | Admitting: Gastroenterology

## 2021-11-21 ENCOUNTER — Other Ambulatory Visit: Payer: Self-pay

## 2021-11-21 VITALS — BP 162/94 | Ht 64.0 in | Wt 241.1 lb

## 2021-11-21 DIAGNOSIS — Z1211 Encounter for screening for malignant neoplasm of colon: Secondary | ICD-10-CM | POA: Diagnosis not present

## 2021-11-21 DIAGNOSIS — D509 Iron deficiency anemia, unspecified: Secondary | ICD-10-CM

## 2021-11-21 DIAGNOSIS — K219 Gastro-esophageal reflux disease without esophagitis: Secondary | ICD-10-CM

## 2021-11-21 LAB — COMPREHENSIVE METABOLIC PANEL
ALT: 10 U/L (ref 0–35)
AST: 16 U/L (ref 0–37)
Albumin: 4.2 g/dL (ref 3.5–5.2)
Alkaline Phosphatase: 93 U/L (ref 39–117)
BUN: 8 mg/dL (ref 6–23)
CO2: 26 mEq/L (ref 19–32)
Calcium: 9.6 mg/dL (ref 8.4–10.5)
Chloride: 102 mEq/L (ref 96–112)
Creatinine, Ser: 0.65 mg/dL (ref 0.40–1.20)
GFR: 105.68 mL/min (ref >60.00)
Glucose, Bld: 69 mg/dL — ABNORMAL LOW (ref 70–99)
Potassium: 4 mEq/L (ref 3.5–5.1)
Sodium: 137 mEq/L (ref 135–145)
Total Bilirubin: 1 mg/dL (ref 0.2–1.2)
Total Protein: 8 g/dL (ref 6.0–8.3)

## 2021-11-21 LAB — CBC WITH DIFFERENTIAL/PLATELET
Basophils Absolute: 0 10*3/uL (ref 0.0–0.1)
Basophils Relative: 0.4 % (ref 0.0–3.0)
Eosinophils Absolute: 0.3 10*3/uL (ref 0.0–0.7)
Eosinophils Relative: 4.9 % (ref 0.0–5.0)
HCT: 31.6 % — ABNORMAL LOW (ref 36.0–46.0)
Hemoglobin: 9.3 g/dL — ABNORMAL LOW (ref 12.0–15.0)
Lymphocytes Relative: 47.1 % — ABNORMAL HIGH (ref 12.0–46.0)
Lymphs Abs: 2.8 10*3/uL (ref 0.7–4.0)
MCHC: 29.3 g/dL — ABNORMAL LOW (ref 30.0–36.0)
MCV: 63.7 fl — ABNORMAL LOW (ref 78.0–100.0)
Monocytes Absolute: 0.5 10*3/uL (ref 0.1–1.0)
Monocytes Relative: 7.7 % (ref 3.0–12.0)
Neutro Abs: 2.4 10*3/uL (ref 1.4–7.7)
Neutrophils Relative %: 39.9 % — ABNORMAL LOW (ref 43.0–77.0)
Platelets: 300 10*3/uL (ref 150.0–400.0)
RBC: 4.96 Mil/uL (ref 3.87–5.11)
RDW: 26.8 % — ABNORMAL HIGH (ref 11.5–15.5)
WBC: 6 10*3/uL (ref 4.0–10.5)

## 2021-11-21 MED ORDER — PANTOPRAZOLE SODIUM 40 MG PO TBEC: 40.0000 mg | DELAYED_RELEASE_TABLET | Freq: Every day | ORAL | 3 refills | Status: DC

## 2021-11-21 NOTE — Progress Notes (Signed)
Chief Complaint: IDA  Referring Provider:  Carollee Leitz, MD      ASSESSMENT AND PLAN;   #1. GERD with ?[place]  #2. IDA  Plan: -Protonix 35m po qd #90 -Hemoccult stools x 3 -Hematology consult for IDA -EGD/colon for further evaluation. -If still with problems, CTA followed by [facility] -CBC, CMP today. -Continue iron supplements for now. -D/W pt and pt's husband.   I discussed EGD/Colonoscopy- the indications, risks, alternatives and potential complications including, but not limited to, bleeding, infection, reaction to medication, damage to internal organs, cardiac and/or pulmonary problems, and perforation requiring surgery. The possibility that significant findings could be missed was explained. All ? were answered. The patient gives consent to proceed. HPI:    Anna BARKEYis a 45y.o. female  With morbid obesity s/p gastric sleeve 2019 (lost 90lb since), diet-controlled DM, hypothyroidism, HTN, asthma  With IDA Hb 7.0 10/28, on iron. No melena or hematochezia.  No menstrual cycles x 1 year.  Has been craving ice.  Longstanding H/O heartburn with every meal x yrs. Has been told previously that she has hiatal hernia (on UGI series several years ago, did not have EGD prior to gastric sleeve). No odynophagia or dysphagia. Has N/V 3 times a week specially when she overeats.  Always had diarrhea every other day even before gastric sleeve surgery in 2019.  Some abdominal bloating.  Most prominently after eating.  No nocturnal symptoms.  No abdominal pain.  Has never seen hematology.  Never had an EGD/colonoscopy.  No sodas, chocolates, chewing gums, artificial sweeteners and candy. No NSAIDs  CBC Latest Ref Rng & Units 11/21/2021 10/17/2021 04/10/2020  WBC 4.0 - 10.5 K/uL 6.0 6.4 6.9  Hemoglobin 12.0 - 15.0 g/dL 9.3 Repeated and verified X2.(L) 7.0(LL) 7.8(L)  Hematocrit 36.0 - 46.0 % 31.6 Repeated and verified X2.(L) 26.5(L) 27.9(L)  Platelets 150.0 - 400.0 K/uL 300.0 376  469(H)     SH-married, sEducation officer, museumfor [employer]  Has 3 girls. Past Medical History:  Diagnosis Date  . Asthma   . Chlamydia   . Diabetes mellitus   . Fatty liver   . Graves disease   . History of shoulder dystocia in prior pregnancy   . Obesity     Past Surgical History:  Procedure Laterality Date  . LAPAROSCOPIC TUBAL LIGATION  10/29/2011   Procedure: LAPAROSCOPIC TUBAL LIGATION;  Surgeon: PMora Bellman MD;  [facility];  Service: Gynecology;  Laterality: Bilateral;  . SLEEVE GASTROPLASTY    . THERAPEUTIC ABORTION    . WISDOM TOOTH EXTRACTION      Family History  Problem Relation Age of Onset  . Diabetes Mother   . Hypertension Mother   . Asthma Mother   . Thyroid disease Mother   . Diabetes Brother   . Hypertension Brother   . Asthma Brother   . Diabetes Father   . Cancer Father   . Asthma Father   . Hypertension Father     Social History   Tobacco Use  . Smoking status: Never  . Smokeless tobacco: Never  Substance Use Topics  . Alcohol use: No  . Drug use: No    Current Outpatient Medications  Medication Sig Dispense Refill  . albuterol (VENTOLIN HFA) 108 (90 Base) MCG/ACT inhaler Inhale 1-2 puffs into the lungs every 4 (four) hours as needed for wheezing or shortness of breath. 18 g 4  . blood glucose meter kit and supplies KIT Dispense based on patient and insurance preference.  Use up to four times daily as directed. (FOR ICD-9 250.00, 250.01). 1 each 0  . cetirizine (ZYRTEC) 10 MG tablet Take 1 tablet (10 mg total) by mouth daily. 30 tablet 11  . Lancet Device MISC 1 Device by Does not apply route daily. 90 each 3  . Lancets (ACCU-CHEK MULTICLIX) lancets Use as instructed 100 each 12  . levothyroxine (SYNTHROID) 112 MCG tablet TAKE 1 TABLET BY MOUTH EVERY DAY BEFORE BREAKFAST 30 tablet 6  . ondansetron (ZOFRAN ODT) 4 MG disintegrating tablet Take 1 tablet (4 mg total) by mouth every 8 (eight) hours as needed for nausea or vomiting. 20 tablet 0   . glucose blood test strip Use as instructed 100 each 12   No current facility-administered medications for this visit.    Allergies  Allergen Reactions  . Lisinopril Rash and Cough    Review of Systems:  Constitutional: Denies fever, chills, diaphoresis, appetite change and fatigue.  HEENT: Has allergies Respiratory: Denies SOB, DOE, cough, chest tightness,  and wheezing.   Cardiovascular: Denies chest pain, palpitations and leg swelling.  Genitourinary: Denies dysuria, urgency, frequency, hematuria, flank pain and difficulty urinating.  Musculoskeletal: Denies myalgias, has back pain, joint swelling, arthralgias and gait problem.  Skin: No rash.  Neurological: Denies dizziness, seizures, syncope, weakness, light-headedness, numbness and headaches.  Hematological: Denies adenopathy. Easy bruising, personal or family bleeding history  Psychiatric/Behavioral: No anxiety or depression     Physical Exam:    BP (!) 162/94 (BP Location: Right Arm, Patient Position: Sitting, Cuff Size: Normal)   Ht _0  (1.626 m)   Wt 241 lb 2 oz (109.4 kg)   BMI 41.39 kg/m  Wt Readings from Last 3 Encounters:  11/21/21 241 lb 2 oz (109.4 kg)  10/29/21 240 lb 3.2 oz (109 kg)  10/17/21 247 lb (112 kg)   Constitutional:  Well-developed, in no acute distress. Psychiatric: Normal mood and affect. Behavior is normal. HEENT: Pupils normal.  Conjunctivae are normal. No scleral icterus. Neck supple.  Cardiovascular: Normal rate, regular rhythm. No edema Pulmonary/chest: Effort normal and breath sounds normal. No wheezing, rales or rhonchi. Abdominal: Soft, nondistended. Nontender. Bowel sounds active throughout. There are no masses palpable. No hepatomegaly. Rectal: To be performed at the time of colonoscopy Neurological: Alert and oriented to person place and time. Skin: Skin is warm and dry. No rashes noted.  Data Reviewed: I have personally reviewed following labs and imaging  studies  CBC: CBC Latest Ref Rng & Units 10/17/2021 04/10/2020 07/12/2018  WBC 3.4 - 10.8 x10E3/uL 6.4 6.9 10.8  Hemoglobin 11.1 - 15.9 g/dL 7.0(LL) 7.8(L) 10.7(L)  Hematocrit 34.0 - 46.6 % 26.5(L) 27.9(L) 33.9(L)  Platelets 150 - 450 x10E3/uL 376 469(H) 369    CMP: CMP Latest Ref Rng & Units 10/17/2021 04/10/2020 09/28/2016  Glucose 70 - 99 mg/dL 82 84 61(L)  BUN 6 - 24 mg/dL _1 Creatinine 0.57 - 1.00 mg/dL 0.75 0.66 0.74  Sodium 134 - 144 mmol/L 141 140 140  Potassium 3.5 - 5.2 mmol/L 4.0 3.5 3.3(L)  Chloride 96 - 106 mmol/L 104 105 109  CO2 20 - 29 mmol/L 23 19(L) 22  Calcium 8.7 - 10.2 mg/dL 9.6 9.0 9.5  Total Protein 6.5 - 8.1 g/dL - - -  Total Bilirubin 0.3 - 1.2 mg/dL - - -  Alkaline Phos 38 - 126 U/L - - -  AST 15 - 41 U/L - - -  ALT 14 - 54 U/L - - -  Carmell Austria, MD 11/21/2021, 10:57 AM  Cc: Carollee Leitz, MD

## 2021-11-21 NOTE — Patient Instructions (Addendum)
If you are age 46 or older, your body mass index should be between 23-30. Your Body mass index is 41.39 kg/m. If this is out of the aforementioned range listed, please consider follow up with your Primary Care Provider.  If you are age 5 or younger, your body mass index should be between 19-25. Your Body mass index is 41.39 kg/m. If this is out of the aformentioned range listed, please consider follow up with your Primary Care Provider.   ________________________________________________________  The [facility] providers would like to encourage you to use [facility] to communicate with providers for non-urgent requests or questions.  Due to long hold times on the telephone, sending your provider a message by [facility] may be a faster and more efficient way to get a response.  Please allow 48 business hours for a response.  Please remember that this is for non-urgent requests.  _______________________________________________________  Dennis Bast have been scheduled for an endoscopy and colonoscopy. Please follow the written instructions given to you at your visit today. Please pick up your prep supplies at the pharmacy within the next 1-3 days. If you use inhalers (even only as needed), please bring them with you on the day of your procedure.  Please go to the lab on the 2nd floor [address] before you leave the office today.   We have sent the following medications to your pharmacy for you to pick up at your convenience: Protonix  01-17-2022  Two days before your procedure: Mix 3 packs (or capfuls) of Miralax in 48 ounces of clear liquid and drink at 6pm.   Please call with any questions or concerns.  Thank you,  Dr. Jackquline Denmark

## 2021-11-25 ENCOUNTER — Inpatient Hospital Stay (HOSPITAL_BASED_OUTPATIENT_CLINIC_OR_DEPARTMENT_OTHER): Payer: 59 | Admitting: Family

## 2021-11-25 ENCOUNTER — Inpatient Hospital Stay: Payer: 59 | Attending: Gastroenterology

## 2021-11-25 ENCOUNTER — Encounter: Payer: Self-pay | Admitting: Family

## 2021-11-25 ENCOUNTER — Other Ambulatory Visit: Payer: Self-pay | Admitting: Family

## 2021-11-25 ENCOUNTER — Other Ambulatory Visit: Payer: Self-pay

## 2021-11-25 VITALS — BP 139/63 | HR 73 | Temp 98.2°F | Resp 18 | Ht 64.0 in | Wt 240.0 lb

## 2021-11-25 DIAGNOSIS — D509 Iron deficiency anemia, unspecified: Secondary | ICD-10-CM | POA: Insufficient documentation

## 2021-11-25 DIAGNOSIS — Z9884 Bariatric surgery status: Secondary | ICD-10-CM | POA: Diagnosis not present

## 2021-11-25 DIAGNOSIS — D649 Anemia, unspecified: Secondary | ICD-10-CM

## 2021-11-25 LAB — CBC WITH DIFFERENTIAL (CANCER CENTER ONLY)
Abs Immature Granulocytes: 0.01 10*3/uL (ref 0.00–0.07)
Basophils Absolute: 0.1 10*3/uL (ref 0.0–0.1)
Basophils Relative: 1 %
Eosinophils Absolute: 0.3 10*3/uL (ref 0.0–0.5)
Eosinophils Relative: 6 %
HCT: 32.1 % — ABNORMAL LOW (ref 36.0–46.0)
Hemoglobin: 8.9 g/dL — ABNORMAL LOW (ref 12.0–15.0)
Immature Granulocytes: 0 %
Lymphocytes Relative: 47 %
Lymphs Abs: 2.6 10*3/uL (ref 0.7–4.0)
MCH: 18.5 pg — ABNORMAL LOW (ref 26.0–34.0)
MCHC: 27.7 g/dL — ABNORMAL LOW (ref 30.0–36.0)
MCV: 66.7 fL — ABNORMAL LOW (ref 80.0–100.0)
Monocytes Absolute: 0.4 10*3/uL (ref 0.1–1.0)
Monocytes Relative: 7 %
Neutro Abs: 2.2 10*3/uL (ref 1.7–7.7)
Neutrophils Relative %: 39 %
Platelet Count: 272 10*3/uL (ref 150–400)
RBC: 4.81 MIL/uL (ref 3.87–5.11)
RDW: 24.2 % — ABNORMAL HIGH (ref 11.5–15.5)
WBC Count: 5.5 10*3/uL (ref 4.0–10.5)
nRBC: 0 % (ref 0.0–0.2)

## 2021-11-25 LAB — SAMPLE TO BLOOD BANK

## 2021-11-25 LAB — CMP (CANCER CENTER ONLY)
ALT: 11 U/L (ref 0–44)
AST: 16 U/L (ref 15–41)
Albumin: 3.9 g/dL (ref 3.5–5.0)
Alkaline Phosphatase: 87 U/L (ref 38–126)
Anion gap: 7 (ref 5–15)
BUN: 13 mg/dL (ref 6–20)
CO2: 27 mmol/L (ref 22–32)
Calcium: 9.5 mg/dL (ref 8.9–10.3)
Chloride: 105 mmol/L (ref 98–111)
Creatinine: 0.7 mg/dL (ref 0.44–1.00)
GFR, Estimated: >60 mL/min (ref >60)
Glucose, Bld: 86 mg/dL (ref 70–99)
Potassium: 3.7 mmol/L (ref 3.5–5.1)
Sodium: 139 mmol/L (ref 135–145)
Total Bilirubin: 0.8 mg/dL (ref 0.3–1.2)
Total Protein: 7.6 g/dL (ref 6.5–8.1)

## 2021-11-25 LAB — RETICULOCYTES
Immature Retic Fract: 17.1 % — ABNORMAL HIGH (ref 2.3–15.9)
RBC.: 4.84 MIL/uL (ref 3.87–5.11)
Retic Count, Absolute: 24.7 10*3/uL (ref 19.0–186.0)
Retic Ct Pct: 0.5 % (ref 0.4–3.1)

## 2021-11-25 LAB — LACTATE DEHYDROGENASE: LDH: 148 U/L (ref 98–192)

## 2021-11-25 LAB — SAVE SMEAR(SSMR), FOR PROVIDER SLIDE REVIEW

## 2021-11-25 NOTE — Progress Notes (Signed)
Hematology/Oncology Consultation   Name: Anna Hawkins      MRN: [id number]    Location: Room/bed info not found  Date: 11/25/2021 Time:11:29 AM   REFERRING PHYSICIAN: Jackquline Denmark, MD  REASON FOR CONSULT: Iron deficiency anemia    DIAGNOSIS: Anemia panel pending  HISTORY OF PRESENT ILLNESS: Anna Hawkins is a very pleasant 45 yo [place] American female with an over 10 years history of intermittent anemia.  Hgb today is 8.9, MCV 66, platelets 272 and WBC count 8.9.  She had gastric sleeve surgery in October 2019.  She has not had a cycle in over a year. No blood loss noted. No bruising or petechiae.  She has orders in for hemoccult stool testing. Dr. Lyndel Safe also has her scheduled for EGD and colonoscopy on 01/19/2022.  She is symptomatic with fatigue, chewing lots of ice and numbness and tingling in her feet that comes and goes. No known family history of anemia or sickle cell disease/trait.  No personal or known familial history of cancer.  She has 3 children and history of 2 miscarriages within the early weeks of pregnancy.  She had gestational diabetes in the past but no diabetes at this time.  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 No fever, chills, n/v, cough, rash, dizziness, SOB, chest pain, palpitations, abdominal pain or changes in bowel or bladder habits.  No swelling or tenderness in her extremities.  No falls or syncope to report.  No smoking, ETOH or recreational drug use.  She has maintained a good appetite and is staying well hydrated. Her weight is stable at 240 lbs.  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   ROS: All other 10 point review of systems is negative.   PAST MEDICAL HISTORY:   Past Medical History:  Diagnosis Date   Asthma    Chlamydia    Diabetes mellitus    Fatty liver    Graves disease    History of shoulder dystocia in prior pregnancy    Obesity      ALLERGIES: Allergies  Allergen Reactions   Lisinopril Rash and Cough      MEDICATIONS:  Current Outpatient Medications on File Prior to Visit  Medication Sig Dispense Refill   albuterol (VENTOLIN HFA) 108 (90 Base) MCG/ACT inhaler Inhale 1-2 puffs into the lungs every 4 (four) hours as needed for wheezing or shortness of breath. 18 g 4   blood glucose meter kit and supplies KIT Dispense based on patient and insurance preference. Use up to four times daily as directed. (FOR ICD-9 250.00, 250.01). 1 each 0   cetirizine (ZYRTEC) 10 MG tablet Take 1 tablet (10 mg total) by mouth daily. 30 tablet 11   glucose blood test strip Use as instructed 100 each 12   Lancet Device MISC 1 Device by Does not apply route daily. 90 each 3   Lancets (ACCU-CHEK MULTICLIX) lancets Use as instructed 100 each 12   levothyroxine (SYNTHROID) 112 MCG tablet TAKE 1 TABLET BY MOUTH EVERY DAY BEFORE BREAKFAST 30 tablet 6   ondansetron (ZOFRAN ODT) 4 MG disintegrating tablet Take 1 tablet (4 mg total) by mouth every 8 (eight) hours as needed for nausea or vomiting. 20 tablet 0   pantoprazole (PROTONIX) 40 MG tablet Take 1 tablet (40 mg total) by mouth daily. 90 tablet 3   [DISCONTINUED] hydrochlorothiazide (HYDRODIURIL) 25 MG tablet TAKE 1 TABLET BY MOUTH EVERY DAY  30 tablet 1   No current facility-administered medications on file prior to visit.     PAST SURGICAL HISTORY Past Surgical History:  Procedure Laterality Date   LAPAROSCOPIC TUBAL LIGATION  10/29/2011   Procedure: LAPAROSCOPIC TUBAL LIGATION;  Surgeon: Mora Bellman, MD;  Location: [facility];  Service: Gynecology;  Laterality: Bilateral;   SLEEVE GASTROPLASTY     THERAPEUTIC ABORTION     WISDOM TOOTH EXTRACTION      FAMILY HISTORY: Family History  Problem Relation Age of Onset   Diabetes Mother    Hypertension Mother    Asthma Mother    Thyroid disease Mother    Diabetes Brother    Hypertension Brother    Asthma Brother    Diabetes Father     Cancer Father    Asthma Father    Hypertension Father     SOCIAL HISTORY:  reports that she has never smoked. She has never used smokeless tobacco. She reports that she does not drink alcohol and does not use drugs.  PERFORMANCE STATUS: The patient's performance status is 1 - Symptomatic but completely ambulatory  PHYSICAL EXAM: Most Recent Vital Signs: There were no vitals taken for this visit. BP 139/63 (BP Location: Left Arm, Patient Position: Sitting)   Pulse 73   Temp 98.2 F (36.8 C) (Oral)   Resp 18   Ht _0  (1.626 m)   Wt 240 lb (108.9 kg)   SpO2 100%   BMI 41.20 kg/m   General Appearance:    Alert, cooperative, no distress, appears stated age  Head:    Normocephalic, without obvious abnormality, atraumatic  Eyes:    PERRL, conjunctiva/corneas clear, EOM's intact, fundi    benign, both eyes        Throat:   Lips, mucosa, and tongue normal; teeth and gums normal  Neck:   Supple, symmetrical, trachea midline, no adenopathy;    thyroid:  no enlargement/tenderness/nodules; no carotid   bruit or JVD  Back:     Symmetric, no curvature, ROM normal, no CVA tenderness  Lungs:     Clear to auscultation bilaterally, respirations unlabored  Chest Wall:    No tenderness or deformity   Heart:    Regular rate and rhythm, S1 and S2 normal, no murmur, rub   or gallop     Abdomen:     Soft, non-tender, bowel sounds active all four quadrants,    no masses, no organomegaly        Extremities:   Extremities normal, atraumatic, no cyanosis or edema  Pulses:   2+ and symmetric all extremities  Skin:   Skin color, texture, turgor normal, no rashes or lesions  Lymph nodes:   Cervical, supraclavicular, and axillary nodes normal  Neurologic:   CNII-XII intact, normal strength, sensation and reflexes    throughout    LABORATORY DATA:  Results for orders placed or performed in visit on 11/25/21 (from the past 48 hour(s))  CBC with Differential (Cancer Center Only)     Status:  Abnormal   Collection Time: 11/25/21 11:08 AM  Result Value Ref Range   WBC Count 5.5 4.0 - 10.5 K/uL   RBC 4.81 3.87 - 5.11 MIL/uL   Hemoglobin 8.9 (L) 12.0 - 15.0 g/dL    Comment: Reticulocyte Hemoglobin testing may be clinically indicated, consider ordering this additional test [id number]    HCT 32.1 (L) 36.0 - 46.0 %   MCV 66.7 (L) 80.0 - 100.0 fL   MCH 18.5 (L) 26.0 - 34.0  pg   MCHC 27.7 (L) 30.0 - 36.0 g/dL   RDW 24.2 (H) 11.5 - 15.5 %   Platelet Count 272 150 - 400 K/uL   nRBC 0.0 0.0 - 0.2 %   Neutrophils Relative % 39 %   Neutro Abs 2.2 1.7 - 7.7 K/uL   Lymphocytes Relative 47 %   Lymphs Abs 2.6 0.7 - 4.0 K/uL   Monocytes Relative 7 %   Monocytes Absolute 0.4 0.1 - 1.0 K/uL   Eosinophils Relative 6 %   Eosinophils Absolute 0.3 0.0 - 0.5 K/uL   Basophils Relative 1 %   Basophils Absolute 0.1 0.0 - 0.1 K/uL   Immature Granulocytes 0 %   Abs Immature Granulocytes 0.01 0.00 - 0.07 K/uL    Comment: Performed at [facility] at [facility], [address] [place], [place] [address]  Save Smear [facility])     Status: None   Collection Time: 11/25/21 11:08 AM  Result Value Ref Range   Smear Review SMEAR STAINED AND AVAILABLE FOR REVIEW     Comment: Performed at [facility] at [facility] : [facility], [address], [place], [place] [address]  Reticulocytes     Status: Abnormal   Collection Time: 11/25/21 11:09 AM  Result Value Ref Range   Retic Ct Pct 0.5 0.4 - 3.1 %   RBC. 4.84 3.87 - 5.11 MIL/uL   Retic Count, Absolute 24.7 19.0 - 186.0 K/uL   Immature Retic Fract 17.1 (H) 2.3 - 15.9 %    Comment: Performed at [facility] at [facility], [address], [place],  [address]      RADIOGRAPHY: No results found.     PATHOLOGY: None  ASSESSMENT/PLAN: Ms. Nazir is a very pleasant 45 yo [place] American female with an over 10 years history of intermittent anemia.  She has history of gastric  sleeve surgery in 2019 and is scheduled for EGD and colonoscopy with Dr. Lyndel Safe.  Anemia panel is pending. We will get her set up for IV iron and/or folic acid if needed.  Follow-up in 6 weeks.   All questions were answered. The patient knows to call the clinic with any problems, questions or concerns. We can certainly see the patient much sooner if necessary.   Lottie Dawson, NP

## 2021-11-26 ENCOUNTER — Encounter: Payer: Self-pay | Admitting: Family

## 2021-11-26 ENCOUNTER — Telehealth: Payer: Self-pay | Admitting: *Deleted

## 2021-11-26 LAB — IRON AND TIBC
Iron: 166 ug/dL — ABNORMAL HIGH (ref 41–142)
Saturation Ratios: 34 % (ref 21–57)
TIBC: 490 ug/dL — ABNORMAL HIGH (ref 236–444)
UIBC: 324 ug/dL (ref 120–384)

## 2021-11-26 LAB — FERRITIN: Ferritin: <4 ng/mL — ABNORMAL LOW (ref 11–307)

## 2021-11-26 NOTE — Telephone Encounter (Signed)
Per 11/26/21 los - called and gave upcoming appointments - confirmed

## 2021-11-27 LAB — HGB FRACTIONATION CASCADE
Hgb A2: 1.6 % — ABNORMAL LOW (ref 1.8–3.2)
Hgb A: 98.4 % (ref 96.4–98.8)
Hgb F: 0 % (ref 0.0–2.0)
Hgb S: 0 %

## 2021-11-27 LAB — ERYTHROPOIETIN: Erythropoietin: 36.4 m[IU]/mL — ABNORMAL HIGH (ref 2.6–18.5)

## 2021-12-04 LAB — ALPHA-THALASSEMIA GENOTYPR

## 2021-12-08 ENCOUNTER — Other Ambulatory Visit: Payer: Self-pay | Admitting: Family

## 2021-12-08 ENCOUNTER — Encounter: Payer: Self-pay | Admitting: *Deleted

## 2021-12-08 DIAGNOSIS — D563 Thalassemia minor: Secondary | ICD-10-CM

## 2021-12-08 MED ORDER — FOLIC ACID 1 MG PO TABS: 1.0000 mg | ORAL_TABLET | Freq: Every day | ORAL | 11 refills | Status: AC

## 2022-01-05 ENCOUNTER — Encounter: Payer: Self-pay | Admitting: Gastroenterology

## 2022-01-05 ENCOUNTER — Other Ambulatory Visit: Payer: Self-pay | Admitting: Family

## 2022-01-05 DIAGNOSIS — D509 Iron deficiency anemia, unspecified: Secondary | ICD-10-CM

## 2022-01-05 DIAGNOSIS — D563 Thalassemia minor: Secondary | ICD-10-CM

## 2022-01-06 ENCOUNTER — Encounter: Payer: Self-pay | Admitting: Family

## 2022-01-06 ENCOUNTER — Other Ambulatory Visit: Payer: Self-pay

## 2022-01-06 ENCOUNTER — Telehealth: Payer: Self-pay | Admitting: *Deleted

## 2022-01-06 ENCOUNTER — Inpatient Hospital Stay (HOSPITAL_BASED_OUTPATIENT_CLINIC_OR_DEPARTMENT_OTHER): Payer: 59 | Admitting: Family

## 2022-01-06 ENCOUNTER — Inpatient Hospital Stay: Payer: 59 | Attending: Gastroenterology

## 2022-01-06 VITALS — BP 139/63 | HR 73 | Temp 98.1°F | Resp 18 | Wt 247.8 lb

## 2022-01-06 DIAGNOSIS — D563 Thalassemia minor: Secondary | ICD-10-CM | POA: Diagnosis not present

## 2022-01-06 DIAGNOSIS — D508 Other iron deficiency anemias: Secondary | ICD-10-CM | POA: Diagnosis present

## 2022-01-06 DIAGNOSIS — D509 Iron deficiency anemia, unspecified: Secondary | ICD-10-CM | POA: Diagnosis not present

## 2022-01-06 DIAGNOSIS — K909 Intestinal malabsorption, unspecified: Secondary | ICD-10-CM | POA: Insufficient documentation

## 2022-01-06 LAB — CBC WITH DIFFERENTIAL (CANCER CENTER ONLY)
Abs Immature Granulocytes: 0.02 10*3/uL (ref 0.00–0.07)
Basophils Absolute: 0.1 10*3/uL (ref 0.0–0.1)
Basophils Relative: 1 %
Eosinophils Absolute: 0.4 10*3/uL (ref 0.0–0.5)
Eosinophils Relative: 6 %
HCT: 33.2 % — ABNORMAL LOW (ref 36.0–46.0)
Hemoglobin: 9.5 g/dL — ABNORMAL LOW (ref 12.0–15.0)
Immature Granulocytes: 0 %
Lymphocytes Relative: 45 %
Lymphs Abs: 3 10*3/uL (ref 0.7–4.0)
MCH: 20.3 pg — ABNORMAL LOW (ref 26.0–34.0)
MCHC: 28.6 g/dL — ABNORMAL LOW (ref 30.0–36.0)
MCV: 70.8 fL — ABNORMAL LOW (ref 80.0–100.0)
Monocytes Absolute: 0.4 10*3/uL (ref 0.1–1.0)
Monocytes Relative: 5 %
Neutro Abs: 3 10*3/uL (ref 1.7–7.7)
Neutrophils Relative %: 43 %
Platelet Count: 220 10*3/uL (ref 150–400)
RBC: 4.69 MIL/uL (ref 3.87–5.11)
RDW: 22.1 % — ABNORMAL HIGH (ref 11.5–15.5)
WBC Count: 6.8 10*3/uL (ref 4.0–10.5)
nRBC: 0 % (ref 0.0–0.2)

## 2022-01-06 LAB — IRON AND IRON BINDING CAPACITY (CC-WL,HP ONLY)
Iron: 26 ug/dL — ABNORMAL LOW (ref 28–170)
Saturation Ratios: 5 % — ABNORMAL LOW (ref 10.4–31.8)
TIBC: 528 ug/dL — ABNORMAL HIGH (ref 250–450)
UIBC: 502 ug/dL — ABNORMAL HIGH (ref 148–442)

## 2022-01-06 LAB — RETICULOCYTES
Immature Retic Fract: 18.6 % — ABNORMAL HIGH (ref 2.3–15.9)
RBC.: 4.74 MIL/uL (ref 3.87–5.11)
Retic Count, Absolute: 73.9 10*3/uL (ref 19.0–186.0)
Retic Ct Pct: 1.6 % (ref 0.4–3.1)

## 2022-01-06 NOTE — Progress Notes (Signed)
Hematology and Oncology Follow Up Visit  Anna Hawkins [id number] [birth year] 46 y.o. 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 She is doing well and notes that her energy seems to be improved.  She had her cycle last month for the first time in 1 year. She states that for the last 3 years she has had this happen.  No other blood loss noted. No bruising or petechiae.  No fever, chills, n/v, cough, rash, dizziness, SOB, chest pain, palpitations, abdominal pain or changes in bowel or bladder habits.  No swelling, tenderness, numbness or tingling in her extremities.  No falls or syncope to report.  She has maintained a good appetite and is staying well hydrated. Her weight is 247 lbs.   ECOG Performance Status: 1 - Symptomatic but completely ambulatory  Medications:  Allergies as of 01/06/2022       Reactions   Lisinopril Rash, Cough        Medication List        Accurate as of January 06, 2022  1:57 PM. If you have any questions, ask your nurse or doctor.          accu-chek multiclix lancets Use as instructed   albuterol 108 (90 Base) MCG/ACT inhaler Commonly known as: VENTOLIN HFA Inhale 1-2 puffs into the lungs every 4 (four) hours as needed for wheezing or shortness of breath.   blood glucose meter kit and supplies Kit Dispense based on patient and insurance preference. Use up to four times daily as directed. (FOR ICD-9 250.00, 250.01).   cetirizine 10 MG tablet Commonly known as: ZYRTEC Take 1 tablet (10 mg total) by mouth daily.   folic acid 1 MG tablet Commonly known as: FOLVITE Take 1 tablet (1 mg total) by mouth daily.   glucose blood test strip Use as instructed   Lancet Device Misc 1 Device by Does not apply route  daily.   levothyroxine 112 MCG tablet Commonly known as: SYNTHROID TAKE 1 TABLET BY MOUTH EVERY DAY BEFORE BREAKFAST   ondansetron 4 MG disintegrating tablet Commonly known as: Zofran ODT Take 1 tablet (4 mg total) by mouth every 8 (eight) hours as needed for nausea or vomiting.   pantoprazole 40 MG tablet Commonly known as: PROTONIX Take 1 tablet (40 mg total) by mouth daily.        Allergies:  Allergies  Allergen Reactions   Lisinopril Rash and Cough    Past Medical History, Surgical history, Social history, and Family History were reviewed and updated.  Review of Systems: All other 10 point review of systems is negative.   Physical Exam:  weight is 247 lb 12.8 oz (112.4 kg). Her oral temperature is 98.1 F (36.7 C). Her blood pressure is 139/63 and her pulse is 73. Her respiration is 18 and oxygen saturation is 100%.   Wt Readings from Last 3 Encounters:  01/06/22 247 lb 12.8 oz (112.4 kg)  11/25/21 240 lb (108.9 kg)  11/21/21 241 lb 2 oz (109.4 kg)    Ocular: Sclerae unicteric, pupils equal, round and reactive to light Ear-nose-throat: Oropharynx clear, dentition fair Lymphatic: No cervical or supraclavicular adenopathy Lungs no rales or rhonchi, good excursion bilaterally Heart regular rate and rhythm, no murmur appreciated Abd soft, nontender, positive bowel  sounds MSK no focal spinal tenderness, no joint edema Neuro: non-focal, well-oriented, appropriate affect Breasts: Deferred   Lab Results  Component Value Date   WBC 6.8 01/06/2022   HGB 9.5 (L) 01/06/2022   HCT 33.2 (L) 01/06/2022   MCV 70.8 (L) 01/06/2022   PLT 220 01/06/2022   Lab Results  Component Value Date   FERRITIN <4 (L) 11/25/2021   IRON 166 (H) 11/25/2021   TIBC 490 (H) 11/25/2021   UIBC 324 11/25/2021   IRONPCTSAT 34 11/25/2021   Lab Results  Component Value Date   RETICCTPCT 1.6 01/06/2022   RBC 4.69 01/06/2022   RBC 4.74 01/06/2022   No results found for: KPAFRELGTCHN,  LAMBDASER, KAPLAMBRATIO No results found for: IGGSERUM, IGA, IGMSERUM No results found for: Odetta Pink, SPEI   Chemistry      Component Value Date/Time   NA 139 11/25/2021 1108   NA 141 10/17/2021 1107   K 3.7 11/25/2021 1108   CL 105 11/25/2021 1108   CO2 27 11/25/2021 1108   BUN 13 11/25/2021 1108   BUN 12 10/17/2021 1107   CREATININE 0.70 11/25/2021 1108   CREATININE 0.68 10/29/2014 1520      Component Value Date/Time   CALCIUM 9.5 11/25/2021 1108   ALKPHOS 87 11/25/2021 1108   AST 16 11/25/2021 1108   ALT 11 11/25/2021 1108   BILITOT 0.8 11/25/2021 1108       Impression and Plan: Anna Hawkins is a very pleasant 46 yo African American female with history of iron deficiency anemia secondary to malabsorption as well as alpha thalassemia minor.  She will continue her daily Fusion Plus and Folic Acid.  Follow-up in 3 months.   Lottie Dawson, NP 1/17/20231:57 PM

## 2022-01-06 NOTE — Telephone Encounter (Signed)
Per 01/06/22 los - gave upcoming appointments - confirmed °

## 2022-01-07 LAB — FERRITIN: Ferritin: 18 ng/mL (ref 11–307)

## 2022-01-09 ENCOUNTER — Telehealth: Payer: Self-pay

## 2022-01-09 ENCOUNTER — Other Ambulatory Visit: Payer: Self-pay | Admitting: Family

## 2022-01-09 DIAGNOSIS — D509 Iron deficiency anemia, unspecified: Secondary | ICD-10-CM

## 2022-01-09 NOTE — Telephone Encounter (Signed)
Pt notified of decrease in iron levels per request of Lottie Dawson NP. Iron levels from 01/06/2022 reviewed with pt who verbalized understanding. Pt aware that at this time she could continue oral iron supplement or receive IV iron. At this time pt would like to continue with oral iron for one month and have labs repeated in one month. Lottie Dawson NP in agreement with plan. Pt aware scheduling will reach out to schedule lab appointment. Pt verbalized understanding and had no further questions.

## 2022-01-09 NOTE — Telephone Encounter (Signed)
-----   Message from Celso Amy, NP sent at 01/09/2022 12:13 PM EST ----- Iron counts have dropped. Would she be ok with getting IV iron or would she prefer to continue the oral iron supplement?   ----- Message ----- From: Interface, Lab In [facility] Sent: 01/06/2022   1:11 PM EST To: Celso Amy, NP

## 2022-01-19 ENCOUNTER — Encounter: Payer: 59 | Admitting: Gastroenterology

## 2022-02-04 ENCOUNTER — Inpatient Hospital Stay: Payer: 59

## 2022-02-10 ENCOUNTER — Other Ambulatory Visit: Payer: Self-pay

## 2022-02-10 ENCOUNTER — Inpatient Hospital Stay: Payer: 59 | Attending: Gastroenterology

## 2022-02-10 DIAGNOSIS — D508 Other iron deficiency anemias: Secondary | ICD-10-CM | POA: Insufficient documentation

## 2022-02-10 DIAGNOSIS — D563 Thalassemia minor: Secondary | ICD-10-CM

## 2022-02-10 DIAGNOSIS — D509 Iron deficiency anemia, unspecified: Secondary | ICD-10-CM

## 2022-02-10 LAB — CBC WITH DIFFERENTIAL (CANCER CENTER ONLY)
Abs Immature Granulocytes: 0.03 10*3/uL (ref 0.00–0.07)
Basophils Absolute: 0.1 10*3/uL (ref 0.0–0.1)
Basophils Relative: 1 %
Eosinophils Absolute: 0.7 10*3/uL — ABNORMAL HIGH (ref 0.0–0.5)
Eosinophils Relative: 8 %
HCT: 38.2 % (ref 36.0–46.0)
Hemoglobin: 11.3 g/dL — ABNORMAL LOW (ref 12.0–15.0)
Immature Granulocytes: 0 %
Lymphocytes Relative: 41 %
Lymphs Abs: 3.3 10*3/uL (ref 0.7–4.0)
MCH: 21.7 pg — ABNORMAL LOW (ref 26.0–34.0)
MCHC: 29.6 g/dL — ABNORMAL LOW (ref 30.0–36.0)
MCV: 73.5 fL — ABNORMAL LOW (ref 80.0–100.0)
Monocytes Absolute: 0.5 10*3/uL (ref 0.1–1.0)
Monocytes Relative: 7 %
Neutro Abs: 3.6 10*3/uL (ref 1.7–7.7)
Neutrophils Relative %: 43 %
Platelet Count: 261 10*3/uL (ref 150–400)
RBC: 5.2 MIL/uL — ABNORMAL HIGH (ref 3.87–5.11)
RDW: 18.8 % — ABNORMAL HIGH (ref 11.5–15.5)
WBC Count: 8.2 10*3/uL (ref 4.0–10.5)
nRBC: 0 % (ref 0.0–0.2)

## 2022-02-10 LAB — IRON AND IRON BINDING CAPACITY (CC-WL,HP ONLY)
Iron: 118 ug/dL (ref 28–170)
Saturation Ratios: 24 % (ref 10.4–31.8)
TIBC: 500 ug/dL — ABNORMAL HIGH (ref 250–450)
UIBC: 382 ug/dL (ref 148–442)

## 2022-02-10 LAB — RETICULOCYTES
Immature Retic Fract: 16.5 % — ABNORMAL HIGH (ref 2.3–15.9)
RBC.: 5.14 MIL/uL — ABNORMAL HIGH (ref 3.87–5.11)
Retic Count, Absolute: 46.3 10*3/uL (ref 19.0–186.0)
Retic Ct Pct: 0.9 % (ref 0.4–3.1)

## 2022-02-11 LAB — FERRITIN: Ferritin: 18 ng/mL (ref 11–307)

## 2022-02-23 ENCOUNTER — Ambulatory Visit (AMBULATORY_SURGERY_CENTER): Payer: 59 | Admitting: Gastroenterology

## 2022-02-23 ENCOUNTER — Other Ambulatory Visit: Payer: Self-pay

## 2022-02-23 ENCOUNTER — Encounter: Payer: Self-pay | Admitting: Gastroenterology

## 2022-02-23 ENCOUNTER — Other Ambulatory Visit: Payer: Self-pay | Admitting: *Deleted

## 2022-02-23 VITALS — BP 169/79 | HR 71 | Temp 97.7°F | Resp 12 | Ht 64.0 in | Wt 241.0 lb

## 2022-02-23 DIAGNOSIS — K219 Gastro-esophageal reflux disease without esophagitis: Secondary | ICD-10-CM | POA: Diagnosis not present

## 2022-02-23 DIAGNOSIS — K2960 Other gastritis without bleeding: Secondary | ICD-10-CM

## 2022-02-23 DIAGNOSIS — K449 Diaphragmatic hernia without obstruction or gangrene: Secondary | ICD-10-CM

## 2022-02-23 DIAGNOSIS — Z1211 Encounter for screening for malignant neoplasm of colon: Secondary | ICD-10-CM

## 2022-02-23 DIAGNOSIS — D123 Benign neoplasm of transverse colon: Secondary | ICD-10-CM

## 2022-02-23 MED ORDER — SODIUM CHLORIDE 0.9 % IV SOLN: 500.0000 mL | Freq: Once | INTRAVENOUS | Status: DC

## 2022-02-23 MED ORDER — PANTOPRAZOLE SODIUM 40 MG PO TBEC: DELAYED_RELEASE_TABLET | ORAL | 3 refills | Status: DC

## 2022-02-23 NOTE — Op Note (Signed)
[place] ?Patient Name: Anna Hawkins ?Procedure Date: 02/23/2022 1:53 PM ?MRN: [id number] ?Endoscopist: Jackquline Denmark , MD ?Age: 46 ?Referring MD:  ?Date of Birth: 1975/12/26 ?Gender: Female ?Account #: [id number] ?Procedure:                Colonoscopy ?Indications:              Screening for colorectal malignant neoplasm ?Medicines:                Monitored Anesthesia Care ?Procedure:                Pre-Anesthesia Assessment: ?                          - Prior to the procedure, a History and Physical  ?                          was performed, and patient medications and  ?                          allergies were reviewed. The patient's tolerance of  ?                          previous anesthesia was also reviewed. The risks  ?                          and benefits of the procedure and the sedation  ?                          options and risks were discussed with the patient.  ?                          All questions were answered, and informed consent  ?                          was obtained. Prior Anticoagulants: The patient has  ?                          taken no previous anticoagulant or antiplatelet  ?                          agents. ASA Grade Assessment: II - A patient with  ?                          mild systemic disease. After reviewing the risks  ?                          and benefits, the patient was deemed in  ?                          satisfactory condition to undergo the procedure. ?                          After obtaining informed consent, the colonoscope  ?  was passed under direct vision. Throughout the  ?                          procedure, the patient's blood pressure, pulse, and  ?                          oxygen saturations were monitored continuously. The  ?                          Olympus CF-[id number] (#[id number]) Colonoscope was  ?                          introduced through the anus and advanced to the 2  ?                          cm into the ileum. The  colonoscopy was performed  ?                          without difficulty. The patient tolerated the  ?                          procedure well. The quality of the bowel  ?                          preparation was good. The terminal ileum, ileocecal  ?                          valve, appendiceal orifice, and rectum were  ?                          photographed. ?Scope In: 2:18:54 PM ?Scope Out: 2:27:20 PM ?Scope Withdrawal Time: 0 hours 5 minutes 55 seconds  ?Total Procedure Duration: 0 hours 8 minutes 26 seconds  ?Findings:                 A 8 mm polyp was found in the proximal transverse  ?                          colon. The polyp was sessile. The polyp was removed  ?                          with a cold snare. Resection and retrieval were  ?                          complete. ?                          Non-bleeding internal hemorrhoids were found during  ?                          retroflexion. The hemorrhoids were small and Grade  ?                          I (internal hemorrhoids that do not prolapse). ?  The terminal ileum appeared normal. ?                          The exam was otherwise without abnormality on  ?                          direct and retroflexion views. ?Complications:            No immediate complications. ?Estimated Blood Loss:     Estimated blood loss: none. ?Impression:               - One 8 mm polyp in the proximal transverse colon,  ?                          removed with a cold snare. Resected and retrieved. ?                          - Non-bleeding internal hemorrhoids. ?                          - The examined portion of the ileum was normal. ?                          - The examination was otherwise normal on direct  ?                          and retroflexion views. ?Recommendation:           - Patient has a contact number available for  ?                          emergencies. The signs and symptoms of potential  ?                          delayed  complications were discussed with the  ?                          patient. Return to normal activities tomorrow.  ?                          Written discharge instructions were provided to the  ?                          patient. ?                          - Resume previous diet. ?                          - Continue present medications. ?                          - Await pathology results. ?                          - Repeat colonoscopy for surveillance based on  ?  pathology results. ?                          - The findings and recommendations were discussed  ?                          with the patient's family. ?Jackquline Denmark, MD ?02/23/2022 2:30:47 PM ?This report has been signed electronically. ?

## 2022-02-23 NOTE — Progress Notes (Signed)
Called to room to assist during endoscopic procedure.  Patient ID and intended procedure confirmed with present staff. Received instructions for my participation in the procedure from the performing physician.  

## 2022-02-23 NOTE — Progress Notes (Signed)
? ? ?Chief Complaint: IDA ? ?Referring Provider:  Carollee Leitz, MD    ? ? ?ASSESSMENT AND PLAN;  ? ?#1. GERD with ?HH ? ?#2. IDA ? ?Plan: ?-Protonix 66m po qd #90 ?-Hemoccult stools x 3 ?-Hematology consult for IDA ?-EGD/colon for further evaluation. ?-If still with problems, CTA followed by [facility] ?-CBC, CMP today. ?-Continue iron supplements for now. ?-D/W pt and pt's husband. ? ? ?I discussed EGD/Colonoscopy- the indications, risks, alternatives and potential complications including, but not limited to, bleeding, infection, reaction to medication, damage to internal organs, cardiac and/or pulmonary problems, and perforation requiring surgery. The possibility that significant findings could be missed was explained. All ? were answered. The patient gives consent to proceed. ?HPI:   ? ?Anna ALVISis a 46y.o. female  ?With morbid obesity s/p gastric sleeve 2019 (lost 90lb since), diet-controlled DM, hypothyroidism, HTN, asthma ? ?With IDA Hb 7.0 10/28, on iron. No melena or hematochezia.  No menstrual cycles x 1 year.  Has been craving ice. ? ?Longstanding H/O heartburn with every meal x yrs. Has been told previously that she has hiatal hernia (on UGI series several years ago, did not have EGD prior to gastric sleeve). No odynophagia or dysphagia. Has N/V 3 times a week specially when she overeats. ? ?Always had diarrhea every other day even before gastric sleeve surgery in 2019.  Some abdominal bloating.  Most prominently after eating.  No nocturnal symptoms.  No abdominal pain. ? ?Has never seen hematology. ? ?Never had an EGD/colonoscopy. ? ?No sodas, chocolates, chewing gums, artificial sweeteners and candy. No NSAIDs ? ?CBC Latest Ref Rng & Units 02/10/2022 01/06/2022 11/25/2021  ?WBC 4.0 - 10.5 K/uL 8.2 6.8 5.5  ?Hemoglobin 12.0 - 15.0 g/dL 11.3(L) 9.5(L) 8.9(L)  ?Hematocrit 36.0 - 46.0 % 38.2 33.2(L) 32.1(L)  ?Platelets 150 - 400 K/uL 261 220 272  ? ? ? ?SH-married, sEducation officer, [employer]  Has 3  girls. ?Past Medical History:  ?Diagnosis Date  ? Asthma   ? Chlamydia   ? Fatty liver   ? Graves disease   ? History of shoulder dystocia in prior pregnancy   ? Obesity   ? ? ?Past Surgical History:  ?Procedure Laterality Date  ? LAPAROSCOPIC TUBAL LIGATION  10/29/2011  ? Procedure: LAPAROSCOPIC TUBAL LIGATION;  Surgeon: PMora Bellman MD;  [facility];  Service: Gynecology;  Laterality: Bilateral;  ? SLEEVE GASTROPLASTY    ? THERAPEUTIC ABORTION    ? WISDOM TOOTH EXTRACTION    ? ? ?Family History  ?Problem Relation Age of Onset  ? Diabetes Mother   ? Hypertension Mother   ? Asthma Mother   ? Thyroid disease Mother   ? Diabetes Father   ? Cancer Father   ? Asthma Father   ? Hypertension Father   ? Diabetes Brother   ? Hypertension Brother   ? Asthma Brother   ? Colon cancer Neg Hx   ? Esophageal cancer Neg Hx   ? Stomach cancer Neg Hx   ? Rectal cancer Neg Hx   ? ? ?Social History  ? ?Tobacco Use  ? Smoking status: Never  ? Smokeless tobacco: Never  ?Vaping Use  ? Vaping Use: Never used  ?Substance Use Topics  ? Alcohol use: No  ? Drug use: No  ? ? ?Current Outpatient Medications  ?Medication Sig Dispense Refill  ? cetirizine (ZYRTEC) 10 MG tablet Take 1 tablet (10 mg total) by mouth daily. 30 tablet 11  ? folic acid (  FOLVITE) 1 MG tablet Take 1 tablet (1 mg total) by mouth daily. 30 tablet 11  ? levothyroxine (SYNTHROID) 112 MCG tablet TAKE 1 TABLET BY MOUTH EVERY DAY BEFORE BREAKFAST 30 tablet 6  ? pantoprazole (PROTONIX) 40 MG tablet Take 1 tablet (40 mg total) by mouth daily. 90 tablet 3  ? albuterol (VENTOLIN HFA) 108 (90 Base) MCG/ACT inhaler Inhale 1-2 puffs into the lungs every 4 (four) hours as needed for wheezing or shortness of breath. 18 g 4  ? blood glucose meter kit and supplies KIT Dispense based on patient and insurance preference. Use up to four times daily as directed. (FOR ICD-9 250.00, 250.01). 1 each 0  ? glucose blood test strip Use as instructed 100 each 12  ? Lancet Device MISC 1 Device  by Does not apply route daily. 90 each 3  ? Lancets (ACCU-CHEK MULTICLIX) lancets Use as instructed 100 each 12  ? ondansetron (ZOFRAN ODT) 4 MG disintegrating tablet Take 1 tablet (4 mg total) by mouth every 8 (eight) hours as needed for nausea or vomiting. 20 tablet 0  ? ?Current Facility-Administered Medications  ?Medication Dose Route Frequency Provider Last Rate Last Admin  ? 0.9 %  sodium chloride infusion  500 mL Intravenous Once Jackquline Denmark, MD      ? ? ?Allergies  ?Allergen Reactions  ? Lisinopril Rash and Cough  ? ? ?Review of Systems:  ?Constitutional: Denies fever, chills, diaphoresis, appetite change and fatigue.  ?HEENT: Has allergies ?Respiratory: Denies SOB, DOE, cough, chest tightness,  and wheezing.   ?Cardiovascular: Denies chest pain, palpitations and leg swelling.  ?Genitourinary: Denies dysuria, urgency, frequency, hematuria, flank pain and difficulty urinating.  ?Musculoskeletal: Denies myalgias, has back pain, joint swelling, arthralgias and gait problem.  ?Skin: No rash.  ?Neurological: Denies dizziness, seizures, syncope, weakness, light-headedness, numbness and headaches.  ?Hematological: Denies adenopathy. Easy bruising, personal or family bleeding history  ?Psychiatric/Behavioral: No anxiety or depression ? ?  ? ?Physical Exam:   ? ?BP (!) 172/91   Pulse 82   Temp 97.7 ?F (36.5 ?C) (Temporal)   Ht '5\' 4"'  (1.626 m)   Wt 241 lb (109.3 kg)   SpO2 100%   BMI 41.37 kg/m?  ?Wt Readings from Last 3 Encounters:  ?02/23/22 241 lb (109.3 kg)  ?01/06/22 247 lb 12.8 oz (112.4 kg)  ?11/25/21 240 lb (108.9 kg)  ? ?Constitutional:  Well-developed, in no acute distress. ?Psychiatric: Normal mood and affect. Behavior is normal. ?HEENT: Pupils normal.  Conjunctivae are normal. No scleral icterus. ?Neck supple.  ?Cardiovascular: Normal rate, regular rhythm. No edema ?Pulmonary/chest: Effort normal and breath sounds normal. No wheezing, rales or rhonchi. ?Abdominal: Soft, nondistended. Nontender.  Bowel sounds active throughout. There are no masses palpable. No hepatomegaly. ?Rectal: To be performed at the time of colonoscopy ?Neurological: Alert and oriented to person place and time. ?Skin: Skin is warm and dry. No rashes noted. ? ?Data Reviewed: I have personally reviewed following labs and imaging studies ? ?CBC: ?CBC Latest Ref Rng & Units 02/10/2022 01/06/2022 11/25/2021  ?WBC 4.0 - 10.5 K/uL 8.2 6.8 5.5  ?Hemoglobin 12.0 - 15.0 g/dL 11.3(L) 9.5(L) 8.9(L)  ?Hematocrit 36.0 - 46.0 % 38.2 33.2(L) 32.1(L)  ?Platelets 150 - 400 K/uL 261 220 272  ? ? ?CMP: ?CMP Latest Ref Rng & Units 11/25/2021 11/21/2021 10/17/2021  ?Glucose 70 - 99 mg/dL 86 69(L) 82  ?BUN 6 - 20 mg/dL '13 8 12  ' ?Creatinine 0.44 - 1.00 mg/dL 0.70 0.65 0.75  ?Sodium 135 -  145 mmol/L 139 137 141  ?Potassium 3.5 - 5.1 mmol/L 3.7 4.0 4.0  ?Chloride 98 - 111 mmol/L 105 102 104  ?CO2 22 - 32 mmol/L '27 26 23  ' ?Calcium 8.9 - 10.3 mg/dL 9.5 9.6 9.6  ?Total Protein 6.5 - 8.1 g/dL 7.6 8.0 -  ?Total Bilirubin 0.3 - 1.2 mg/dL 0.8 1.0 -  ?Alkaline Phos 38 - 126 U/L 87 93 -  ?AST 15 - 41 U/L 16 16 -  ?ALT 0 - 44 U/L 11 10 -  ? ? ? ? ? ?Carmell Austria, MD 02/23/2022, 1:50 PM ? ?Cc: Carollee Leitz, MD ? ? ?

## 2022-02-23 NOTE — Op Note (Signed)
[place] ?Patient Name: Anna Hawkins ?Procedure Date: 02/23/2022 1:53 PM ?MRN: [id number] ?Endoscopist: Jackquline Denmark , MD ?Age: 46 ?Referring MD:  ?Date of Birth: 1976-09-03 ?Gender: Female ?Account #: [id number] ?Procedure:                Upper GI endoscopy ?Indications:              Epigastric abdominal pain ?Medicines:                Monitored Anesthesia Care ?Procedure:                Pre-Anesthesia Assessment: ?                          - Prior to the procedure, a History and Physical  ?                          was performed, and patient medications and  ?                          allergies were reviewed. The patient's tolerance of  ?                          previous anesthesia was also reviewed. The risks  ?                          and benefits of the procedure and the sedation  ?                          options and risks were discussed with the patient.  ?                          All questions were answered, and informed consent  ?                          was obtained. Prior Anticoagulants: The patient has  ?                          taken no previous anticoagulant or antiplatelet  ?                          agents. ASA Grade Assessment: II - A patient with  ?                          mild systemic disease. After reviewing the risks  ?                          and benefits, the patient was deemed in  ?                          satisfactory condition to undergo the procedure. ?                          After obtaining informed consent, the endoscope was  ?  passed under direct vision. Throughout the  ?                          procedure, the patient's blood pressure, pulse, and  ?                          oxygen saturations were monitored continuously. The  ?                          Endoscope was introduced through the mouth, and  ?                          advanced to the second part of duodenum. The upper  ?                          GI endoscopy was accomplished  without difficulty.  ?                          The patient tolerated the procedure well. ?Scope In: ?Scope Out: ?Findings:                 LA Grade C (one or more mucosal breaks continuous  ?                          between tops of 2 or more mucosal folds, less than  ?                          75% circumference) esophagitis with no bleeding was  ?                          found 30 to 35 cm from the incisors. Biopsies were  ?                          taken with a cold forceps for histology. ?                          A 3 cm hiatal hernia was present from 35 cm to 38  ?                          cm (GE junction to diaphragmatic hiatus). ?                          Localized mild inflammation characterized by  ?                          erythema was found in the gastric antrum. Biopsies  ?                          were taken with a cold forceps for histology. ?                          Evidence of a sleeve gastrectomy was found in the  ?  stomach. This was characterized by healthy  ?                          appearing mucosa. ?                          The examined duodenum was normal. Biopsies for  ?                          histology were taken with a cold forceps for  ?                          evaluation of celiac disease. ?Complications:            No immediate complications. ?Estimated Blood Loss:     Estimated blood loss: none. ?Impression:               - LA Grade C reflux esophagitis with no bleeding.  ?                          Biopsied. ?                          - 3 cm hiatal hernia. ?                          - Gastritis. Biopsied. ?                          - A sleeve gastrectomy was found, characterized by  ?                          healthy appearing mucosa. ?                          - Normal examined duodenum. Biopsied. ?Recommendation:           - Patient has a contact number available for  ?                          emergencies. The signs and symptoms of potential  ?                           delayed complications were discussed with the  ?                          patient. Return to normal activities tomorrow.  ?                          Written discharge instructions were provided to the  ?                          patient. ?                          - Resume previous diet. ?                          -  Use Protonix (pantoprazole) 40 mg PO BID for 8  ?                          weeks, then once a day indefinitely. ?                          - Follow an antireflux regimen. ?                          - Avoid ibuprofen, naproxen, or other non-steroidal  ?                          anti-inflammatory drugs. ?                          - Await pathology results. ?                          - The findings and recommendations were discussed  ?                          with the patient's family. ?Jackquline Denmark, MD ?02/23/2022 2:35:40 PM ?This report has been signed electronically. ?

## 2022-02-23 NOTE — Progress Notes (Signed)
To Pacu, VSS. Report to [email or web address] 

## 2022-02-23 NOTE — Patient Instructions (Signed)
Handouts Provided:  Hiatal hernia and polyps ? ?YOU HAD AN ENDOSCOPIC PROCEDURE TODAY AT [facility]:   Refer to the procedure report that was given to you for any specific questions about what was found during the examination.  If the procedure report does not answer your questions, please call your gastroenterologist to clarify.  If you requested that your care partner not be given the details of your procedure findings, then the procedure report has been included in a sealed envelope for you to review at your convenience later. ? ?YOU SHOULD EXPECT: Some feelings of bloating in the abdomen. Passage of more gas than usual.  Walking can help get rid of the air that was put into your GI tract during the procedure and reduce the bloating. If you had a lower endoscopy (such as a colonoscopy or flexible sigmoidoscopy) you may notice spotting of blood in your stool or on the toilet paper. If you underwent a bowel prep for your procedure, you may not have a normal bowel movement for a few days. ? ?Please Note:  You might notice some irritation and congestion in your nose or some drainage.  This is from the oxygen used during your procedure.  There is no need for concern and it should clear up in a day or so. ? ?SYMPTOMS TO REPORT IMMEDIATELY: ? ?Following lower endoscopy (colonoscopy or flexible sigmoidoscopy): ? Excessive amounts of blood in the stool ? Significant tenderness or worsening of abdominal pains ? Swelling of the abdomen that is new, acute ? Fever of 100?F or higher ? ?Following upper endoscopy (EGD) ? Vomiting of blood or coffee ground material ? New chest pain or pain under the shoulder blades ? Painful or persistently difficult swallowing ? New shortness of breath ? Fever of 100?F or higher ? Black, tarry-looking stools ? ?For urgent or emergent issues, a gastroenterologist can be reached at any hour by calling [phone]. ?Do not use MyChart messaging for urgent concerns.  ? ? ?DIET:   We do recommend a small meal at first, but then you may proceed to your regular diet.  Drink plenty of fluids but you should avoid alcoholic beverages for 24 hours. ? ?ACTIVITY:  You should plan to take it easy for the rest of today and you should NOT DRIVE or use heavy machinery until tomorrow (because of the sedation medicines used during the test).   ? ?FOLLOW UP: ?Our staff will call the number listed on your records 48-72 hours following your procedure to check on you and address any questions or concerns that you may have regarding the information given to you following your procedure. If we do not reach you, we will leave a message.  We will attempt to reach you two times.  During this call, we will ask if you have developed any symptoms of COVID 19. If you develop any symptoms (ie: fever, flu-like symptoms, shortness of breath, cough etc.) before then, please call [phone].  If you test positive for Covid 19 in the 2 weeks post procedure, please call and report this information to [place].   ? ?If any biopsies were taken you will be contacted by phone or by letter within the next 1-3 weeks.  Please call us at [phone] if you have not heard about the biopsies in 3 weeks.  ? ? ?SIGNATURES/CONFIDENTIALITY: ?You and/or your care partner have signed paperwork which will be entered into your electronic medical record.  These signatures attest to the fact  that that the information above on your After Visit Summary has been reviewed and is understood.  Full responsibility of the confidentiality of this discharge information lies with you and/or your care-partner. ? ?

## 2022-02-25 ENCOUNTER — Telehealth: Payer: Self-pay | Admitting: *Deleted

## 2022-02-25 NOTE — Telephone Encounter (Signed)
Second follow ?

## 2022-02-25 NOTE — Telephone Encounter (Signed)
Attempted f/u phone call. No answer. Left message. °

## 2022-03-01 ENCOUNTER — Encounter: Payer: Self-pay | Admitting: Gastroenterology

## 2022-03-21 ENCOUNTER — Other Ambulatory Visit: Payer: Self-pay | Admitting: Endocrinology

## 2022-03-25 ENCOUNTER — Ambulatory Visit: Payer: 59 | Admitting: Endocrinology

## 2022-04-06 ENCOUNTER — Ambulatory Visit: Payer: 59 | Admitting: Family

## 2022-04-06 ENCOUNTER — Other Ambulatory Visit: Payer: 59

## 2022-04-13 ENCOUNTER — Inpatient Hospital Stay: Payer: 59 | Attending: Gastroenterology

## 2022-04-13 ENCOUNTER — Inpatient Hospital Stay: Payer: 59 | Admitting: Family

## 2022-04-13 ENCOUNTER — Ambulatory Visit (INDEPENDENT_AMBULATORY_CARE_PROVIDER_SITE_OTHER): Payer: 59 | Admitting: Endocrinology

## 2022-04-13 VITALS — BP 138/82 | HR 84 | Ht 64.0 in | Wt 255.2 lb

## 2022-04-13 DIAGNOSIS — E042 Nontoxic multinodular goiter: Secondary | ICD-10-CM

## 2022-04-13 LAB — T4, FREE: Free T4: 1.18 ng/dL (ref 0.60–1.60)

## 2022-04-13 LAB — TSH: TSH: 0.51 u[IU]/mL (ref 0.35–5.50)

## 2022-04-13 NOTE — Progress Notes (Signed)
? ?Subjective:  ? ? Patient ID: Anna Hawkins, female    DOB: [birth year], 46 y.o.   MRN: [id number] ? 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  pt states she feels well in general.  Specifically, she denies palpitations and tremor.   ?Past Medical History:  ?Diagnosis Date  ? Asthma   ? Chlamydia   ? Fatty liver   ? Graves disease   ? History of shoulder dystocia in prior pregnancy   ? Obesity   ? ? ?Past Surgical History:  ?Procedure Laterality Date  ? LAPAROSCOPIC TUBAL LIGATION  10/29/2011  ? Procedure: LAPAROSCOPIC TUBAL LIGATION;  Surgeon: Mora Bellman, MD;  Location: [facility];  Service: Gynecology;  Laterality: Bilateral;  ? SLEEVE GASTROPLASTY    ? THERAPEUTIC ABORTION    ? WISDOM TOOTH EXTRACTION    ? ? ?Social History  ? ?Socioeconomic History  ? Marital status: Married  ?  Spouse name: Not on file  ? Number of children: Not on file  ? Years of education: Not on file  ? Highest education level: Not on file  ?Occupational History  ? Not on file  ?Tobacco Use  ? Smoking status: Never  ? Smokeless tobacco: Never  ?Vaping Use  ? Vaping Use: Never used  ?Substance and Sexual Activity  ? Alcohol use: No  ? Drug use: No  ? Sexual activity: Yes  ?  Birth control/protection: Surgical  ?Other Topics Concern  ? Not on file  ?Social History Narrative  ? Not on file  ? ?Social Determinants of Health  ? ?Financial Resource Strain: Not on file  ?Food Insecurity: Not on file  ?Transportation Needs: Not on file  ?Physical Activity: Not on file  ?Stress: Not on file  ?Social Connections: Not on file  ?Intimate Partner Violence: Not on file  ? ? ?Current Outpatient Medications on File Prior to Visit  ?Medication Sig Dispense Refill  ? albuterol (VENTOLIN  HFA) 108 (90 Base) MCG/ACT inhaler Inhale 1-2 puffs into the lungs every 4 (four) hours as needed for wheezing or shortness of breath. 18 g 4  ? blood glucose meter kit and supplies KIT Dispense based on patient and insurance preference. Use up to four times daily as directed. (FOR ICD-9 250.00, 250.01). 1 each 0  ? cetirizine (ZYRTEC) 10 MG tablet Take 1 tablet (10 mg total) by mouth daily. 30 tablet 11  ? folic acid (FOLVITE) 1 MG tablet Take 1 tablet (1 mg total) by mouth daily. 30 tablet 11  ? glucose blood test strip Use as instructed 100 each 12  ? Lancet Device MISC 1 Device by Does not apply route daily. 90 each 3  ? Lancets (ACCU-CHEK MULTICLIX) lancets Use as instructed 100 each 12  ? levothyroxine (SYNTHROID) 112 MCG tablet TAKE 1 TABLET BY MOUTH EVERY DAY BEFORE BREAKFAST 30 tablet 6  ? ondansetron (ZOFRAN ODT) 4 MG disintegrating tablet Take 1 tablet (4 mg total) by mouth every 8 (eight) hours as needed for nausea or vomiting. 20 tablet 0  ? pantoprazole (PROTONIX) 40 MG tablet Take 40 mg PO twice daily for 8 weeks.  Then once a day indefinitely. 60 tablet 3  ? [DISCONTINUED] hydrochlorothiazide (HYDRODIURIL) 25 MG tablet TAKE  1 TABLET BY MOUTH EVERY DAY 30 tablet 1  ? ?No current facility-administered medications on file prior to visit.  ? ? ?Allergies  ?Allergen Reactions  ? Lisinopril Rash and Cough  ? ? ?Family History  ?Problem Relation Age of Onset  ? Diabetes Mother   ? Hypertension Mother   ? Asthma Mother   ? Thyroid disease Mother   ? Diabetes Father   ? Cancer Father   ? Asthma Father   ? Hypertension Father   ? Diabetes Brother   ? Hypertension Brother   ? Asthma Brother   ? Colon cancer Neg Hx   ? Esophageal cancer Neg Hx   ? Stomach cancer Neg Hx   ? Rectal cancer Neg Hx   ? ? ?BP 138/82 (BP Location: Left Arm, Patient Position: Sitting, Cuff Size: Normal)   Pulse 84   Ht _0  (1.626 m)   Wt 255 lb 3.2 oz (115.8 kg)   SpO2 98%   BMI 43.80 kg/m?  ? ? ?Review of Systems ? ?    ?Objective:  ? Physical Exam ?VITAL SIGNS:  See vs page ?GENERAL: no distress ?NEC: thyroid is slightly enlarged, with irreg surface, but no discrete nodule.   ? ? ?Lab Results  ?Component Value Date  ? TSH 0.51 04/13/2022  ? ?   ?Assessment & Plan:  ?Hypothyroidism: well-controlled.  Please continue the same synthroid ?Goiter, due for recheck: I ordered US ? ?

## 2022-04-13 NOTE — Patient Instructions (Addendum)
Thyroid blood tests are requested for you today.  We'll let you know about the results.   ?It is best to never miss the medication.  However, if you do miss it, next best is to double up the next time.   ?Let's recheck the ultrasound.  you will receive a phone call, about a day and time for an appointment.   ?You should have an endocrinology follow-up appointment in 1 year.   ? ?

## 2022-04-14 ENCOUNTER — Ambulatory Visit
Admission: RE | Admit: 2022-04-14 | Discharge: 2022-04-14 | Disposition: A | Discharge status: Home / Self Care | Payer: 59 | Source: Ambulatory Visit | Attending: Endocrinology | Admitting: Endocrinology

## 2022-04-14 DIAGNOSIS — E042 Nontoxic multinodular goiter: Secondary | ICD-10-CM

## 2022-05-26 ENCOUNTER — Encounter: Payer: Self-pay | Admitting: *Deleted

## 2022-07-15 ENCOUNTER — Ambulatory Visit: Payer: 59 | Admitting: Endocrinology

## 2022-07-21 ENCOUNTER — Other Ambulatory Visit: Payer: Self-pay | Admitting: Gastroenterology

## 2022-08-20 ENCOUNTER — Other Ambulatory Visit: Payer: Self-pay | Admitting: Gastroenterology

## 2022-08-31 ENCOUNTER — Encounter: Payer: Self-pay | Admitting: Family Medicine

## 2022-08-31 ENCOUNTER — Ambulatory Visit: Payer: 59 | Admitting: Family Medicine

## 2022-08-31 VITALS — BP 141/73 | HR 83 | Temp 98.4°F | Wt 254.0 lb

## 2022-08-31 DIAGNOSIS — E119 Type 2 diabetes mellitus without complications: Secondary | ICD-10-CM

## 2022-08-31 DIAGNOSIS — D508 Other iron deficiency anemias: Secondary | ICD-10-CM

## 2022-08-31 DIAGNOSIS — J399 Disease of upper respiratory tract, unspecified: Secondary | ICD-10-CM | POA: Diagnosis not present

## 2022-08-31 DIAGNOSIS — J301 Allergic rhinitis due to pollen: Secondary | ICD-10-CM | POA: Diagnosis not present

## 2022-08-31 DIAGNOSIS — K9189 Other postprocedural complications and disorders of digestive system: Secondary | ICD-10-CM | POA: Diagnosis not present

## 2022-08-31 DIAGNOSIS — I10 Essential (primary) hypertension: Secondary | ICD-10-CM

## 2022-08-31 DIAGNOSIS — J4540 Moderate persistent asthma, uncomplicated: Secondary | ICD-10-CM | POA: Insufficient documentation

## 2022-08-31 HISTORY — DX: Other iron deficiency anemias: D50.8

## 2022-08-31 LAB — POCT GLYCOSYLATED HEMOGLOBIN (HGB A1C): Hemoglobin A1C: 5.6 % (ref 4.0–5.6)

## 2022-08-31 MED ORDER — FLUTICASONE FUROATE-VILANTEROL 100-25 MCG/ACT IN AEPB: 1.0000 | INHALATION_SPRAY | Freq: Every day | RESPIRATORY_TRACT | 3 refills | Status: DC

## 2022-08-31 MED ORDER — ALBUTEROL SULFATE HFA 108 (90 BASE) MCG/ACT IN AERS: 2.0000 | INHALATION_SPRAY | Freq: Four times a day (QID) | RESPIRATORY_TRACT | 0 refills | Status: DC | PRN

## 2022-08-31 MED ORDER — FLUTICASONE PROPIONATE 50 MCG/ACT NA SUSP: 2.0000 | Freq: Every day | NASAL | 11 refills | Status: DC

## 2022-08-31 MED ORDER — CETIRIZINE HCL 10 MG PO TABS: 10.0000 mg | ORAL_TABLET | Freq: Every day | ORAL | 11 refills | Status: DC

## 2022-08-31 NOTE — Progress Notes (Signed)
  Date of Visit: 08/31/2022   HPI:  Anna Hawkins is a 46 y.o. here with her husband for 3 days of sore throat, congestion, cough, SOB, wheezing, and vomiting:  Viral symptoms Patient reports her symptoms began on Friday night. Her grandson is sick with similar symptoms. She denies fever, headache, and abdominal pain. She has used Robitussin, Theraflu, Flonase, and Mucinex for her symptoms. She usually uses albuterol 1x per day for wheezing. With onset of viral symptoms, she has been using albuterol 2-3x per day with temporary relief. Wheezing is worse at night. She endorses cough, congestion and nausea. Vomiting has resolved.  No chest pain or dyspnea. She has never used other medications for her asthma.  Hypertension Patient reports she stopped HCTZ in 2019 due to normal BPs. She has a BP cuff at home. She does not regularly check her BPs.  Iron deficiency anemia s/p gastrectomy Patient denies fatigue. She endorses dyspnea. She takes iron supplements daily that she purchased from [employer]. She requests a CBC today to f/u on anemia.  PHYSICAL EXAM: BP (!) 141/73   Pulse 83   Temp 98.4 F (36.9 C) (Oral)   Wt 254 lb (115.2 kg)   SpO2 97%   BMI 43.60 kg/m  Gen: Well appearing in no distress CV: Normal rate. Normal S1 and S2. No murmurs. Pulm: Normal work of breathing. No wheezing or crackles. Slightly diminished breath sounds Abdomen is soft nontender nondistended  HEENT: Cerumen noted in ears. Non-erythematous throat. Nares inflamed.  ASSESSMENT/PLAN: Upper Respiratory Tract infection Also considered exacerbation of asthma, pneumonia. These are less likely given her normal lung exam, no wheezing.  Likely viral. COVID test today. Will call with results. Continue with symptomatic management. Refilled albuterol Started controller inhaler--- has required steroids in past for asthma (2016)--lower suspicion that she needs systemic steroids today given symptoms Discussed PFT---she  will schedule Reviewed return precautions   Allergic rhinitis Refilled Zyrtec and Flonase today.  Type 2 diabetes mellitus without complication ([place]) Well-controlled with diet and exercise. A1C today is 5.6. Continue to monitor.  Essential hypertension Blood pressure is elevated at 140/84 and 141/73 on recheck. Monitor blood pressures at home. BPs can be elevated due to sickness and OTC cold medications. Follow-up in 1 month.  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 CBC and ferritin today. Will follow-up with results.  Moderate persistent asthma Given increased symptoms, start Breo inhaler twice daily. Continue to use albuterol as needed. Return to care in 1 month for asthma.  Follow-up Return in 1 month to f/u with PCP for chronic conditions. Recommend scheduling for PFT at follow up  She is overdue for her Pap   Leonette Nutting, [place]  Patient seen along with [place]. I personally evaluated this patient along with the student, and verified all aspects of the history, physical exam, and medical decision making as documented by the student. I agree with the student's documentation and have made all necessary edits.

## 2022-08-31 NOTE — Assessment & Plan Note (Addendum)
Well-controlled with diet and exercise. A1C today is 5.6. Continue to monitor.

## 2022-08-31 NOTE — Assessment & Plan Note (Signed)
Refilled Zyrtec and Flonase today.

## 2022-08-31 NOTE — Assessment & Plan Note (Signed)
Given increased symptoms, start Breo inhaler twice daily. Continue to use albuterol as needed. Return to care in 1 month for asthma.

## 2022-08-31 NOTE — Assessment & Plan Note (Signed)
Patient could have symptomatic dyspnea. CBC and ferritin today. Will follow-up with results.

## 2022-08-31 NOTE — Patient Instructions (Addendum)
It was good to see you today! Sorry you are not feeling well.  We have refilled your Albuterol, Zyrtec, and Flonase. Start using Breo inhaler twice a day: one in the morning and one at night. Continue to use albuterol inhaler as needed. Return to care if symptoms worsen or do not improve!  We will check your Hemoglobin today. We also did a COVID test today. We will contact you with results.

## 2022-08-31 NOTE — Assessment & Plan Note (Signed)
Blood pressure is elevated at 140/84 and 141/73 on recheck. Monitor blood pressures at home. BPs can be elevated due to sickness and OTC cold medications. Follow-up in 1 month.

## 2022-09-01 ENCOUNTER — Other Ambulatory Visit: Payer: Self-pay

## 2022-09-01 ENCOUNTER — Telehealth: Payer: Self-pay | Admitting: Family Medicine

## 2022-09-01 DIAGNOSIS — J4540 Moderate persistent asthma, uncomplicated: Secondary | ICD-10-CM

## 2022-09-01 LAB — CBC
Hematocrit: 38.6 % (ref 34.0–46.6)
Hemoglobin: 12.7 g/dL (ref 11.1–15.9)
MCH: 25.2 pg — ABNORMAL LOW (ref 26.6–33.0)
MCHC: 32.9 g/dL (ref 31.5–35.7)
MCV: 77 fL — ABNORMAL LOW (ref 79–97)
Platelets: 254 10*3/uL (ref 150–450)
RBC: 5.04 x10E6/uL (ref 3.77–5.28)
RDW: 13.1 % (ref 11.7–15.4)
WBC: 6.1 10*3/uL (ref 3.4–10.8)

## 2022-09-01 LAB — FERRITIN: Ferritin: 18 ng/mL (ref 15–150)

## 2022-09-01 LAB — NOVEL CORONAVIRUS, NAA: SARS-CoV-2, NAA: NOT DETECTED

## 2022-09-01 NOTE — Telephone Encounter (Signed)
Called patient with results. Has iron deficiency, no anemia. Recommend restarting daily ferrous sulfate. She reports improved URI symptoms today, awaiting COVID.  Dorris Singh, MD  Family Medicine Teaching Service

## 2022-10-30 ENCOUNTER — Telehealth: Payer: Self-pay | Admitting: Internal Medicine

## 2022-10-30 ENCOUNTER — Encounter: Payer: Self-pay | Admitting: Family Medicine

## 2022-10-30 DIAGNOSIS — E890 Postprocedural hypothyroidism: Secondary | ICD-10-CM

## 2022-10-30 MED ORDER — LEVOTHYROXINE SODIUM 112 MCG PO TABS: 112.0000 ug | ORAL_TABLET | Freq: Every day | ORAL | 0 refills | Status: DC

## 2022-10-30 NOTE — Telephone Encounter (Signed)
MEDICATION:  levothyroxine levothyroxine (SYNTHROID) 112 MCG tablet  PHARMACY:  [id number] - [place], [place] - [address] AT [address] (Ph: [phone])   HAS THE PATIENT CONTACTED THEIR PHARMACY?  Yes  IS THIS A 90 DAY SUPPLY : Yes  IS PATIENT OUT OF MEDICATION: Yes  IF NOT; HOW MUCH IS LEFT:   LAST APPOINTMENT DATE: @ 04/13/2022  NEXT APPOINTMENT DATE:@ Did not schedule at this time. Patient to check her work schedule & call back  DO [place]?: Yes  OTHER COMMENTS:    **Let patient know to contact pharmacy at the end of the day to make sure medication is ready. **  ** Please notify patient to allow 48-72 hours to process**  **Encourage patient to contact the pharmacy for refills or they can request refills through [facility]**

## 2022-10-30 NOTE — Telephone Encounter (Signed)
Called patient and informed that medication was sent to pharmacy by endocrinologist office.   Talbot Grumbling, RN

## 2022-10-30 NOTE — Telephone Encounter (Signed)
Rx sent to preferred pharmacy.

## 2022-11-25 ENCOUNTER — Other Ambulatory Visit: Payer: Self-pay | Admitting: Internal Medicine

## 2022-11-25 DIAGNOSIS — E890 Postprocedural hypothyroidism: Secondary | ICD-10-CM

## 2022-12-25 ENCOUNTER — Other Ambulatory Visit: Payer: Self-pay | Admitting: Internal Medicine

## 2022-12-25 DIAGNOSIS — E890 Postprocedural hypothyroidism: Secondary | ICD-10-CM

## 2022-12-30 ENCOUNTER — Other Ambulatory Visit: Payer: Self-pay | Admitting: Internal Medicine

## 2022-12-30 DIAGNOSIS — E890 Postprocedural hypothyroidism: Secondary | ICD-10-CM

## 2022-12-31 MED ORDER — LEVOTHYROXINE SODIUM 112 MCG PO TABS: 112.0000 ug | ORAL_TABLET | Freq: Every day | ORAL | 0 refills | Status: DC

## 2023-01-31 ENCOUNTER — Other Ambulatory Visit: Payer: Self-pay | Admitting: Internal Medicine

## 2023-01-31 DIAGNOSIS — E890 Postprocedural hypothyroidism: Secondary | ICD-10-CM

## 2023-02-01 ENCOUNTER — Encounter: Payer: Self-pay | Admitting: Internal Medicine

## 2023-02-01 ENCOUNTER — Ambulatory Visit: Payer: 59 | Admitting: Internal Medicine

## 2023-02-01 VITALS — BP 122/70 | HR 76 | Ht 64.0 in | Wt 269.0 lb

## 2023-02-01 DIAGNOSIS — E890 Postprocedural hypothyroidism: Secondary | ICD-10-CM | POA: Diagnosis not present

## 2023-02-01 LAB — TSH: TSH: 0.82 u[IU]/mL (ref 0.35–5.50)

## 2023-02-01 NOTE — Progress Notes (Signed)
Name: Anna Hawkins  MRN/ DOB: [id number], 1976-02-27    Age/ Sex: 47 y.o., female     PCP: Jacelyn Grip, MD   Reason for Endocrinology Evaluation: Postablative hypothyroid     Initial Endocrinology Clinic Visit: 04/22/2016    PATIENT IDENTIFIER: Anna Hawkins is a 47 y.o., female with a past medical history of HTN, asthma, hypothyroid, and DM. She has followed with [facility] since 04/22/2016 for consultative assistance with management of her post ablative hypothyroid.   HISTORICAL SUMMARY: The patient was first diagnosed with hyperthyroidism in 2017.    She has been diagnosed with multinodular goiter since 2002.  Thyroid ultrasound 03/2022 showed decrease in the size of the nodule and no further follow-up was recommended  She is s/p RAI in 2017 with 14.3 mCi I-131 sodium iodide orally and has been on LT-for replacement  Mother with thyroid disease   SUBJECTIVE:    Today (02/01/2023):  Anna Hawkins is here for a follow-up on postablative hypothyroidism.  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 She has never used smokeless tobacco. She reports that she does not drink alcohol  and does not use drugs. Family History:  Family History  Problem Relation Age of Onset   Diabetes Mother    Hypertension Mother    Asthma Mother    Thyroid disease Mother    Diabetes Father    Cancer Father    Asthma Father    Hypertension Father    Diabetes Brother    Hypertension Brother    Asthma Brother    Colon cancer Neg Hx    Esophageal cancer Neg Hx    Stomach cancer Neg Hx    Rectal cancer Neg Hx      HOME MEDICATIONS: Allergies as of 02/01/2023       Reactions   Lisinopril Rash, Cough        Medication List        Accurate as of February 01, 2023 11:28 AM. If you have any questions, ask your nurse or doctor.          accu-chek multiclix lancets Use as instructed   albuterol 108 (90 Base) MCG/ACT inhaler Commonly known as: VENTOLIN HFA Inhale 2 puffs into the lungs every 6 (six) hours as needed for wheezing or shortness of breath. What changed: Another medication with the same name was removed. Continue taking this medication, and follow the directions you see here. Changed by: Dorita Sciara, MD   amoxicillin 500 MG capsule Commonly known as: AMOXIL Take 500 mg by mouth 3 (three) times daily.   blood  glucose meter kit and supplies Kit Dispense based on patient and insurance preference. Use up to four times daily as directed. (FOR ICD-9 250.00, 250.01).   cetirizine 10 MG tablet Commonly known as: ZYRTEC Take 1 tablet (10 mg total) by mouth daily.   fluconazole 150 MG tablet Commonly known as: DIFLUCAN Take 150 mg by mouth once.   fluticasone 50 MCG/ACT nasal spray Commonly known as: FLONASE Place 2 sprays into both nostrils daily.   fluticasone furoate-vilanterol 100-25 MCG/ACT Aepb Commonly known as: Breo Ellipta Inhale 1 puff into the lungs daily.   folic acid 1 MG tablet Commonly known as: FOLVITE Take 1 tablet (1 mg total) by mouth daily.   glucose blood test strip Use as instructed   ibuprofen 800 MG tablet Commonly  known as: ADVIL Take 800 mg by mouth every 6 (six) hours as needed. for pain   Lancet Device Misc 1 Device by Does not apply route daily.   levothyroxine 112 MCG tablet Commonly known as: SYNTHROID TAKE 1 TABLET(112 MCG) BY MOUTH DAILY BEFORE BREAKFAST What changed: Another medication with the same name was removed. Continue taking this medication, and follow the directions you see here. Changed by: Dorita Sciara, MD   ondansetron 4 MG disintegrating tablet Commonly known as: Zofran ODT Take 1 tablet (4 mg total) by mouth every 8 (eight) hours as needed for nausea or vomiting.   pantoprazole 40 MG tablet Commonly known as: PROTONIX TAKE 1 TABLET BY MOUTH TWICE DAILY FOR 8 WEEKS, THEN ONCE DAILY INDEFINITELY          OBJECTIVE:   PHYSICAL EXAM: VS: BP 122/70 (BP Location: Right Arm, Patient Position: Sitting, Cuff Size: Large)   Pulse 76   Ht 5' 4"$  (1.626 m)   Wt 269 lb (122 kg)   SpO2 98%   BMI 46.17 kg/m    EXAM: General: Pt appears well and is in NAD  Eyes: External eye exam normal without stare, lid lag or exophthalmos.  EOM intact.    Neck: General: Supple without adenopathy. Thyroid: No goiter or nodules appreciated. No thyroid bruit.  Lungs: Clear with good BS bilat   Heart: Auscultation: RRR.  Abdomen: Normoactive bowel sounds, soft, nontender, without masses or organomegaly palpable  Extremities:  BL LE: No pretibial edema   Mental Status: Judgment, insight: Intact Orientation: Oriented to time, place, and person Mood and affect: No depression, anxiety, or agitation     DATA REVIEWED: ***   Thyroid Ultrasound 04/14/2022  Nodule # 1:   Prior biopsy: No   Location: Left; mid   Maximum size: 1.4 cm; Other 2 dimensions: 0.8 x 0.6 cm, previously, 2.5 x 1.7 x 1.7 cm   Composition: solid/almost completely solid (2)   Echogenicity: isoechoic (1)   Shape: not taller-than-wide (0)   Margins: ill-defined (0)   Echogenic foci: none (0)    ACR TI-RADS total points: 3.   ACR TI-RADS risk category:  TR3 (3 points).   Significant change in size (>/= 20% in two dimensions and minimal increase of 2 mm): No   Change in features: No   Change in ACR TI-RADS risk category: No   ACR TI-RADS recommendations:   Given decrease in size since 2017, this is consistent with a benign nodule.   _________________________________________________________   IMPRESSION: Nodule 1 (TI-RADS 3), located in the mid left thyroid lobe demonstrates decrease in size since 2017, consistent with a benign etiology. It does not require further imaging follow-up.   The above  is in keeping with the ACR TI-RADS recommendations - J Am Coll Radiol 2017;14:587-595.      ASSESSMENT / PLAN / RECOMMENDATIONS:   Postablative Hypothyroidism:  -Patient is clinically euthyroid -No local neck symptoms -- Pt educated extensively on the correct way to take levothyroxine (first thing in the morning with water, 30 minutes before eating or taking other medications). - Pt encouraged to double dose the following day if she were to miss a dose given long half-life of levothyroxine.   Medications   Levothyroxine 112 mcg daily   Signed electronically by: Mack Guise, MD  [facility]  [facility] [address] [place], [place] [address] Phone: [phone] FAX: [phone]      CC: Jacelyn Grip, MD [place] [address] Phone: [phone]  Fax: [phone]   Return to Endocrinology clinic as below: Future Appointments  Date Time Provider [place]  02/01/2023 11:30 AM Bastion Bolger, Melanie Crazier, MD [facility] None

## 2023-02-01 NOTE — Patient Instructions (Signed)

## 2023-02-02 MED ORDER — LEVOTHYROXINE SODIUM 112 MCG PO TABS: 112.0000 ug | ORAL_TABLET | Freq: Every day | ORAL | 3 refills | Status: DC

## 2023-07-30 ENCOUNTER — Other Ambulatory Visit: Payer: Self-pay | Admitting: Internal Medicine

## 2023-07-30 DIAGNOSIS — E890 Postprocedural hypothyroidism: Secondary | ICD-10-CM

## 2023-08-02 NOTE — Progress Notes (Deleted)
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 She has followed with  Endocrinology clinic since 04/22/2016 for consultative assistance with management of her post ablative hypothyroid.   HISTORICAL SUMMARY: The patient was first diagnosed with hyperthyroidism in 2017.    She has been diagnosed with multinodular goiter since 2002.  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 She has never used smokeless tobacco. She reports that she does not drink alcohol  and does not use drugs. Family History:  Family History  Problem Relation Age of Onset   Diabetes Mother    Hypertension Mother    Asthma Mother    Thyroid disease Mother    Diabetes Father    Cancer Father    Asthma Father    Hypertension Father    Diabetes Brother    Hypertension Brother    Asthma Brother    Colon cancer Neg Hx    Esophageal cancer Neg Hx    Stomach cancer Neg Hx    Rectal cancer Neg Hx      HOME MEDICATIONS: Allergies as of 08/03/2023       Reactions   Lisinopril Rash, Cough        Medication List        Accurate as of August 03, 2023  7:26 AM. If you have any questions, ask your nurse or doctor.          accu-chek multiclix lancets Use as instructed   albuterol 108 (90 Base) MCG/ACT inhaler Commonly known as: VENTOLIN HFA Inhale 2 puffs into the lungs every 6 (six) hours as needed for wheezing or shortness of breath.   amoxicillin 500 MG capsule Commonly known as: AMOXIL Take 500 mg by mouth 3 (three) times daily.   blood glucose meter kit and supplies Kit Dispense based on patient and insurance preference. Use up to four times daily as directed. (FOR ICD-9 250.00, 250.01).  cetirizine 10 MG tablet Commonly known as: ZYRTEC Take 1 tablet (10 mg total) by mouth daily.   fluconazole 150 MG tablet Commonly known as: DIFLUCAN Take 150 mg by mouth once.   fluticasone 50 MCG/ACT nasal spray Commonly known as: FLONASE Place 2 sprays into both nostrils daily.   fluticasone furoate-vilanterol 100-25 MCG/ACT Aepb Commonly known as: Breo Ellipta Inhale 1 puff into the lungs daily.   folic acid 1 MG tablet Commonly known as: FOLVITE Take 1 tablet (1 mg total) by mouth daily.   glucose blood test strip Use as instructed   ibuprofen 800 MG tablet Commonly known as: ADVIL Take 800 mg by mouth every 6 (six) hours as needed. for pain   Lancet Device Misc 1 Device by Does not apply route daily.   levothyroxine 112 MCG  tablet Commonly known as: SYNTHROID TAKE 1 TABLET(112 MCG) BY MOUTH DAILY BEFORE BREAKFAST   ondansetron 4 MG disintegrating tablet Commonly known as: Zofran ODT Take 1 tablet (4 mg total) by mouth every 8 (eight) hours as needed for nausea or vomiting.   pantoprazole 40 MG tablet Commonly known as: PROTONIX TAKE 1 TABLET BY MOUTH TWICE DAILY FOR 8 WEEKS, THEN ONCE DAILY INDEFINITELY          OBJECTIVE:   PHYSICAL EXAM: VS: There were no vitals taken for this visit.   EXAM: General: Pt appears well and is in NAD  Eyes: External eye exam normal without stare, lid lag or exophthalmos.  EOM intact.    Neck:  Thyroid: No goiter or nodules appreciated.  Lungs: Clear with good BS bilat   Heart: Auscultation: RRR.  Abdomen: soft, nontender  Extremities:  BL LE: No pretibial edema   Mental Status: Judgment, insight: Intact Orientation: Oriented to time, place, and person Mood and affect: No depression, anxiety, or agitation     DATA REVIEWED:   Latest Reference Range & Units 02/01/23 11:46  TSH 0.35 - 5.50 uIU/mL 0.82    Thyroid Ultrasound 04/14/2022  Nodule # 1:   Prior biopsy: No   Location: Left; mid   Maximum size: 1.4 cm; Other 2 dimensions: 0.8 x 0.6 cm, previously, 2.5 x 1.7 x 1.7 cm   Composition: solid/almost completely solid (2)   Echogenicity: isoechoic (1)   Shape: not taller-than-wide (0)   Margins: ill-defined (0)   Echogenic foci: none (0)   ACR TI-RADS total points: 3.   ACR TI-RADS risk category:  TR3 (3 points).   Significant change in size (>/= 20% in two dimensions and minimal increase of 2 mm): No   Change in features: No   Change in ACR TI-RADS risk category: No   ACR TI-RADS recommendations:   Given decrease in size since 2017, this is consistent with a benign nodule.   _________________________________________________________   IMPRESSION: Nodule 1 (TI-RADS 3), located in the mid left thyroid lobe demonstrates  decrease in size since 2017, consistent with a benign etiology. It does not require further imaging follow-up.   The above is in keeping with the ACR TI-RADS recommendations - J Am Coll Radiol 2017;14:587-595.      ASSESSMENT / PLAN / RECOMMENDATIONS:   Postablative Hypothyroidism:  -Patient is clinically euthyroid -No local neck symptoms -- Pt educated extensively on the correct way to take levothyroxine (first thing in the morning with water, 30 minutes before eating or taking other medications). - Pt encouraged to double dose the following day if she were to miss a dose given long half-life of levothyroxine. -  TSH normal  Medications   Continue levothyroxine 112 mcg daily   Signed electronically by: Lyndle Herrlich, MD  [facility] Endocrinology  [facility] [address] [place], [place] [address] Phone: [phone] FAX: [phone]      CC: Evette Georges, MD [address] [place] [address] Phone: [phone]  Fax: [phone]   Return to Endocrinology clinic as below: Future Appointments  Date Time Provider Department Center  08/03/2023  8:10 AM Brayn Eckstein, Konrad Dolores, MD [id number] None

## 2023-08-03 ENCOUNTER — Ambulatory Visit: Payer: 59 | Admitting: Internal Medicine

## 2023-08-20 ENCOUNTER — Telehealth: Payer: Self-pay | Admitting: Internal Medicine

## 2023-08-20 ENCOUNTER — Other Ambulatory Visit: Payer: Self-pay

## 2023-08-20 DIAGNOSIS — E890 Postprocedural hypothyroidism: Secondary | ICD-10-CM

## 2023-08-20 MED ORDER — LEVOTHYROXINE SODIUM 112 MCG PO TABS
112.0000 ug | ORAL_TABLET | Freq: Every day | ORAL | 0 refills | Status: AC
Start: 2023-08-20 — End: ?

## 2023-08-20 NOTE — Telephone Encounter (Signed)
Patient advise she needs a refill on her Thyroid medicine. Please call into [facility] on [address]

## 2023-08-20 NOTE — Telephone Encounter (Signed)
I sent Levothyroxine into [facility]

## 2023-09-10 ENCOUNTER — Other Ambulatory Visit: Payer: Self-pay

## 2023-09-10 ENCOUNTER — Encounter: Payer: Self-pay | Admitting: Family Medicine

## 2023-09-10 ENCOUNTER — Ambulatory Visit (INDEPENDENT_AMBULATORY_CARE_PROVIDER_SITE_OTHER): Payer: 59 | Admitting: Family Medicine

## 2023-09-10 VITALS — BP 116/97 | HR 91 | Ht 64.0 in | Wt 275.8 lb

## 2023-09-10 DIAGNOSIS — E119 Type 2 diabetes mellitus without complications: Secondary | ICD-10-CM | POA: Diagnosis not present

## 2023-09-10 DIAGNOSIS — K9189 Other postprocedural complications and disorders of digestive system: Secondary | ICD-10-CM | POA: Diagnosis not present

## 2023-09-10 DIAGNOSIS — I10 Essential (primary) hypertension: Secondary | ICD-10-CM | POA: Diagnosis not present

## 2023-09-10 DIAGNOSIS — J4540 Moderate persistent asthma, uncomplicated: Secondary | ICD-10-CM

## 2023-09-10 DIAGNOSIS — E890 Postprocedural hypothyroidism: Secondary | ICD-10-CM

## 2023-09-10 DIAGNOSIS — D508 Other iron deficiency anemias: Secondary | ICD-10-CM

## 2023-09-10 DIAGNOSIS — K219 Gastro-esophageal reflux disease without esophagitis: Secondary | ICD-10-CM | POA: Insufficient documentation

## 2023-09-10 MED ORDER — PANTOPRAZOLE SODIUM 40 MG PO TBEC: DELAYED_RELEASE_TABLET | ORAL | 0 refills | Status: DC

## 2023-09-10 MED ORDER — UMECLIDINIUM-VILANTEROL 62.5-25 MCG/ACT IN AEPB: 1.0000 | INHALATION_SPRAY | Freq: Every day | RESPIRATORY_TRACT | 1 refills | Status: DC

## 2023-09-10 MED ORDER — FLUTICASONE-SALMETEROL 100-50 MCG/ACT IN AEPB: 1.0000 | INHALATION_SPRAY | Freq: Two times a day (BID) | RESPIRATORY_TRACT | 1 refills | Status: DC

## 2023-09-10 NOTE — Assessment & Plan Note (Signed)
Previous A1c 5.6.  Will collect repeat A1c, lipid panel, microalbumin/creatinine ratio today and follow-up results.

## 2023-09-10 NOTE — Assessment & Plan Note (Signed)
Patient has had a little bit more fatigue recently.  Will recheck iron studies today.  Continue iron supplement for now.

## 2023-09-10 NOTE — Progress Notes (Cosign Needed Addendum)
    SUBJECTIVE:   Chief compliant/HPI: annual examination  Anna Hawkins is a 47 y.o. who presents today for an annual exam.    She has no concerns other than feeling more fatigued recently like when she is anemic. She also notes having to use albuterol a lot because she cannot afford her breo ellipta.  History tabs reviewed and updated.   OBJECTIVE:   BP (!) 116/97   Pulse 91   Ht 5\' 4"  (1.626 m)   Wt 275 lb 12.8 oz (125.1 kg)   SpO2 100%   BMI 47.34 kg/m   General: Alert and oriented, in NAD Skin: Warm, dry, and intact without lesions HEENT: NCAT, EOM grossly normal, midline nasal septum Cardiac: RRR, no m/r/g appreciated Respiratory: CTAB, breathing and speaking comfortably on RA Abdominal: Soft, nontender, nondistended, normoactive bowel sounds Extremities: Moves all extremities grossly equally Neurological: No gross focal deficit Psychiatric: Appropriate mood and affect  ASSESSMENT/PLAN:   Essential hypertension Diastolic blood pressure elevated on recheck.  Can recheck at next visit.  If persistently elevated, consider initiating losartan-HCTZ.  Moderate persistent asthma Symptoms not controlled.  Breo difficult to afford.  Will switch to Advair.  Follow-up improvement.  Type 2 diabetes mellitus without complication (HCC) Previous A1c 5.6.  Will collect repeat A1c, lipid panel, microalbumin/creatinine ratio today and follow-up results.  Hypothyroidism TSH earlier this year normal.  Continue current regimen.  Can recheck next year.  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  Will recheck iron studies today.  Continue iron supplement for now.  GERD (gastroesophageal reflux disease) Will refill Protonix.   Annual Examination  PHQ score 3, reviewed.  Considered the following items based upon USPSTF recommendations: Diabetes screening: ordered. Foot exam completed.  Asked patient to have eye doctor send [place]  diabetic eye exam records. Screening for elevated cholesterol: ordered HIV testing: discussed Hepatitis C: discussed Hepatitis B: discussed Syphilis if at high risk: discussed GC/CT not at high risk and not ordered.  Discussed family history, BRCA testing not indicated. Cervical cancer screening: discussed and declined.  She would like this done at next visit in 2 weeks. Breast cancer screening: discussed potential benefits, risks including overdiagnosis and biopsy, elected proceed with mammogram Colorectal cancer screening: up to date on screening for CRC. if age 69 or over.   Follow-up in 2 weeks for blood pressure recheck and Pap smear.  Janeal Holmes, MD [facility]

## 2023-09-10 NOTE — Assessment & Plan Note (Signed)
Diastolic blood pressure elevated on recheck.  Can recheck at next visit.  If persistently elevated, consider initiating losartan-HCTZ.

## 2023-09-10 NOTE — Assessment & Plan Note (Signed)
TSH earlier this year normal.  Continue current regimen.  Can recheck next year.

## 2023-09-10 NOTE — Assessment & Plan Note (Signed)
Will refill Protonix

## 2023-09-10 NOTE — Assessment & Plan Note (Signed)
Symptoms not controlled.  Breo difficult to afford.  Will switch to Advair.  Follow-up improvement.

## 2023-09-10 NOTE — Patient Instructions (Addendum)
We will get labs today. I will message you on MyChart with results. Please ask your eye doctor to send [place] records. Pick up the ADVAIR from your pharmacy since this one is covered by your insurance. Come back in 2 weeks for blood pressure check and pap smear. I will refer you for mammogram. I will send in protonix for your reflux.

## 2023-09-12 LAB — IRON,TIBC AND FERRITIN PANEL
Ferritin: 9 ng/mL — ABNORMAL LOW (ref 15–150)
Iron Saturation: 7 % — CL (ref 15–55)
Iron: 31 ug/dL (ref 27–159)
Total Iron Binding Capacity: 415 ug/dL (ref 250–450)
UIBC: 384 ug/dL (ref 131–425)

## 2023-09-12 LAB — MICROALBUMIN / CREATININE URINE RATIO
Creatinine, Urine: 222.3 mg/dL
Microalb/Creat Ratio: 9 mg/g{creat} (ref 0–29)
Microalbumin, Urine: 20.4 ug/mL

## 2023-09-12 LAB — LIPID PANEL
Chol/HDL Ratio: 2.5 ratio (ref 0.0–4.4)
Cholesterol, Total: 170 mg/dL (ref 100–199)
HDL: 68 mg/dL (ref >39)
LDL Chol Calc (NIH): 88 mg/dL (ref 0–99)
Triglycerides: 76 mg/dL (ref 0–149)
VLDL Cholesterol Cal: 14 mg/dL (ref 5–40)

## 2023-09-12 LAB — HEMOGLOBIN A1C
Est. average glucose Bld gHb Est-mCnc: 143 mg/dL
Hgb A1c MFr Bld: 6.6 % — ABNORMAL HIGH (ref 4.8–5.6)

## 2023-09-13 NOTE — Progress Notes (Signed)
Called and lvm for patient to call back and schedule appointment.   Thank you Nehemiah Settle

## 2023-09-14 ENCOUNTER — Other Ambulatory Visit: Payer: Self-pay

## 2023-09-14 DIAGNOSIS — J301 Allergic rhinitis due to pollen: Secondary | ICD-10-CM

## 2023-09-14 MED ORDER — CETIRIZINE HCL 10 MG PO TABS
10.0000 mg | ORAL_TABLET | Freq: Every day | ORAL | 11 refills | Status: AC
Start: 2023-09-14 — End: ?

## 2023-09-17 ENCOUNTER — Ambulatory Visit: Payer: 59 | Admitting: Internal Medicine

## 2023-09-17 ENCOUNTER — Encounter: Payer: Self-pay | Admitting: Internal Medicine

## 2023-09-17 VITALS — BP 122/78 | HR 73 | Ht 64.0 in | Wt 277.0 lb

## 2023-09-17 DIAGNOSIS — E890 Postprocedural hypothyroidism: Secondary | ICD-10-CM | POA: Diagnosis not present

## 2023-09-17 DIAGNOSIS — E119 Type 2 diabetes mellitus without complications: Secondary | ICD-10-CM | POA: Diagnosis not present

## 2023-09-17 DIAGNOSIS — E042 Nontoxic multinodular goiter: Secondary | ICD-10-CM | POA: Diagnosis not present

## 2023-09-17 DIAGNOSIS — Z7985 Long-term (current) use of injectable non-insulin antidiabetic drugs: Secondary | ICD-10-CM

## 2023-09-17 LAB — T4, FREE: Free T4: 1.34 ng/dL (ref 0.60–1.60)

## 2023-09-17 LAB — TSH: TSH: 0.59 u[IU]/mL (ref 0.35–5.50)

## 2023-09-17 MED ORDER — TIRZEPATIDE 2.5 MG/0.5ML ~~LOC~~ SOAJ: 2.5000 mg | SUBCUTANEOUS | 2 refills | Status: DC

## 2023-09-17 MED ORDER — LEVOTHYROXINE SODIUM 112 MCG PO TABS
112.0000 ug | ORAL_TABLET | Freq: Every day | ORAL | 3 refills | Status: DC
Start: 2023-09-17 — End: 2024-02-21

## 2023-09-17 NOTE — Progress Notes (Signed)
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 She has followed with [facility] since 04/22/2016 for consultative assistance with management of her post ablative hypothyroid.   HISTORICAL SUMMARY: The patient was first diagnosed with hyperthyroidism in 2017.    She has been diagnosed with multinodular goiter since 2002.  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 is here for a follow-up on postablative hypothyroidism.  She is accompanied by her spouse today  She was noted with weight gain  She had recent labs 09/10/2023 sowing an A1c 6.6%, she has Hx DM  in 2001, was on Trulicity , intolerant to Metformin  But medications discontinued with weight loss   Denies local neck swelling  Denies constipation or diarrhea  Denies palpitations  No biotin   Levothyroxine 112 mcg daily    HISTORY:  Past Medical History:  Past Medical History:  Diagnosis Date   Asthma    Chlamydia    Fatty liver    Graves disease    History of shoulder dystocia in prior pregnancy    Iron deficiency anemia  after gastrectomy 08/31/2022   Obesity    Past Surgical History:  Past Surgical History:  Procedure Laterality Date   LAPAROSCOPIC TUBAL LIGATION  10/29/2011   Procedure: LAPAROSCOPIC TUBAL LIGATION;  Surgeon: Catalina Antigua, MD;  Location: [facility];  Service: Gynecology;  Laterality: Bilateral;   SLEEVE GASTROPLASTY     THERAPEUTIC ABORTION     WISDOM TOOTH EXTRACTION     Social History:  reports that she has never smoked. She has never used smokeless tobacco. She reports that she does not drink alcohol and does not use drugs. Family History:  Family History  Problem Relation Age of Onset   Diabetes Mother    Hypertension Mother    Asthma Mother    Thyroid disease Mother    Diabetes Father    Cancer Father    Asthma Father    Hypertension Father    Diabetes Brother    Hypertension Brother    Asthma Brother    Colon cancer Neg Hx    Esophageal cancer Neg Hx    Stomach cancer Neg Hx    Rectal cancer Neg Hx      HOME MEDICATIONS: Allergies as of 09/17/2023       Reactions   Lisinopril Rash, Cough        Medication List        Accurate as of September 17, 2023  9:43 AM. If you have any questions, ask  your nurse or doctor.          accu-chek multiclix lancets Use as instructed   albuterol 108 (90 Base) MCG/ACT inhaler Commonly known as: VENTOLIN HFA Inhale 2 puffs into the lungs every 6 (six) hours as needed for wheezing or shortness of breath.   Anoro Ellipta 62.5-25 MCG/ACT Aepb Generic drug: umeclidinium-vilanterol Inhale 1 puff into the lungs daily.   blood glucose meter kit and supplies Kit Dispense based on patient and insurance preference. Use up to four times daily as directed. (FOR ICD-9 250.00, 250.01).   cetirizine 10 MG tablet Commonly known as: ZYRTEC Take 1 tablet (10 mg total) by mouth daily.   fluticasone 50 MCG/ACT nasal spray Commonly known as: FLONASE 2 sprays daily as needed for rhinitis.   fluticasone 50 MCG/ACT nasal  spray Commonly known as: FLONASE Place 2 sprays into both nostrils daily.   fluticasone-salmeterol 100-50 MCG/ACT Aepb Commonly known as: ADVAIR Inhale 1 puff into the lungs 2 (two) times daily.   folic acid 1 MG tablet Commonly known as: FOLVITE Take 1 tablet (1 mg total) by mouth daily.   glucose blood test strip Use as instructed   ibuprofen 800 MG tablet Commonly known as: ADVIL Take 800 mg by mouth every 6 (six) hours as needed. for pain   Lancet Device Misc 1 Device by Does not apply route daily.   levothyroxine 112 MCG tablet Commonly known as: SYNTHROID Take 1 tablet (112 mcg total) by mouth daily before breakfast.   ondansetron 4 MG disintegrating tablet Commonly known as: Zofran ODT Take 1 tablet (4 mg total) by mouth every 8 (eight) hours as needed for nausea or vomiting.   pantoprazole 40 MG tablet Commonly known as: PROTONIX TAKE 1 TABLET BY MOUTH ONCE DAILY   senna-docusate 8.6-50 MG tablet Commonly known as: Senokot-S Take by mouth.          OBJECTIVE:   PHYSICAL EXAM: VS: BP 122/78 (BP Location: Left Arm, Patient Position: Sitting, Cuff Size: Large)   Pulse 73   Ht 5\' 4"  (1.626 m)   Wt 277 lb (125.6 kg)   SpO2 97%   BMI 47.55 kg/m    EXAM: General: Pt appears well and is in NAD  Neck:  Thyroid: No goiter or nodules appreciated.  Lungs: Clear with good BS bilat   Heart: Auscultation: RRR.  Abdomen: soft, nontender  Extremities:  BL LE: No pretibial edema   Mental Status: Judgment, insight: Intact Orientation: Oriented to time, place, and person Mood and affect: No depression, anxiety, or agitation     DATA REVIEWED:   Latest Reference Range & Units 09/17/23 10:05  TSH 0.35 - 5.50 uIU/mL 0.59  T4,Free(Direct) 0.60 - 1.60 ng/dL 1.61     Thyroid Ultrasound 04/14/2022  Nodule # 1:   Prior biopsy: No   Location: Left; mid   Maximum size: 1.4 cm; Other 2 dimensions: 0.8 x 0.6 cm, previously, 2.5 x 1.7 x 1.7 cm    Composition: solid/almost completely solid (2)   Echogenicity: isoechoic (1)   Shape: not taller-than-wide (0)   Margins: ill-defined (0)   Echogenic foci: none (0)   ACR TI-RADS total points: 3.   ACR TI-RADS risk category:  TR3 (3 points).   Significant change in size (>/= 20% in two dimensions and minimal increase of 2 mm): No   Change in features: No   Change in ACR TI-RADS risk category: No   ACR TI-RADS recommendations:   Given decrease in size since  2017, this is consistent with a benign nodule.   _________________________________________________________   IMPRESSION: Nodule 1 (TI-RADS 3), located in the mid left thyroid lobe demonstrates decrease in size since 2017, consistent with a benign etiology. It does not require further imaging follow-up.   The above is in keeping with the ACR TI-RADS recommendations - J Am Coll Radiol 2017;14:587-595.      ASSESSMENT / PLAN / RECOMMENDATIONS:   Postablative Hypothyroidism:  -Patient is clinically euthyroid -No local neck symptoms -TFTs remain within normal range Medications   Continue levothyroxine 112 mcg daily  2. T2DM :  -Intolerant to metformin -She has tolerated Trulicity before gastric sleeve surgery in 2019 -I have recommended Mounjaro, if this is not covered by her insurance company we will consider Ozempic -Cautioned against GI side effects -Patient encouraged to keep up-to-date with her eye exams   Medication :  -Start Mounjaro 2.5 mg weekly    Follow-up in 4 months  Signed electronically by: Lyndle Herrlich, MD  [facility] Endocrinology  [facility] [address] [place] [place], [place] [address] Phone: [phone] FAX: [phone]      CC: Evette Georges, MD [address] [place] [address] Phone: [phone]  Fax: [phone]   Return to Endocrinology clinic as below: No future appointments.

## 2023-09-17 NOTE — Patient Instructions (Signed)
Start Mounjaro 2.5 mg once weekly

## 2023-12-13 ENCOUNTER — Encounter: Payer: Self-pay | Admitting: Family Medicine

## 2023-12-13 NOTE — Telephone Encounter (Signed)
 Care team updated and letter sent for eye exam notes.

## 2024-01-10 ENCOUNTER — Encounter: Payer: 59 | Admitting: Internal Medicine

## 2024-01-19 ENCOUNTER — Ambulatory Visit: Payer: 59 | Admitting: Internal Medicine

## 2024-02-21 ENCOUNTER — Ambulatory Visit: Payer: Managed Care, Other (non HMO) | Admitting: Internal Medicine

## 2024-02-21 ENCOUNTER — Encounter: Payer: Self-pay | Admitting: Internal Medicine

## 2024-02-21 VITALS — BP 136/84 | HR 86 | Ht 64.0 in | Wt 265.0 lb

## 2024-02-21 DIAGNOSIS — E119 Type 2 diabetes mellitus without complications: Secondary | ICD-10-CM | POA: Diagnosis not present

## 2024-02-21 DIAGNOSIS — E890 Postprocedural hypothyroidism: Secondary | ICD-10-CM | POA: Diagnosis not present

## 2024-02-21 LAB — POCT GLYCOSYLATED HEMOGLOBIN (HGB A1C): Hemoglobin A1C: 6.2 % — AB (ref 4.0–5.6)

## 2024-02-21 MED ORDER — LEVOTHYROXINE SODIUM 112 MCG PO TABS
112.0000 ug | ORAL_TABLET | Freq: Every day | ORAL | 3 refills | Status: DC
Start: 2024-02-21 — End: 2024-08-23

## 2024-02-21 NOTE — Progress Notes (Signed)
 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 is a 48 y.o., female with a past medical history of HTN, asthma, hypothyroid,Hx of gastric sleeve 09/2018  and DM. She has followed with [facility] since 04/22/2016 for consultative assistance with management of her post ablative hypothyroid.   HISTORICAL SUMMARY: The patient was first diagnosed with hyperthyroidism in 2017.    She has been diagnosed with multinodular goiter since 2002.  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  She did experience flulike symptoms after the first injection, she did have similar experience with Trulicity   SUBJECTIVE:    During the last visit (09/17/2023): A1c 6.6%  Today (02/21/24): Anna Hawkins is here for follow-up on diabetes and thyroid management.  She checks blood sugars rarely. The patient has not had hypoglycemic episodes since the last clinic visit.    She has not taken Mounjaro in a few months, due to  losing her job . She did endorse  flu like symptoms after the injection. Had similar symptoms of Trulicity    Patient has been noted with weight loss, that she attributes to joining a  line dance class  Denies local neck swelling  Denies constipation or diarrhea  Denies palpitations  No biotin     HOME ENDOCRINE REGIMEN:  Mounjaro 2.5 mg weekly- not taking  Levothyroxine 112 mcg daily    Statin: no ACE-I/ARB: no  METER DOWNLOAD SUMMARY: n/a    DIABETIC COMPLICATIONS: Microvascular complications:   Denies: CKD Last Eye Exam: Completed   Macrovascular complications:   Denies: CAD, CVA, PVD     HISTORY:  Past Medical History:  Past Medical History:  Diagnosis Date   Asthma    Chlamydia    Fatty liver    Graves disease    History of shoulder dystocia in prior pregnancy    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 She has never used smokeless tobacco. She reports that she does not drink alcohol and does not use drugs. Family History:  Family History  Problem Relation Age of Onset   Diabetes Mother    Hypertension Mother    Asthma Mother    Thyroid disease Mother  Diabetes Father    Cancer Father    Asthma Father    Hypertension Father    Diabetes Brother    Hypertension Brother    Asthma Brother    Colon cancer Neg Hx    Esophageal cancer Neg Hx    Stomach cancer Neg Hx    Rectal cancer Neg Hx      HOME MEDICATIONS: Allergies as of 02/21/2024       Reactions   Lisinopril Rash, Cough        Medication List        Accurate as of February 21, 2024  9:12 AM. If you have any questions, ask your nurse or doctor.          accu-chek multiclix lancets Use as instructed   albuterol 108 (90 Base) MCG/ACT inhaler Commonly  known as: VENTOLIN HFA Inhale 2 puffs into the lungs every 6 (six) hours as needed for wheezing or shortness of breath.   Anoro Ellipta 62.5-25 MCG/ACT Aepb Generic drug: umeclidinium-vilanterol Inhale 1 puff into the lungs daily.   blood glucose meter kit and supplies Kit Dispense based on patient and insurance preference. Use up to four times daily as directed. (FOR ICD-9 250.00, 250.01).   cetirizine 10 MG tablet Commonly known as: ZYRTEC Take 1 tablet (10 mg total) by mouth daily.   fluticasone 50 MCG/ACT nasal spray Commonly known as: FLONASE 2 sprays daily as needed for rhinitis.   fluticasone 50 MCG/ACT nasal spray Commonly known as: FLONASE Place 2 sprays into both nostrils daily.   fluticasone-salmeterol 100-50 MCG/ACT Aepb Commonly known as: ADVAIR Inhale 1 puff into the lungs 2 (two) times daily.   folic acid 1 MG tablet Commonly known as: FOLVITE Take 1 tablet (1 mg total) by mouth daily.   glucose blood test strip Use as instructed   ibuprofen 800 MG tablet Commonly known as: ADVIL Take 800 mg by mouth every 6 (six) hours as needed. for pain   Lancet Device Misc 1 Device by Does not apply route daily.   levothyroxine 112 MCG tablet Commonly known as: SYNTHROID Take 1 tablet (112 mcg total) by mouth daily before breakfast.   ondansetron 4 MG disintegrating tablet Commonly known as: Zofran ODT Take 1 tablet (4 mg total) by mouth every 8 (eight) hours as needed for nausea or vomiting.   pantoprazole 40 MG tablet Commonly known as: PROTONIX TAKE 1 TABLET BY MOUTH ONCE DAILY   senna-docusate 8.6-50 MG tablet Commonly known as: Senokot-S Take by mouth.   tirzepatide 2.5 MG/0.5ML Pen Commonly known as: MOUNJARO Inject 2.5 mg into the skin once a week.          OBJECTIVE:   PHYSICAL EXAM: VS: BP 136/84 (BP Location: Right Arm, Patient Position: Sitting, Cuff Size: Normal)   Pulse 86   Ht 5\' 4"  (1.626 m)   Wt 265 lb (120.2 kg)   SpO2 96%    BMI 45.49 kg/m    EXAM: General: Pt appears well and is in NAD  Neck:  Thyroid: No goiter or nodules appreciated.  Lungs: Clear with good BS bilat   Heart: Auscultation: RRR.  Extremities:  BL LE: No pretibial edema   Mental Status: Judgment, insight: Intact Orientation: Oriented to time, place, and person Mood and affect: No depression, anxiety, or agitation   Dm Foot Exam 02/21/2024  The skin of the feet is intact without sores or ulcerations. The pedal pulses are 2+ on right and 2+ on left. The sensation is  intact to a screening 5.07, 10 gram monofilament bilaterally   DATA REVIEWED:   Latest Reference Range & Units 09/17/23 10:05  TSH 0.35 - 5.50 uIU/mL 0.59  T4,Free(Direct) 0.60 - 1.60 ng/dL 7.82    Latest Reference Range & Units 09/10/23 14:40  Total CHOL/HDL Ratio 0.0 - 4.4 ratio 2.5  Cholesterol, Total 100 - 199 mg/dL 956  HDL Cholesterol >21 mg/dL 68  MICROALB/CREAT RATIO 0 - 29 mg/g creat 9  Triglycerides 0 - 149 mg/dL 76  VLDL Cholesterol Cal 5 - 40 mg/dL 14  LDL Chol Calc (NIH) 0 - 99 mg/dL 88    Thyroid Ultrasound 04/14/2022  Nodule # 1:   Prior biopsy: No   Location: Left; mid   Maximum size: 1.4 cm; Other 2 dimensions: 0.8 x 0.6 cm, previously, 2.5 x 1.7 x 1.7 cm   Composition: solid/almost completely solid (2)   Echogenicity: isoechoic (1)   Shape: not taller-than-wide (0)   Margins: ill-defined (0)   Echogenic foci: none (0)   ACR TI-RADS total points: 3.   ACR TI-RADS risk category:  TR3 (3 points).   Significant change in size (>/= 20% in two dimensions and minimal increase of 2 mm): No   Change in features: No   Change in ACR TI-RADS risk category: No   ACR TI-RADS recommendations:   Given decrease in size since 2017, this is consistent with a benign nodule.   _________________________________________________________   IMPRESSION: Nodule 1 (TI-RADS 3), located in the mid left thyroid lobe demonstrates decrease in size  since 2017, consistent with a benign etiology. It does not require further imaging follow-up.   The above is in keeping with the ACR TI-RADS recommendations - J Am Coll Radiol 2017;14:587-595.      ASSESSMENT / PLAN / RECOMMENDATIONS:    1.Type 2 Diabetes Mellitus, Optimally controlled, Without complications - Most recent A1c of 6.2 %. Goal A1c < 7.0 %.    -I have placed the patient on weight loss and improving A1c without any glycemic agents -I encouraged the patient to continue with line dancing and low-carb diet -We have opted to remain off any glycemic agents at this time.  She did experience flulike symptoms after injections with Mounjaro and Trulicity but these are mild and tolerable    MEDICATIONS: N/a  EDUCATION / INSTRUCTIONS: BG monitoring instructions: Patient is instructed to check her blood sugars 2-3 times a week. Call [facility] if: BG persistently < 70  I reviewed the Rule of 15 for the treatment of hypoglycemia in detail with the patient. Literature supplied.    2. Diabetic complications:  Eye: Does not have known diabetic retinopathy.  Neuro/ Feet: Does not have known diabetic peripheral neuropathy .  Renal: Patient does not have known baseline CKD. She  is not on an ACEI/ARB at present.     3.Postablative Hypothyroidism:  -Patient is clinically euthyroid -No local neck symptoms -TFTs remain within normal range   Medications   Continue levothyroxine 112 mcg daily   Follow-up in 6 months  Signed electronically by: Lyndle Herrlich, MD  [facility]  [facility] [address] [place], [place] [address] Phone: [phone] FAX: [phone]      CC: Evette Georges, MD [address] [place] [address] Phone: [phone]  Fax: [phone]   Return to Endocrinology clinic as below: No future appointments.

## 2024-03-15 ENCOUNTER — Other Ambulatory Visit: Payer: Self-pay | Admitting: Family Medicine

## 2024-03-16 ENCOUNTER — Other Ambulatory Visit: Payer: Self-pay | Admitting: Family Medicine

## 2024-05-13 ENCOUNTER — Other Ambulatory Visit: Payer: Self-pay | Admitting: Family Medicine

## 2024-05-18 NOTE — Progress Notes (Signed)
    SUBJECTIVE:   CHIEF COMPLAINT / HPI:   Asthma follow up Has been taking an inhaled Anoro Ellipta  along with Advair.  She has also been taking Zyrtec  and Flonase  for allergies. Most of the time, this regimen works for her, but when it is muggy outside, the asthma flares up.  Weight loss, sleep concerns Was on Mounjaro  previously prescribed by her endocrinologist. Her endocrinologist stopped the medication given an improved A1c level. She is interested in another medication to help with this. She notes she snores, has non-restorative sleep, has awoken gasping for air. She thought these symptoms were due to asthma alone. She has a history of sleeve gastrectomy.  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 She cannot tolerate oral iron. Last period in 2023, so no heavy menses. No other bleeding.  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  Will likely need IV iron infusion outpatient. Type 2 diabetes mellitus without complication, without long-term current use of insulin  (HCC) Controlled.  Managed mainly by endocrinology at this point. Sleep concern High risk STOP-BANG score.  Will proceed with sleep study today. Moderate persistent asthma, unspecified whether complicated Controlled on Advair with Anoro; however,  patient is on duplicate LABA therapy.  Will switch over to Breztri  for 1 inhaler medication regimen.  Advised to let me know how she does with this. Healthcare maintenance Patient politely declines pneumococcal and COVID-19 vaccines today.  She will go to her eye doctor for her diabetic eye exam.  She signed a release of information form to obtain her Pap smear result from [facility] in [place].  Genetta Kenning, MD [facility]

## 2024-05-19 ENCOUNTER — Encounter: Payer: Self-pay | Admitting: Family Medicine

## 2024-05-19 ENCOUNTER — Ambulatory Visit (INDEPENDENT_AMBULATORY_CARE_PROVIDER_SITE_OTHER): Admitting: Family Medicine

## 2024-05-19 VITALS — BP 161/97 | HR 77 | Ht 64.0 in | Wt 273.8 lb

## 2024-05-19 DIAGNOSIS — Z7689 Persons encountering health services in other specified circumstances: Secondary | ICD-10-CM

## 2024-05-19 DIAGNOSIS — J4540 Moderate persistent asthma, uncomplicated: Secondary | ICD-10-CM | POA: Diagnosis not present

## 2024-05-19 DIAGNOSIS — K9189 Other postprocedural complications and disorders of digestive system: Secondary | ICD-10-CM

## 2024-05-19 DIAGNOSIS — D508 Other iron deficiency anemias: Secondary | ICD-10-CM

## 2024-05-19 DIAGNOSIS — E119 Type 2 diabetes mellitus without complications: Secondary | ICD-10-CM

## 2024-05-19 DIAGNOSIS — Z0000 Encounter for general adult medical examination without abnormal findings: Secondary | ICD-10-CM

## 2024-05-19 HISTORY — DX: Encounter for general adult medical examination without abnormal findings: Z00.00

## 2024-05-19 MED ORDER — BREZTRI AEROSPHERE 160-9-4.8 MCG/ACT IN AERO: 2.0000 | INHALATION_SPRAY | Freq: Two times a day (BID) | RESPIRATORY_TRACT | 1 refills | Status: DC

## 2024-05-19 NOTE — Patient Instructions (Signed)
 We obtained blood work today. I will let you know results.  We will get a sleep study to check on your sleep.  I have sent in one asthma medication to use to help with asthma control.  Let me know when you have seen the eye doctor for your diabetes eye exam.

## 2024-05-19 NOTE — Assessment & Plan Note (Signed)
 Controlled on Advair with Anoro; however, patient is on duplicate LABA therapy.  Will switch over to Breztri  for 1 inhaler medication regimen.  Advised to let me know how she does with this.

## 2024-05-19 NOTE — Assessment & Plan Note (Signed)
 High risk STOP-BANG score.  Will proceed with sleep study today.

## 2024-05-19 NOTE — Assessment & Plan Note (Signed)
 Patient politely declines pneumococcal and COVID-19 vaccines today.  She will go to her eye doctor for her diabetic eye exam.  She signed a release of information form to obtain her Pap smear result from [facility] in [place].

## 2024-05-19 NOTE — Assessment & Plan Note (Signed)
 Will update iron studies and CBC today given patient is unable to tolerate oral iron.  Will likely need IV iron infusion outpatient.

## 2024-05-19 NOTE — Assessment & Plan Note (Signed)
 Controlled.  Managed mainly by endocrinology at this point.

## 2024-05-20 ENCOUNTER — Ambulatory Visit: Payer: Self-pay | Admitting: Family Medicine

## 2024-05-20 DIAGNOSIS — K9189 Other postprocedural complications and disorders of digestive system: Secondary | ICD-10-CM

## 2024-05-20 LAB — CBC WITH DIFFERENTIAL/PLATELET
Basophils Absolute: 0 10*3/uL (ref 0.0–0.2)
Basos: 1 %
EOS (ABSOLUTE): 0.5 10*3/uL — ABNORMAL HIGH (ref 0.0–0.4)
Eos: 8 %
Hematocrit: 39.4 % (ref 34.0–46.6)
Hemoglobin: 12.1 g/dL (ref 11.1–15.9)
Immature Grans (Abs): 0 10*3/uL (ref 0.0–0.1)
Immature Granulocytes: 0 %
Lymphocytes Absolute: 2.6 10*3/uL (ref 0.7–3.1)
Lymphs: 44 %
MCH: 23.9 pg — ABNORMAL LOW (ref 26.6–33.0)
MCHC: 30.7 g/dL — ABNORMAL LOW (ref 31.5–35.7)
MCV: 78 fL — ABNORMAL LOW (ref 79–97)
Monocytes Absolute: 0.4 10*3/uL (ref 0.1–0.9)
Monocytes: 6 %
Neutrophils Absolute: 2.4 10*3/uL (ref 1.4–7.0)
Neutrophils: 41 %
Platelets: 280 10*3/uL (ref 150–450)
RBC: 5.07 x10E6/uL (ref 3.77–5.28)
RDW: 14.4 % (ref 11.7–15.4)
WBC: 5.9 10*3/uL (ref 3.4–10.8)

## 2024-05-20 LAB — BASIC METABOLIC PANEL WITH GFR
BUN/Creatinine Ratio: 11 (ref 9–23)
BUN: 10 mg/dL (ref 6–24)
CO2: 25 mmol/L (ref 20–29)
Calcium: 9.6 mg/dL (ref 8.7–10.2)
Chloride: 103 mmol/L (ref 96–106)
Creatinine, Ser: 0.91 mg/dL (ref 0.57–1.00)
Glucose: 109 mg/dL — ABNORMAL HIGH (ref 70–99)
Potassium: 4.2 mmol/L (ref 3.5–5.2)
Sodium: 142 mmol/L (ref 134–144)
eGFR: 78 mL/min/{1.73_m2} (ref >59)

## 2024-05-20 LAB — IRON,TIBC AND FERRITIN PANEL
Ferritin: 13 ng/mL — ABNORMAL LOW (ref 15–150)
Iron Saturation: 22 % (ref 15–55)
Iron: 97 ug/dL (ref 27–159)
Total Iron Binding Capacity: 445 ug/dL (ref 250–450)
UIBC: 348 ug/dL (ref 131–425)

## 2024-05-22 ENCOUNTER — Telehealth: Payer: Self-pay

## 2024-05-22 ENCOUNTER — Telehealth: Payer: Self-pay | Admitting: Family Medicine

## 2024-05-22 DIAGNOSIS — D508 Other iron deficiency anemias: Secondary | ICD-10-CM

## 2024-05-22 HISTORY — DX: Other iron deficiency anemias: D50.8

## 2024-05-22 NOTE — Telephone Encounter (Signed)
 Auth Submission: NO AUTH NEEDED Site of care: Site of care: [facility] Payer: [employer] medicaid Medication & CPT/J Code(s) submitted: Feraheme (ferumoxytol) [id number] Route of submission (phone, fax, portal):  Phone # Fax # Auth type: Buy/Bill PB Units/visits requested: 510mg  x 2 doses Reference number:  Approval from: 05/22/24 to 09/21/24

## 2024-05-22 NOTE — Telephone Encounter (Signed)
 Patient referred to infusion pharmacy team for ambulatory infusion of IV iron.  Insurance - Medical sales representative  Dx code - K91.89/D50.8  IV Iron Therapy - Feraheme 510 mg IV x 2 Infusion appointments - [facility] Scheduling team will schedule patient as soon as possible.   Chanta Bauers D. Depaul Arizpe, PharmD

## 2024-05-23 ENCOUNTER — Ambulatory Visit (INDEPENDENT_AMBULATORY_CARE_PROVIDER_SITE_OTHER)

## 2024-05-23 VITALS — BP 137/81 | HR 72 | Temp 97.9°F | Resp 16 | Ht 64.0 in | Wt 275.4 lb

## 2024-05-23 DIAGNOSIS — D508 Other iron deficiency anemias: Secondary | ICD-10-CM | POA: Diagnosis not present

## 2024-05-23 DIAGNOSIS — Z9884 Bariatric surgery status: Secondary | ICD-10-CM

## 2024-05-23 MED ORDER — SODIUM CHLORIDE 0.9 % IV SOLN
510.0000 mg | Freq: Once | INTRAVENOUS | Status: AC
  Administered 2024-05-23: 510 mg via INTRAVENOUS
  Filled 2024-05-23: qty 17

## 2024-05-23 NOTE — Progress Notes (Signed)
 Diagnosis: Iron Deficiency Anemia  Provider:  Praveen Mannam MD  Procedure: IV Infusion  IV Type: Peripheral, IV Location: R Antecubital  Feraheme (Ferumoxytol), Dose: 510 mg  Infusion Start Time: 1011  Infusion Stop Time: 1023  Post Infusion IV Care: Observation period completed and Peripheral IV Discontinued  Discharge: Condition: Good, Destination: Home . AVS Declined  Performed by:  Star East, LPN

## 2024-05-25 ENCOUNTER — Encounter: Payer: Self-pay | Admitting: Pulmonary Disease

## 2024-05-28 ENCOUNTER — Encounter: Payer: Self-pay | Admitting: Family Medicine

## 2024-05-29 MED ORDER — INCRUSE ELLIPTA 62.5 MCG/ACT IN AEPB: 1.0000 | INHALATION_SPRAY | Freq: Every day | RESPIRATORY_TRACT | 0 refills | Status: DC

## 2024-05-29 MED ORDER — FLUTICASONE-SALMETEROL 100-50 MCG/ACT IN AEPB: 1.0000 | INHALATION_SPRAY | Freq: Two times a day (BID) | RESPIRATORY_TRACT | 3 refills | Status: DC

## 2024-05-29 NOTE — Telephone Encounter (Signed)
 Switched Breztri  to Advair and Incruse given insurance difficulties.

## 2024-05-30 ENCOUNTER — Encounter: Payer: Self-pay | Admitting: Pulmonary Disease

## 2024-05-30 ENCOUNTER — Ambulatory Visit (INDEPENDENT_AMBULATORY_CARE_PROVIDER_SITE_OTHER)

## 2024-05-30 VITALS — BP 140/80 | HR 75 | Temp 98.3°F | Resp 16 | Ht 64.0 in | Wt 273.4 lb

## 2024-05-30 DIAGNOSIS — D508 Other iron deficiency anemias: Secondary | ICD-10-CM | POA: Diagnosis not present

## 2024-05-30 DIAGNOSIS — Z9884 Bariatric surgery status: Secondary | ICD-10-CM

## 2024-05-30 MED ORDER — SODIUM CHLORIDE 0.9 % IV SOLN
510.0000 mg | Freq: Once | INTRAVENOUS | Status: AC
  Administered 2024-05-30: 510 mg via INTRAVENOUS
  Filled 2024-05-30: qty 17

## 2024-05-30 NOTE — Progress Notes (Signed)
 Diagnosis: Iron Deficiency Anemia  Provider:  Praveen Mannam MD  Procedure: IV Infusion  IV Type: Peripheral, IV Location: R Antecubital  Feraheme (Ferumoxytol ), Dose: 510 mg  Infusion Start Time: 1156  Infusion Stop Time: 1216  Post Infusion IV Care: Observation period completed and Peripheral IV Discontinued  Discharge: Condition: Good, Destination: Home . AVS Declined  Performed by:  Lauran Pollard, LPN

## 2024-06-06 ENCOUNTER — Other Ambulatory Visit (HOSPITAL_COMMUNITY): Payer: Self-pay

## 2024-06-06 ENCOUNTER — Encounter: Payer: Self-pay | Admitting: Family Medicine

## 2024-06-17 ENCOUNTER — Other Ambulatory Visit: Payer: Self-pay | Admitting: Family Medicine

## 2024-07-08 ENCOUNTER — Other Ambulatory Visit: Payer: Self-pay | Admitting: Family Medicine

## 2024-07-10 ENCOUNTER — Other Ambulatory Visit: Payer: Self-pay | Admitting: Family Medicine

## 2024-08-10 ENCOUNTER — Ambulatory Visit (HOSPITAL_BASED_OUTPATIENT_CLINIC_OR_DEPARTMENT_OTHER): Attending: Family Medicine | Admitting: Internal Medicine

## 2024-08-10 DIAGNOSIS — G4733 Obstructive sleep apnea (adult) (pediatric): Secondary | ICD-10-CM | POA: Diagnosis not present

## 2024-08-10 DIAGNOSIS — Z7689 Persons encountering health services in other specified circumstances: Secondary | ICD-10-CM | POA: Diagnosis present

## 2024-08-20 NOTE — Procedures (Signed)
 [facility] [address] [place], [place] [address] Tel: [phone]   Fax: [phone]  Polysomnography Interpretation  Patient Name:  Anna Hawkins, Anna Hawkins Date:  08/10/2024 Referring Physician:  LAYMON LEGIONS [id number]) %%startinterp%% Indications for Polysomnography The patient is a 48 year-old Female who is 5' 5 and weighs 271.0 lbs. Her BMI equals 45.2.  A full night polysomnogram was performed to evaluate for -.OSA  Medication was administered at 2120.  Advair   Polysomnogram Data A full night polysomnogram recorded the standard physiologic parameters including EEG, EOG, EMG, EKG, nasal and oral airflow.  Respiratory parameters of chest and abdominal movements were recorded with Respiratory Inductance Plethysmography belts.  Oxygen saturation was recorded by pulse oximetry.   Sleep Architecture The total recording time of the polysomnogram was 443.5 minutes.  The total sleep time was 406.5 minutes.  The patient spent 2.5% of total sleep time in Stage N1, 82.0% in Stage N2, 0.0% in Stages N3, and 15.5% in REM.  Sleep latency was 18.6 minutes.  REM latency was 97.5 minutes.  Sleep Efficiency was 91.7%.  Wake after Sleep Onset time was 18.0 minutes.  Respiratory Events The polysomnogram revealed a presence of - obstructive, - central, and - mixed apneas resulting in an Apnea index of - events per hour.  There were 112 hypopneas (>=3% desaturation and/or arousal) resulting in an Apnea\Hypopnea Index (AHI >=3% desaturation and/or arousal) of 16.5 events per hour.  There were 73 hypopneas (>=4% desaturation) resulting in an Apnea\Hypopnea Index (AHI >=4% desaturation) of 10.8 events per hour.  There were - Respiratory Effort Related Arousals resulting in a RERA index of - events per hour. The Respiratory Disturbance Index is 16.5 events per hour.  The snore index was - events per hour.  Mean oxygen saturation was 91.1%.  The lowest oxygen  saturation during sleep was 76.0%.  Time spent <=88% oxygen saturation was 61.1 minutes (13.8%).  Limb Activity There were - total limb movements recorded, of this total, - were classified as PLMs.  PLM index was - per hour and PLM associated with Arousals index was - per hour.  Cardiac Summary The average pulse rate was 78.3 bpm.  The minimum pulse rate was 69.0 bpm while the maximum pulse rate was 97.0 bpm.  Cardiac rhythm was normal- PVCs.  Comments: Moderate obstructive sleep apnea, AHI(3%) 16.5/hr. Snoring with oxygen desaturation to a nadir of 76%, mean 91.1%. Insufficient early events and sleep to meet protocol requirements for split CPAP titration.  Diagnosis: Obstructive sleep apnea.  Recommendations: Suggest autopap (such as 5-15 cwp), CPAP titration sleep study, or fitted oral appliance.   This study was personally reviewed and electronically signed by: Reggy Salt, MD Accredited Board Certified in Sleep Medicine Date/Time: 08/20/24   12:02    %%endinterp%%   Diagnostic PSG Report  Patient Name: Anna Hawkins, Anna Hawkins Study Date: 08/10/2024  Date of Birth: Jan 20, 1976 Study Type: Diagnostic  Age: 24 year MRN #: [id number]  Sex: Female Interpreting Physician: SALT REGGY, [id number]  Height: 5' 5 Referring Physician: LAYMON LEGIONS [id number])  Weight: 271.0 lbs Recording Tech: Firman Anon RPSGT RST  BMI: 45.2 Scoring Tech: Firman Anon RPSGT RST  ESS: 6 Neck Size: 14   Study Overview  Lights Off: 09:23:42 PM  Count Index  Lights On: 04:47:10 AM Awakenings: 13 1.9  Time in Bed: 443.5 min. Arousals: 29 4.3  Total Sleep Time: 406.5 min. AHI (>=3% Desat and/or Ar.): 112 16.5   Sleep Efficiency: 91.7%  AHI (>=4% Desat): 73 10.8   Sleep Latency: 18.6 min. Limb Movements: - -  Wake After Sleep Onset: 18.0 min. Snore: - -  REM Latency from Sleep Onset: 97.5 min. Desaturations: 112 16.5     Minimum SpO2 TST: 76.0%    Sleep Architecture  % of Time in Bed Stages Time (mins)  % Sleep Time  Wake 37.0   Stage N1 10.0 2.5%  Stage N2 333.5 82.0%  Stage N3 0.0 0.0%  REM 63.0 15.5%   Arousal Summary   NREM REM Sleep Index  Respiratory Arousals 4 3 7  1.0  PLM Arousals - - - -  Isolated Limb Movement Arousals - - - -  Snore Arousals - - - -  Spontaneous Arousals 18 4 22  3.2  Total 22 7 29  4.3   Limb Movement Summary   Count Index  Isolated Limb Movements - -  Periodic Limb Movements (PLMs) - -  Total Limb Movements - -    Respiratory Summary   By Sleep Stage By Body Position Total   NREM REM Supine Non-Supine   Time (min) 343.5 63.0 357.5 49.0 406.5         Obstructive Apnea - - - - -  Mixed Apnea - - - - -  Central Apnea - - - - -  Total Apneas - - - - -  Total Apnea Index - - - - -         Hypopneas (>=3% Desat and/or Ar.) 69 43 111 1 112  AHI (>=3% Desat and/or Ar.) 12.1 41.0 18.6 1.2 16.5         Hypopneas (>=4% Desat) 37 36 73 - 73  AHI (>=4% Desat) 6.5 34.3 12.3 - 10.8          RERAs - - - - -  RERA Index - - - - -         RDI 12.1 41.0 18.6 1.2 16.5    Respiratory Event Type Index  Central Apneas -  Obstructive Apneas -  Mixed Apneas -  Central Hypopneas -  Obstructive Hypopneas -  Central Apnea + Hypopnea (CAHI) -  Obstructive Apnea + Hypopnea (OAHI) 16.5   Respiratory Event Durations   Apnea Hypopnea   NREM REM NREM REM  Average (seconds) - - 31.1 28.6  Maximum (seconds) - - 85.3 73.0    Oxygen Saturation Summary   Wake NREM REM TST TIB  Average SpO2 (%) 95.1% 90.8% 90.0% 90.7% 91.1%  Minimum SpO2 (%) 89.0% 76.0% 80.0% 76.0% 76.0%  Maximum SpO2 (%) 99.0% 97.0% 96.0% 97.0% 99.0%   Oxygen Saturation Distribution  Range (%) Time in range (min) Time in range (%)  90.0 - 100.0 270.6 61.0%  80.0 - 90.0 172.4 38.9%  70.0 - 80.0 0.6 0.1%  60.0 - 70.0 - -  50.0 - 60.0 - -  0.0 - 50.0 - -  Time Spent <=88% SpO2  Range (%) Time in range (min) Time in range (%)  0.0 - 88.0 61.1 13.8%      Count Index   Desaturations 112 16.5    Cardiac Summary   Wake NREM REM Sleep Total  Average Pulse Rate (BPM) 81.9 77.5 80.4 78.0 78.3  Minimum Pulse Rate (BPM) 72.0 69.0 71.0 69.0 69.0  Maximum Pulse Rate (BPM) 97.0 86.0 86.0 86.0 97.0   Pulse Rate Distribution:  Range (bpm) Time in range (min) Time in range (%)  0.0 - 40.0 - -  40.0 - 60.0 - -  60.0 - 80.0 346.0 77.9%  80.0 - 100.0 98.0 22.1%  100.0 - 120.0 - -  120.0 - 140.0 - -  140.0 - 200.0 - -      Hypnograms                      Technologist Comments  THE 56-YEAR-OLD FEMALE PATIENT PRESENTED TO THE SLEEP DISORDER CENTER FOR A SPLIT NIGHT STUDY WITH A CHIEF COMPLAINT OF SLEEP CONCERN. ALL BEDTIME MEDICATIONS WERE SELF ADMINISTERED AT 2120. THE PATIENT WAS FITTED WITH A SMALL/ MEDIUM [employer]ER [employer] PAYKEL EVORA FULL FACE MASK FOR PRE-CPAP TRIAL, WHICH WAS TOLERATED WELL. THE LEAD PLACEMENT WAS INITIATED, THEN THE STUDY WAS BEGUN. SUPPLEMENTAL OXYGEN WAS NOT WARRANTED DURING THE STUDY. MODERATE TO LOUD SNORING WAS NOTED THROUGHOUT THE STUDY. NO PLMs - PLMAs WERE NOTED. OBVIOUS CARDIAC ARRHYTHMIAS WERE OBSERVED ON EPOCH 381 AND 411. NO RESTROOM VISITS WERE MADE. NO OBVIOUS PARASOMNIAS WERE OBSERVED. SPLIT NIGHT CRITERIA WAS NOT MET DUE TO INSUFFICIENT AMOUNT OF RESPIRAORY EVENTS, THEREFORE A NPSG DIAGNOSTIC STUDY WAS CONTINUED. THE PATIENT TOLERATED THE NPSG DIAGNOSTIC STUDY VERY WELL.                           Reggy Salt Diplomate, Biomedical engineer of Sleep Medicine  ELECTRONICALLY SIGNED ON:  08/20/2024, 11:56 AM [facility] PH: [phone]   FX: [phone] ACCREDITED BY THE AMERICAN ACADEMY OF SLEEP MEDICINE

## 2024-08-20 NOTE — Procedures (Signed)
  Indications for Polysomnography The patient is a 48 year-old Female who is 5' 5 and weighs 271.0 lbs. Her BMI equals 45.2.  A full night polysomnogram was performed to evaluate for -.  Medication was administered at 2120.[facility]air Polysomnogram Data A full night polysomnogram recorded the standard physiologic parameters including EEG, EOG, EMG, EKG, nasal and oral airflow.  Respiratory parameters of chest and abdominal movements were recorded with Respiratory Inductance Plethysmography belts.   Oxygen saturation was recorded by pulse oximetry.  Sleep Architecture The total recording time of the polysomnogram was 443.5 minutes.  The total sleep time was 406.5 minutes.  The patient spent 2.5% of total sleep time in Stage N1, 82.0% in Stage N2, 0.0% in Stages N3, and 15.5% in REM.  Sleep latency was 18.6 minutes.   REM latency was 97.5 minutes.  Sleep Efficiency was 91.7%.  Wake after Sleep Onset time was 18.0 minutes.  Respiratory Events The polysomnogram revealed a presence of - obstructive, - central, and - mixed apneas resulting in an Apnea index of - events per hour.  There were 112 hypopneas (GreaterEqual to3% desaturation and/or arousal) resulting in an Apnea\Hypopnea Index (AHI  GreaterEqual to3% desaturation and/or arousal) of 16.5 events per hour.  There were 73 hypopneas (GreaterEqual to4% desaturation) resulting in an Apnea\Hypopnea Index (AHI GreaterEqual to4% desaturation) of 10.8 events per hour.  There were - Respiratory  Effort Related Arousals resulting in a RERA index of - events per hour. The Respiratory Disturbance Index is 16.5 events per hour.  The snore index was - events per hour.  Mean oxygen saturation was 91.1%.  The lowest oxygen saturation during sleep was 76.0%.  Time spent LessEqual to88% oxygen saturation was  minutes ().  Limb Activity There were - total limb movements recorded, of this total, - were classified as PLMs.  PLM index was - per hour and PLM associated  with Arousals index was - per hour.  Cardiac Summary The average pulse rate was 78.3 bpm.  The minimum pulse rate was 69.0 bpm while the maximum pulse rate was 97.0 bpm.  Cardiac rhythm was normal/abnormal.  Comments:  Diagnosis:  Recommendations:   This study was personally reviewed and electronically signed by: Reggy Salt, MD Accredited Board Certified in Sleep Medicine Date/Time:

## 2024-08-22 ENCOUNTER — Ambulatory Visit (INDEPENDENT_AMBULATORY_CARE_PROVIDER_SITE_OTHER): Admitting: Internal Medicine

## 2024-08-22 ENCOUNTER — Encounter: Payer: Self-pay | Admitting: Internal Medicine

## 2024-08-22 VITALS — BP 126/84 | HR 81 | Ht 63.0 in | Wt 273.6 lb

## 2024-08-22 DIAGNOSIS — E119 Type 2 diabetes mellitus without complications: Secondary | ICD-10-CM | POA: Diagnosis not present

## 2024-08-22 DIAGNOSIS — Z7985 Long-term (current) use of injectable non-insulin antidiabetic drugs: Secondary | ICD-10-CM | POA: Diagnosis not present

## 2024-08-22 DIAGNOSIS — E890 Postprocedural hypothyroidism: Secondary | ICD-10-CM | POA: Diagnosis not present

## 2024-08-22 NOTE — Patient Instructions (Signed)

## 2024-08-22 NOTE — Progress Notes (Unsigned)
 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 is a 48 y.o., female with a past medical history of HTN, asthma, hypothyroid,Hx of gastric sleeve 09/2018  and DM. She has followed with [facility] since 04/22/2016 for consultative assistance with management of her post ablative hypothyroid.   HISTORICAL SUMMARY: The patient was first diagnosed with hyperthyroidism in 2017.    She has been diagnosed with multinodular goiter since 2002.  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  She did experience flulike symptoms with the  first injection, she did have similar experience with Trulicity   Her A1c 6.2% at the time and we opted to hold of on glycemic agents   SUBJECTIVE:    During the last visit (02/21/2024): A1c 6.2%    Today (08/22/24): Anna Hawkins is here for follow-up on diabetes and thyroid  management.  She has not been checking glucose at home   She did meet with her PCP and discussed weight management, referred her for sleep study. She is s/p gastric sleeve surgery in 2019 but has noted weight regain, she did talk to her PCP about Wegovy No nausea or vomiting  Has  occasional diarrhea  No Palpitations  No local neck swelling  No tremors  No biotin     HOME ENDOCRINE REGIMEN:  Levothyroxine  112 mcg daily    Statin: no ACE-I/ARB: no  METER DOWNLOAD SUMMARY: n/a    DIABETIC COMPLICATIONS: Microvascular complications:   Denies: CKD Last Eye Exam: Completed   Macrovascular complications:   Denies: CAD, CVA, PVD     HISTORY:  Past Medical History:  Past Medical History:  Diagnosis Date   Asthma    Chlamydia    Fatty liver    Graves disease    History of shoulder dystocia in prior pregnancy    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 She has never used smokeless tobacco. She reports that she does not drink alcohol and does not use drugs. Family History:  Family History  Problem Relation Age of Onset   Diabetes Mother    Hypertension Mother    Asthma Mother    Thyroid  disease Mother    Diabetes Father  Cancer Father    Asthma Father    Hypertension Father    Diabetes Brother    Hypertension Brother    Asthma Brother    Colon cancer Neg Hx    Esophageal cancer Neg Hx    Stomach cancer Neg Hx    Rectal cancer Neg Hx      HOME MEDICATIONS: Allergies as of 08/22/2024       Reactions   Lisinopril Rash, Cough        Medication List        Accurate as of August 22, 2024  7:41 AM. If you have any questions, ask your nurse or doctor.          accu-chek multiclix lancets Use as instructed   albuterol  108 (90 Base) MCG/ACT inhaler Commonly known as: VENTOLIN  HFA Inhale 2 puffs into the lungs every  6 (six) hours as needed for wheezing or shortness of breath.   blood glucose meter kit and supplies Kit Dispense based on patient and insurance preference. Use up to four times daily as directed. (FOR ICD-9 250.00, 250.01).   cetirizine  10 MG tablet Commonly known as: ZYRTEC  Take 1 tablet (10 mg total) by mouth daily.   fluticasone  50 MCG/ACT nasal spray Commonly known as: FLONASE  2 sprays daily as needed for rhinitis.   fluticasone  50 MCG/ACT nasal spray Commonly known as: FLONASE  Place 2 sprays into both nostrils daily.   fluticasone -salmeterol 100-50 MCG/ACT Aepb Commonly known as: ADVAIR Inhale 1 puff into the lungs 2 (two) times daily.   folic acid  1 MG tablet Commonly known as: FOLVITE  Take 1 tablet (1 mg total) by mouth daily.   glucose blood test strip Use as instructed   ibuprofen  800 MG tablet Commonly known as: ADVIL  Take 800 mg by mouth every 6 (six) hours as needed. for pain   Incruse Ellipta  62.5 MCG/ACT Aepb Generic drug: umeclidinium bromide  TAKE 1 PUFF BY MOUTH EVERY DAY   Lancet Device Misc 1 Device by Does not apply route daily.   levothyroxine  112 MCG tablet Commonly known as: SYNTHROID  Take 1 tablet (112 mcg total) by mouth daily before breakfast.   ondansetron  4 MG disintegrating tablet Commonly known as: Zofran  ODT Take 1 tablet (4 mg total) by mouth every 8 (eight) hours as needed for nausea or vomiting.   pantoprazole  40 MG tablet Commonly known as: PROTONIX  TAKE 1 TABLET BY MOUTH EVERY DAY   senna-docusate 8.6-50 MG tablet Commonly known as: Senokot-S Take by mouth.          OBJECTIVE:   PHYSICAL EXAM: VS: BP 126/84 (BP Location: Left Arm, Patient Position: Sitting, Cuff Size: Normal)   Pulse 81   Ht 5' 3 (1.6 m)   Wt 273 lb 9.6 oz (124.1 kg)   SpO2 99%   BMI 48.47 kg/m    EXAM: General: Pt appears well and is in NAD  Neck:  Thyroid : No goiter or nodules appreciated.  Lungs: Clear with good BS bilat   Heart:  Auscultation: RRR.  Extremities:  BL LE: No pretibial edema   Mental Status: Judgment, insight: Intact Orientation: Oriented to time, place, and person Mood and affect: No depression, anxiety, or agitation   Dm Foot Exam 02/21/2024  The skin of the feet is intact without sores or ulcerations. The pedal pulses are 2+ on right and 2+ on left. The sensation is intact to a screening 5.07, 10 gram monofilament bilaterally   DATA REVIEWED:   Latest Reference Range & Units 05/19/24  11:37  Sodium 134 - 144 mmol/L 142  Potassium 3.5 - 5.2 mmol/L 4.2  Chloride 96 - 106 mmol/L 103  CO2 20 - 29 mmol/L 25  Glucose 70 - 99 mg/dL 890 (H)  BUN 6 - 24 mg/dL 10  Creatinine 9.42 - 8.99 mg/dL 9.08  Calcium  8.7 - 10.2 mg/dL 9.6  BUN/Creatinine Ratio 9 - 23  11  eGFR >59 mL/min/1.73 78  (H): Data is abnormally high   Thyroid  Ultrasound 04/14/2022  Nodule # 1:   Prior biopsy: No   Location: Left; mid   Maximum size: 1.4 cm; Other 2 dimensions: 0.8 x 0.6 cm, previously, 2.5 x 1.7 x 1.7 cm   Composition: solid/almost completely solid (2)   Echogenicity: isoechoic (1)   Shape: not taller-than-wide (0)   Margins: ill-defined (0)   Echogenic foci: none (0)   ACR TI-RADS total points: 3.   ACR TI-RADS risk category:  TR3 (3 points).   Significant change in size (>/= 20% in two dimensions and minimal increase of 2 mm): No   Change in features: No   Change in ACR TI-RADS risk category: No   ACR TI-RADS recommendations:   Given decrease in size since 2017, this is consistent with a benign nodule.   _________________________________________________________   IMPRESSION: Nodule 1 (TI-RADS 3), located in the mid left thyroid  lobe demonstrates decrease in size since 2017, consistent with a benign etiology. It does not require further imaging follow-up.   The above is in keeping with the ACR TI-RADS recommendations - J Am Coll Radiol 2017;14:587-595.      ASSESSMENT / PLAN /  RECOMMENDATIONS:    1.Postablative Hypothyroidism:  -Patient is clinically euthyroid -No local neck symptoms - TFT's are within normal range, no change   Medications   Continue levothyroxine  112 mcg daily   2.Type 2 Diabetes Mellitus, Optimally controlled, Without complications - Most recent A1c of 6.8 %. Goal A1c < 7.0 %.     - A1c has trended up from 6.2% to 6.8% - The patient was on Mounjaro  but had to discontinue due to loss of health insurance.  She eventually opted to remain off glycemic agents -In the interim, she saw her PCP and discussed weight loss medication, patient is interested in [place].  Patient was referred for sleep study through PCPs office and is pending review - I have recommended trying Ozempic , caution against GI side effects, patient in agreement    MEDICATIONS: Start Ozempic  0.25 mg weekly for 6 weeks, then increase to 0.5 mg weekly     Follow-up in 6 months  Addendum: Discussed elevated A1c with the patient over the phone on 08/23/2024 at 12:50 PM.  I have recommended Ozempic  since she is interested in [place] for weight loss purposes  Signed electronically by: Stefano Redgie Butts, MD  [facility] Endocrinology  [facility] [address] [place], [place] [address] Phone: [phone] FAX: [phone]      CC: Tharon Lung, MD [address] [place] [address] Phone: [phone]  Fax: [phone]   Return to Endocrinology clinic as below: No future appointments.

## 2024-08-23 ENCOUNTER — Ambulatory Visit: Payer: Self-pay | Admitting: Internal Medicine

## 2024-08-23 LAB — TSH: TSH: 1.2 m[IU]/L

## 2024-08-23 LAB — HEMOGLOBIN A1C
Hgb A1c MFr Bld: 6.8 % — ABNORMAL HIGH (ref <5.7)
Mean Plasma Glucose: 148 mg/dL
eAG (mmol/L): 8.2 mmol/L

## 2024-08-23 LAB — T4, FREE: Free T4: 1.7 ng/dL (ref 0.8–1.8)

## 2024-08-23 MED ORDER — LEVOTHYROXINE SODIUM 112 MCG PO TABS: 112.0000 ug | ORAL_TABLET | Freq: Every day | ORAL | 3 refills | Status: AC

## 2024-08-23 MED ORDER — OZEMPIC (0.25 OR 0.5 MG/DOSE) 2 MG/3ML ~~LOC~~ SOPN: 0.5000 mg | PEN_INJECTOR | SUBCUTANEOUS | 3 refills | Status: DC

## 2024-09-04 ENCOUNTER — Other Ambulatory Visit (HOSPITAL_COMMUNITY): Payer: Self-pay

## 2024-09-04 ENCOUNTER — Telehealth: Payer: Self-pay

## 2024-09-04 NOTE — Telephone Encounter (Signed)
 Pharmacy Patient Advocate Encounter   Received notification from CoverMyMeds that prior authorization for Ozempic  (0.25 or 0.5 MG/DOSE) 2MG /3ML pen-injectors is required/requested.   Insurance verification completed.   The patient is insured through [facility] .   Per test claim: PA required; PA submitted to above mentioned insurance via Latent Key/confirmation #/EOC [id number] Status is pending

## 2024-09-08 NOTE — Telephone Encounter (Signed)
 Pharmacy Patient Advocate Encounter  Received notification from [employer] that Prior Authorization for Ozempic  (0.25 or 0.5 MG/DOSE) 2MG /3ML pen-injectors has been DENIED.  See denial reason below. No denial letter attached in CMM. Will attach denial letter to Media tab once received.   PA #/Case ID/Reference #: [id number]

## 2024-09-11 NOTE — Telephone Encounter (Signed)
 Resubmitted. Key: [id number]

## 2024-09-13 NOTE — Telephone Encounter (Signed)
 Pharmacy Patient Advocate Encounter  Received notification from [employer] that Prior Authorization for Ozempic  (0.25 or 0.5 MG/DOSE) 2MG /3ML pen-injectors has been APPROVED from 09/11/24 to 09/11/25   PA #/Case ID/Reference #: [id number]

## 2024-10-16 ENCOUNTER — Other Ambulatory Visit: Payer: Self-pay | Admitting: Family Medicine

## 2024-11-25 ENCOUNTER — Ambulatory Visit (HOSPITAL_COMMUNITY)

## 2024-11-25 ENCOUNTER — Ambulatory Visit (HOSPITAL_COMMUNITY)
Admission: EM | Admit: 2024-11-25 | Discharge: 2024-11-25 | Disposition: A | Discharge status: Home / Self Care | Attending: Emergency Medicine | Admitting: Emergency Medicine

## 2024-11-25 ENCOUNTER — Encounter (HOSPITAL_COMMUNITY): Payer: Self-pay

## 2024-11-25 DIAGNOSIS — S6991XA Unspecified injury of right wrist, hand and finger(s), initial encounter: Secondary | ICD-10-CM | POA: Diagnosis not present

## 2024-11-25 MED ORDER — IBUPROFEN 800 MG PO TABS
ORAL_TABLET | ORAL | Status: AC
  Filled 2024-11-25: qty 1

## 2024-11-25 MED ORDER — IBUPROFEN 800 MG PO TABS
800.0000 mg | ORAL_TABLET | Freq: Once | ORAL | Status: AC
  Administered 2024-11-25: 800 mg via ORAL

## 2024-11-25 NOTE — Discharge Instructions (Addendum)
 Imaging did not reveal any fractures of the fingertips.  Keep the hand elevated throughout tonight and continue to ice it to help with swelling.  There is significant bruising, you may have blood pooling under the nail.  You can alternate between 600 mg of ibuprofen  and 500 mg of Tylenol  every 4-6 hours to help with pain and swelling.  If the pain worsens or changes please follow-up with an orthopedic for further evaluation.  Return to clinic for any new or urgent symptoms.

## 2024-11-25 NOTE — ED Provider Notes (Signed)
 [facility]    CSN: [id number] Arrival date & time: 11/25/24  1807      History   Chief Complaint Chief Complaint  Patient presents with   Hand Injury    HPI Anna Hawkins is a 48 y.o. female.   Patient was attempting to shut her garage door manually and pull it down when her 49 year old decided to assist her.  Tips of 2nd, 3rd and 4th finger on the right hand got caught in the garage panel.  Immediately afterwards patient has had swelling, pain and bruising.    The history is provided by the patient and medical records.  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 08/31/2022   Moderate persistent asthma 08/31/2022   Multinodular goiter 07/13/2018   Hypothyroidism 01/19/2017   Allergic rhinitis 02/03/2016   Type 2 diabetes mellitus without complication 09/02/2015   Essential hypertension 10/29/2014   Morbid obesity (HCC) 10/19/2011    Past Surgical History:  Procedure Laterality Date   LAPAROSCOPIC TUBAL LIGATION  10/29/2011   Procedure: LAPAROSCOPIC TUBAL LIGATION;  Surgeon: Winton Felt, MD;  Location: [facility];  Service: Gynecology;  Laterality: Bilateral;   SLEEVE GASTROPLASTY     THERAPEUTIC ABORTION     WISDOM TOOTH EXTRACTION      OB History     Gravida  6   Para  2   Term  2   Preterm  0   AB  4   Living  2      SAB  2   IAB  2   Ectopic  0   Multiple  0   Live Births               Home Medications    Prior to Admission medications   Medication Sig Start  Date End Date Taking? Authorizing Provider  ADVAIR DISKUS 100-50 MCG/ACT AEPB INHALE 1 PUFF INTO THE LUNGS TWICE A DAY 10/16/24  Yes Howell Lunger, DO  blood glucose meter kit and supplies KIT Dispense based on patient and insurance preference. Use up to four times daily as directed. (FOR ICD-9 250.00, 250.01). 10/12/16  Yes Rumley, [place] N, DO  cetirizine  (ZYRTEC ) 10 MG tablet Take 1 tablet (10 mg total) by mouth daily. 09/14/23  Yes Theophilus Pagan, MD  folic acid  (FOLVITE ) 1 MG tablet Take 1 tablet (1 mg total) by mouth daily. 12/08/21  Yes Franchot Lauraine HERO, NP  glucose blood test strip Use as instructed 12/04/15  Yes Rumley, [place] N, DO  INCRUSE ELLIPTA  62.5 MCG/ACT AEPB TAKE 1 PUFF BY MOUTH EVERY DAY 07/11/24  Yes Tharon Lung, MD  Lancet Device MISC 1 Device by Does not apply route daily. 12/27/14  Yes Rumley, [place], DO  Lancets (ACCU-CHEK MULTICLIX) lancets Use as instructed 12/04/15  Yes Rumley, [place] N, DO  levothyroxine  (SYNTHROID ) 112 MCG tablet Take 1 tablet (112 mcg total) by mouth daily before breakfast. 08/23/24  Yes Shamleffer, Ibtehal Jaralla, MD  pantoprazole  (PROTONIX ) 40 MG  tablet TAKE 1 TABLET BY MOUTH EVERY DAY 06/19/24  Yes Mabe, Elna, MD  Semaglutide ,0.25 or 0.5MG /DOS, (OZEMPIC , 0.25 OR 0.5 MG/DOSE,) 2 MG/3ML SOPN Inject 0.5 mg into the skin once a week. 08/23/24  Yes Shamleffer, Ibtehal Jaralla, MD  albuterol  (VENTOLIN  HFA) 108 (90 Base) MCG/ACT inhaler Inhale 2 puffs into the lungs every 6 (six) hours as needed for wheezing or shortness of breath. 08/31/22   Delores Suzann HERO, MD  fluticasone  (FLONASE ) 50 MCG/ACT nasal spray Place 2 sprays into both nostrils daily. 08/31/22   Delores Suzann HERO, MD  fluticasone  (FLONASE ) 50 MCG/ACT nasal spray 2 sprays daily as needed for rhinitis. 02/03/16   [provider]  ibuprofen  (ADVIL ) 800 MG tablet Take 800 mg by mouth every 6 (six) hours as needed. for pain 01/26/23   [provider]  ondansetron  (ZOFRAN  ODT) 4 MG  disintegrating tablet Take 1 tablet (4 mg total) by mouth every 8 (eight) hours as needed for nausea or vomiting. 10/22/19   Wieters, Hallie C, PA-C  senna-docusate (SENOKOT-S) 8.6-50 MG tablet Take by mouth. 09/28/18   [provider]  hydrochlorothiazide  (HYDRODIURIL ) 25 MG tablet TAKE 1 TABLET BY MOUTH EVERY DAY 12/22/18 08/07/20  Rosalynn Camie CROME, MD    Family History Family History  Problem Relation Age of Onset   Diabetes Mother    Hypertension Mother    Asthma Mother    Thyroid  disease Mother    Diabetes Father    Cancer Father    Asthma Father    Hypertension Father    Diabetes Brother    Hypertension Brother    Asthma Brother    Colon cancer Neg Hx    Esophageal cancer Neg Hx    Stomach cancer Neg Hx    Rectal cancer Neg Hx     Social History Social History   Tobacco Use   Smoking status: Never   Smokeless tobacco: Never  Vaping Use   Vaping status: Never Used  Substance Use Topics   Alcohol use: No   Drug use: No     Allergies   Lisinopril   Review of Systems Review of Systems  Per HPI  Physical Exam Triage Vital Signs ED Triage Vitals  Encounter Vitals Group     BP 11/25/24 1845 (!) 144/79     Girls Systolic BP Percentile --      Girls Diastolic BP Percentile --      Boys Systolic BP Percentile --      Boys Diastolic BP Percentile --      Pulse Rate 11/25/24 1845 75     Resp 11/25/24 1845 18     Temp 11/25/24 1847 98 F (36.7 C)     Temp src --      SpO2 11/25/24 1845 94 %     Weight --      Height --      Head Circumference --      Peak Flow --      Pain Score 11/25/24 1846 5     Pain Loc --      Pain Education --      Exclude from Growth Chart --    No data found.  Updated Vital Signs BP (!) 144/79 (BP Location: Left Arm)   Pulse 75   Temp 98 F (36.7 C)   Resp 18   SpO2 94%   Visual Acuity Right Eye Distance:   Left Eye Distance:   Bilateral Distance:    Right Eye Near:  Left Eye Near:    Bilateral Near:      Physical Exam Vitals and nursing note reviewed.  Constitutional:      Appearance: Normal appearance.  HENT:     Head: Normocephalic and atraumatic.     Right Ear: External ear normal.     Left Ear: External ear normal.     Nose: Nose normal.     Mouth/Throat:     Mouth: Mucous membranes are moist.  Eyes:     Conjunctiva/sclera: Conjunctivae normal.  Cardiovascular:     Rate and Rhythm: Normal rate.     Pulses: Normal pulses.  Pulmonary:     Effort: Pulmonary effort is normal. No respiratory distress.  Musculoskeletal:        General: Swelling, tenderness and signs of injury present.  Skin:    General: Skin is warm and dry.     Capillary Refill: Capillary refill takes less than 2 seconds.     Findings: Bruising present.  Neurological:     General: No focal deficit present.     Mental Status: She is alert and oriented to person, place, and time.  Psychiatric:        Mood and Affect: Mood normal.        Behavior: Behavior normal.      UC Treatments / Results  Labs (all labs ordered are listed, but only abnormal results are displayed) Labs Reviewed - No data to display  EKG   Radiology DG Hand Complete Right Result Date: 11/25/2024 EXAM: 3 OR MORE VIEW(S) XRAY OF THE RIGHT HAND 11/25/2024 07:16:11 PM COMPARISON: None available. CLINICAL HISTORY: Shut tips of 2nd, 3rd and 4th finger in garage door. FINDINGS: BONES AND JOINTS: No acute fracture. No malalignment. SOFT TISSUES: The soft tissues are unremarkable. IMPRESSION: 1. No significant abnormality. Electronically signed by: Franky Crease MD 11/25/2024 07:20 PM EST RP Workstation: [id number]    Procedures Procedures (including critical care time)  Medications Ordered in UC Medications  ibuprofen  (ADVIL ) tablet 800 mg (800 mg Oral Given 11/25/24 1940)    Initial Impression / Assessment and Plan / UC Course  I have reviewed the triage vital signs and the nursing notes.  Pertinent labs & imaging results that were  available during my care of the patient were reviewed by me and considered in my medical decision making (see chart for details).  Vitals and triage reviewed, patient is hemodynamically stable.  Brisk capillary refill in all fingers of the right hand.  Obvious bruising and swelling to the 2nd, 3rd and 4th fingers.  Some blood starting to pull underneath these nails.  Without breaks in the skin, low clinical concern for infection.  Imaging by my interpretation does not show acute bony abnormality, confirmed with radiology overread.  Suspect soft tissue injury, symptomatic management discussed.  Plan of care, follow-up care return precautions given, no questions at this time.  Work note provided.    Final Clinical Impressions(s) / UC Diagnoses   Final diagnoses:  Injury of right hand, initial encounter     Discharge Instructions      Imaging did not reveal any fractures of the fingertips.  Keep the hand elevated throughout tonight and continue to ice it to help with swelling.  There is significant bruising, you may have blood pooling under the nail.  You can alternate between 600 mg of ibuprofen  and 500 mg of Tylenol  every 4-6 hours to help with pain and swelling.  If the pain worsens or changes please follow-up with  an orthopedic for further evaluation.  Return to clinic for any new or urgent symptoms.     ED Prescriptions   None    PDMP not reviewed this encounter.   Dreama, Mylz Yuan  N, FNP 11/25/24 1953

## 2024-11-25 NOTE — ED Triage Notes (Signed)
 Patient presents to the office for L-hand injury. Patient states her L hand was slammed in her garage this afternoon.

## 2024-11-26 ENCOUNTER — Other Ambulatory Visit: Payer: Self-pay | Admitting: Family Medicine

## 2024-12-29 ENCOUNTER — Other Ambulatory Visit (HOSPITAL_COMMUNITY): Payer: Self-pay

## 2024-12-29 ENCOUNTER — Telehealth: Payer: Self-pay | Admitting: Pharmacy Technician

## 2024-12-29 NOTE — Telephone Encounter (Signed)
 Pharmacy Patient Advocate Encounter   Received notification from [facility] KEY that prior authorization for Ozempic  (0.25 or 0.5 MG/DOSE) 2MG /3ML pen-injectors  is required/requested.   Insurance verification completed.   The patient is insured through [facility].   Per test claim: PA required; PA submitted to above mentioned insurance via Latent Key/confirmation #/EOC [id number] Status is pending

## 2025-01-03 ENCOUNTER — Other Ambulatory Visit (HOSPITAL_COMMUNITY): Payer: Self-pay

## 2025-01-03 NOTE — Telephone Encounter (Signed)
 Pharmacy Patient Advocate Encounter  Received notification from [facility] that Prior Authorization for Ozempic  (0.25 or 0.5 MG/DOSE) 2MG /3ML pen-injectors  has been APPROVED from 12/29/24 to 12/29/25. Ran test claim, Copay is $49.99. This test claim was processed through [facility]- copay amounts may vary at other pharmacies due to pharmacy/plan contracts, or as the patient moves through the different stages of their insurance plan.   PA #/Case ID/Reference #: [id number]

## 2025-01-04 ENCOUNTER — Other Ambulatory Visit (HOSPITAL_COMMUNITY): Payer: Self-pay

## 2025-01-05 ENCOUNTER — Encounter: Payer: Self-pay | Admitting: Internal Medicine

## 2025-01-05 MED ORDER — OZEMPIC (0.25 OR 0.5 MG/DOSE) 2 MG/3ML ~~LOC~~ SOPN: 0.5000 mg | PEN_INJECTOR | SUBCUTANEOUS | 3 refills | Status: AC

## 2025-01-08 ENCOUNTER — Encounter: Payer: Self-pay | Admitting: Family Medicine

## 2025-01-08 DIAGNOSIS — G4733 Obstructive sleep apnea (adult) (pediatric): Secondary | ICD-10-CM | POA: Insufficient documentation

## 2025-01-09 ENCOUNTER — Ambulatory Visit (INDEPENDENT_AMBULATORY_CARE_PROVIDER_SITE_OTHER): Payer: Self-pay | Admitting: Family Medicine

## 2025-01-09 VITALS — BP 134/82 | HR 84 | Ht 64.0 in | Wt 258.2 lb

## 2025-01-09 DIAGNOSIS — F39 Unspecified mood [affective] disorder: Secondary | ICD-10-CM | POA: Diagnosis not present

## 2025-01-09 DIAGNOSIS — Z1231 Encounter for screening mammogram for malignant neoplasm of breast: Secondary | ICD-10-CM

## 2025-01-09 DIAGNOSIS — E119 Type 2 diabetes mellitus without complications: Secondary | ICD-10-CM

## 2025-01-09 LAB — POCT GLYCOSYLATED HEMOGLOBIN (HGB A1C): HbA1c, POC (controlled diabetic range): 5.7 % (ref 0.0–7.0)

## 2025-01-09 MED ORDER — SERTRALINE HCL 50 MG PO TABS: 50.0000 mg | ORAL_TABLET | Freq: Every day | ORAL | 0 refills | Status: AC

## 2025-01-09 MED ORDER — HYDROXYZINE HCL 25 MG PO TABS: 25.0000 mg | ORAL_TABLET | Freq: Three times a day (TID) | ORAL | 0 refills | Status: AC | PRN

## 2025-01-09 NOTE — Assessment & Plan Note (Signed)
 Has access to Ozempic  now. Continue this at 0.5 mg weekly given A1c is 5.7 today. Congratulated on success. UACR pending. Will need lipid panel at next visit.

## 2025-01-09 NOTE — Progress Notes (Signed)
 "   SUBJECTIVE:   Chief compliant/HPI: annual examination  Anna Hawkins is a 49 y.o. who presents today for an annual exam.   Discussed the use of AI scribe software for clinical note transcription with the patient, who gave verbal consent to proceed.  History of Present Illness Anna Hawkins is a 49 year old female who presents for physical exam with insomnia and headaches following recent bereavement.  Insomnia and sleep disturbance - Marked insomnia since 07/28/25 following her mother's death and her brother's death in 01-28-2025 - Difficulty falling and staying asleep for the past six months - Mind races at night - Sleep remains significantly disrupted - Also with headaches during this period - Prior sleep study did not qualify her for CPAP per her report, but she was diagnosed with moderate OSA - No current use of psychiatric medications - Recalls benefit from an anxiety or antidepressant medication after a brother's death in January 29, 1996, but does not recall the specific medication  Emotional distress and grief - Emotional instability and high stress related to bereavement and 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 - Worried about physical health while coping with grief - Managing several properties contributing to stress - Denies suicidal or homicidal thoughts - Denies diagnosis of bipolar disorder or true manic symptoms  Endocrine and metabolic health - Consistent use of thyroid  medication - Prescribed Ozempic  but unable to use regularly since December due to pharmacy and insurance issues - Trying to continue lifestyle management  Musculoskeletal injury - Recent injury with fingers slammed in a garage door - Persistent soreness and pressure in fingers - Urgent care evaluation with negative x-rays for major injury   OBJECTIVE:   BP 134/82   Pulse 84   Ht 5' 4 (1.626 m)   Wt 258 lb 3.2 oz (117.1 kg)   SpO2 97%   BMI 44.32 kg/m   General: Alert and  oriented, in NAD Skin: Warm, dry, and intact HEENT: NCAT, EOM grossly normal, midline nasal septum Respiratory: Breathing and speaking comfortably on RA Extremities: Moves all extremities grossly equally; subungual hematomas of third and fourth digits, mild TTP over finger pads of these fingers, no difficulties with ROM, no deformities/swelling/erythema Neurological: No gross focal deficit Psychiatric: Appropriate mood and affect for situation  ASSESSMENT/PLAN:   Assessment & Plan Type 2 diabetes mellitus without complication, without long-term current use of insulin  (HCC) Has access to Ozempic  now. Continue this at 0.5 mg weekly given A1c is 5.7 today. Congratulated on success. UACR pending. Will need lipid panel at next visit. Mood disorder Given recent stressors, will start Zoloft  50 mg daily. Will also add hydroxyzine  25 mg at bedtime prn to help with racing anxious thoughts. Therapy resources provided. Reassuring no SI/HI today. She will follow up with me in 1 month to further discuss or sooner if mood is worsening before then.  Annual Examination  See AVS for recommendations.  Blood pressure value is at goal.   Considered the following screening exams based upon USPSTF recommendations: Will discuss STI screening as desired when she returns soon for her pap smear.  Cancer Screening Discussion  Colorectal cancer screening: up to date on screening for CRC. if age 38 or older. She will need her next CRC screening in 28-Jan-2029 (7 years duration given precancerous polyp in 2022-01-28). Vaccinations UTD.  Breast cancer screening: ordered.  Follow up in 1 year or sooner if indicated.  MyChart Activation: Already signed up  Stuart Redo, MD [facility]   "

## 2025-01-09 NOTE — Assessment & Plan Note (Signed)
 Given recent stressors, will start Zoloft  50 mg daily. Will also add hydroxyzine  25 mg at bedtime prn to help with racing anxious thoughts. Therapy resources provided. Reassuring no SI/HI today. She will follow up with me in 1 month to further discuss or sooner if mood is worsening before then.

## 2025-01-09 NOTE — Patient Instructions (Addendum)
 I have sent in Zoloft  to take daily along with hydroxyzine . Let me know how you do on these medications and STOP them if not sleeping, behavior changes, or too much sedation. Come back in 1 month to reassess. Therapy resources below.  We will also do your pap smear at the next visit. I have referred you for mammogram; you can call and schedule this at the [facility] whenever you feel up to it.   Therapy and Counseling Resources Most providers on this list will take Medicaid. Patients with commercial insurance or Medicare should contact their insurance company to get a list of in network providers.  [facility] (takes children) Location 1: [address], [address] [place], [place] [address] Location 2: [address] [place], [place] [address] [phone]   [facility] (spanish speaking therapist available)(habla espanol)(take medicare and medicaid)  [address], [place], [place] [address], [place]  [email or web address] [phone]  [facility] [address] [place], [place] [address] [phone]  [facility]   [address], Suite [place], [place] [address]      [phone]  [facility] (spanish available) [address]  [place], [place] [address] [phone]  [facility] (take [facility] and medicare) [address]  [place], [place] [address]       [phone]     [facility] (virtual only) [phone]  [facility] [address] [place], [place] [phone]  [facility]:  [address] [place] [phone]  [facility]:  [address] [place] [phone]  [facility] (under & uninsured) [address]   [place] [phone]    [email or web address]    [facility] [address]  [place]    [phone]  [facility] -[address]     Phone:  [phone]     [facility]-  [address]  [phone]   [facility] [address]      [place], [place] [phone]  [facility]: [address] [place], [place], [place]  [phone]   [facility] (Spanish therapist) [email or web address]  [address] [place]  [address]   [place] [address]    [phone]    [facility]   [address],  [place], [place]  [phone]   [facility]  [address] [place]  [phone]  [facility]  [address] [place], [place]        [phone]  Open Access/Walk In Clinic under & uninsured  [facility]  [address] [place], [place] [phone] Crisis [phone]  [facility] [address],  (Spanish)   [address] , [place]: [phone]) 8:30 - 12; 1 - 2:30  [facility] [employer],  [address] [place], [place]    [phone]):8:30 - 12; 2 - 3PM  [facility] [employer],  [address],  [place]; [phone]):   Mon - Fri 8 AM - 5 PM  [facility] [address] [place]  MWF 12:30 to 3:00 or call to schedule an appointment  [phone]  Specific Provider options Psychology Today  [email or web address]/us  click on find a therapist  enter your zip code left side and select or tailor a therapist  for your specific need.   [facility] Provider Directory [email or web address]  (Medicaid)   Follow all drop down to find a provider  Social Support program [facility] [place] [phone] or photosolver.pl [address], [place], [place] Recovery support and educational   24- Hour Availability:   [facility]  [address] [place], [place] [phone] Crisis  [phone]  [facility] [phone]  [place] [facility]  [phone]   [facility]  [phone] (after hours)  Therapeutic Alternative/Mobile Crisis   [phone]  [place]  National Suicide Hotline  201 781 0076 MERRILYN)  Call 911 or go to emergency room  [facility]  [phone]);  [place]  [phone]); [place], [place], [place], [place], [place], [place], [place]

## 2025-01-10 ENCOUNTER — Ambulatory Visit: Payer: Self-pay | Admitting: Family Medicine

## 2025-01-10 LAB — MICROALBUMIN / CREATININE URINE RATIO
Creatinine, Urine: 300.1 mg/dL
Microalb/Creat Ratio: 5 mg/g{creat} (ref 0–29)
Microalbumin, Urine: 14.8 ug/mL

## 2025-02-08 ENCOUNTER — Ambulatory Visit

## 2025-08-20 ENCOUNTER — Ambulatory Visit: Admitting: Internal Medicine
